# Patient Record
Sex: Female | Born: 1985 | Race: Black or African American | Hispanic: No | Marital: Married | Smoking: Never smoker
Health system: Southern US, Community
[De-identification: ages and names within clinical notes are randomized; demographics above are authoritative.]

## PROBLEM LIST (undated history)

## (undated) DIAGNOSIS — B379 Candidiasis, unspecified: Secondary | ICD-10-CM

## (undated) DIAGNOSIS — E119 Type 2 diabetes mellitus without complications: Secondary | ICD-10-CM

## (undated) DIAGNOSIS — N760 Acute vaginitis: Secondary | ICD-10-CM

## (undated) DIAGNOSIS — Z862 Personal history of diseases of the blood and blood-forming organs and certain disorders involving the immune mechanism: Secondary | ICD-10-CM

## (undated) DIAGNOSIS — K219 Gastro-esophageal reflux disease without esophagitis: Secondary | ICD-10-CM

## (undated) DIAGNOSIS — J189 Pneumonia, unspecified organism: Secondary | ICD-10-CM

## (undated) DIAGNOSIS — R112 Nausea with vomiting, unspecified: Secondary | ICD-10-CM

## (undated) DIAGNOSIS — B9620 Unspecified Escherichia coli [E. coli] as the cause of diseases classified elsewhere: Secondary | ICD-10-CM

## (undated) DIAGNOSIS — Q059 Spina bifida, unspecified: Secondary | ICD-10-CM

## (undated) DIAGNOSIS — N920 Excessive and frequent menstruation with regular cycle: Secondary | ICD-10-CM

## (undated) DIAGNOSIS — L409 Psoriasis, unspecified: Secondary | ICD-10-CM

## (undated) DIAGNOSIS — G7000 Myasthenia gravis without (acute) exacerbation: Secondary | ICD-10-CM

## (undated) DIAGNOSIS — Z87440 Personal history of urinary (tract) infections: Secondary | ICD-10-CM

## (undated) DIAGNOSIS — N189 Chronic kidney disease, unspecified: Secondary | ICD-10-CM

## (undated) DIAGNOSIS — N390 Urinary tract infection, site not specified: Secondary | ICD-10-CM

## (undated) DIAGNOSIS — I10 Essential (primary) hypertension: Secondary | ICD-10-CM

## (undated) DIAGNOSIS — A599 Trichomoniasis, unspecified: Secondary | ICD-10-CM

## (undated) DIAGNOSIS — B9689 Other specified bacterial agents as the cause of diseases classified elsewhere: Secondary | ICD-10-CM

## (undated) DIAGNOSIS — N938 Other specified abnormal uterine and vaginal bleeding: Secondary | ICD-10-CM

## (undated) DIAGNOSIS — N898 Other specified noninflammatory disorders of vagina: Secondary | ICD-10-CM

## (undated) DIAGNOSIS — Z9889 Other specified postprocedural states: Secondary | ICD-10-CM

## (undated) DIAGNOSIS — E876 Hypokalemia: Secondary | ICD-10-CM

## (undated) HISTORY — DX: Other specified abnormal uterine and vaginal bleeding: N93.8

## (undated) HISTORY — DX: Gastro-esophageal reflux disease without esophagitis: K21.9

## (undated) HISTORY — DX: Unspecified Escherichia coli (E. coli) as the cause of diseases classified elsewhere: B96.20

## (undated) HISTORY — PX: ADENOIDECTOMY: SUR15

## (undated) HISTORY — PX: EYE SURGERY: SHX253

## (undated) HISTORY — DX: Spina bifida, unspecified: Q05.9

## (undated) HISTORY — DX: Urinary tract infection, site not specified: N39.0

## (undated) HISTORY — DX: Trichomoniasis, unspecified: A59.9

## (undated) HISTORY — DX: Other specified noninflammatory disorders of vagina: N89.8

## (undated) HISTORY — DX: Essential (primary) hypertension: I10

## (undated) HISTORY — PX: DILATION AND CURETTAGE OF UTERUS: SHX78

## (undated) HISTORY — PX: BACK SURGERY: SHX140

## (undated) HISTORY — DX: Acute vaginitis: N76.0

## (undated) HISTORY — DX: Other specified bacterial agents as the cause of diseases classified elsewhere: B96.89

## (undated) HISTORY — DX: Excessive and frequent menstruation with regular cycle: N92.0

## (undated) HISTORY — PX: FOOT SURGERY: SHX648

## (undated) HISTORY — DX: Hypokalemia: E87.6

## (undated) HISTORY — DX: Candidiasis, unspecified: B37.9

## (undated) HISTORY — DX: Personal history of diseases of the blood and blood-forming organs and certain disorders involving the immune mechanism: Z86.2

## (undated) HISTORY — PX: TONSILLECTOMY: SUR1361

---

## 2006-03-02 ENCOUNTER — Ambulatory Visit: Payer: Self-pay | Admitting: Podiatry

## 2008-02-10 ENCOUNTER — Ambulatory Visit: Payer: Self-pay | Admitting: Internal Medicine

## 2008-05-23 ENCOUNTER — Emergency Department: Payer: Self-pay | Admitting: Unknown Physician Specialty

## 2008-05-24 ENCOUNTER — Emergency Department: Payer: Self-pay | Admitting: Emergency Medicine

## 2009-06-05 ENCOUNTER — Emergency Department: Payer: Self-pay | Admitting: Unknown Physician Specialty

## 2009-07-18 ENCOUNTER — Emergency Department: Payer: Self-pay | Admitting: Emergency Medicine

## 2010-12-06 DIAGNOSIS — G7000 Myasthenia gravis without (acute) exacerbation: Secondary | ICD-10-CM | POA: Insufficient documentation

## 2011-05-15 DIAGNOSIS — I10 Essential (primary) hypertension: Secondary | ICD-10-CM | POA: Insufficient documentation

## 2011-10-02 DIAGNOSIS — E559 Vitamin D deficiency, unspecified: Secondary | ICD-10-CM | POA: Insufficient documentation

## 2012-12-25 HISTORY — PX: HYSTEROSCOPY WITH D & C: SHX1775

## 2013-02-24 ENCOUNTER — Ambulatory Visit: Payer: Self-pay

## 2013-02-24 LAB — COMPREHENSIVE METABOLIC PANEL
Albumin: 2.9 g/dL — ABNORMAL LOW (ref 3.4–5.0)
Alkaline Phosphatase: 68 U/L (ref 50–136)
Anion Gap: 9 (ref 7–16)
BUN: 13 mg/dL (ref 7–18)
Bilirubin,Total: 0.8 mg/dL (ref 0.2–1.0)
Co2: 30 mmol/L (ref 21–32)
EGFR (Non-African Amer.): >60
Glucose: 136 mg/dL — ABNORMAL HIGH (ref 65–99)
Potassium: 2.2 mmol/L — CL (ref 3.5–5.1)
SGOT(AST): 13 U/L — ABNORMAL LOW (ref 15–37)
Sodium: 132 mmol/L — ABNORMAL LOW (ref 136–145)

## 2013-02-24 LAB — CBC WITH DIFFERENTIAL/PLATELET
Lymphocytes: 7 %
MCHC: 32.5 g/dL (ref 32.0–36.0)
Monocytes: 13 %
Platelet: 270 10*3/uL (ref 150–440)
RDW: 14.3 % (ref 11.5–14.5)
Segmented Neutrophils: 80 %
WBC: 17.4 10*3/uL — ABNORMAL HIGH (ref 3.6–11.0)

## 2013-02-24 LAB — URINALYSIS, COMPLETE
Bilirubin,UR: NEGATIVE
Ketone: NEGATIVE
Specific Gravity: 1.01 (ref 1.003–1.030)

## 2013-02-24 LAB — PREGNANCY, URINE: Pregnancy Test, Urine: NEGATIVE m[IU]/mL

## 2013-02-26 LAB — URINE CULTURE

## 2013-05-06 ENCOUNTER — Emergency Department: Payer: Self-pay | Admitting: Emergency Medicine

## 2013-06-25 ENCOUNTER — Emergency Department: Payer: Self-pay | Admitting: Emergency Medicine

## 2013-06-25 LAB — BASIC METABOLIC PANEL
Anion Gap: 7 (ref 7–16)
BUN: 11 mg/dL (ref 7–18)
Calcium, Total: 9 mg/dL (ref 8.5–10.1)
Creatinine: 0.92 mg/dL (ref 0.60–1.30)
EGFR (African American): >60
EGFR (Non-African Amer.): >60
Glucose: 223 mg/dL — ABNORMAL HIGH (ref 65–99)
Potassium: 3.1 mmol/L — ABNORMAL LOW (ref 3.5–5.1)

## 2013-06-25 LAB — CBC
HGB: 12.6 g/dL (ref 12.0–16.0)
MCHC: 32.2 g/dL (ref 32.0–36.0)
MCV: 74 fL — ABNORMAL LOW (ref 80–100)
Platelet: 434 10*3/uL (ref 150–440)
RDW: 15.5 % — ABNORMAL HIGH (ref 11.5–14.5)
WBC: 12.4 10*3/uL — ABNORMAL HIGH (ref 3.6–11.0)

## 2013-06-25 LAB — URINALYSIS, COMPLETE
Bilirubin,UR: NEGATIVE
Glucose,UR: >=500 mg/dL (ref 0–75)
Nitrite: NEGATIVE
Ph: 7 (ref 4.5–8.0)
RBC,UR: 1 /HPF (ref 0–5)
Squamous Epithelial: 2
WBC UR: 5 /HPF (ref 0–5)

## 2013-06-25 LAB — PROTIME-INR
INR: 1
Prothrombin Time: 13.3 secs (ref 11.5–14.7)

## 2013-08-12 DIAGNOSIS — K5900 Constipation, unspecified: Secondary | ICD-10-CM | POA: Insufficient documentation

## 2013-08-12 DIAGNOSIS — Q059 Spina bifida, unspecified: Secondary | ICD-10-CM | POA: Insufficient documentation

## 2013-08-21 ENCOUNTER — Ambulatory Visit: Payer: Self-pay | Admitting: Obstetrics and Gynecology

## 2013-08-21 LAB — BASIC METABOLIC PANEL
BUN: 12 mg/dL (ref 7–18)
Co2: 29 mmol/L (ref 21–32)
Creatinine: 0.8 mg/dL (ref 0.60–1.30)
Glucose: 117 mg/dL — ABNORMAL HIGH (ref 65–99)
Osmolality: 273 (ref 275–301)
Potassium: 3 mmol/L — ABNORMAL LOW (ref 3.5–5.1)
Sodium: 136 mmol/L (ref 136–145)

## 2013-08-21 LAB — CBC
HGB: 12.7 g/dL (ref 12.0–16.0)
MCHC: 32.9 g/dL (ref 32.0–36.0)
MCV: 76 fL — ABNORMAL LOW (ref 80–100)
RBC: 5.05 10*6/uL (ref 3.80–5.20)
RDW: 17 % — ABNORMAL HIGH (ref 11.5–14.5)

## 2013-08-21 LAB — HCG, QUANTITATIVE, PREGNANCY: Beta Hcg, Quant.: <1 m[IU]/mL — ABNORMAL LOW

## 2013-09-08 ENCOUNTER — Ambulatory Visit: Payer: Self-pay | Admitting: Obstetrics and Gynecology

## 2013-09-08 LAB — URINALYSIS, COMPLETE
Ph: 5 (ref 4.5–8.0)
Protein: NEGATIVE
RBC,UR: 12 /HPF (ref 0–5)
Specific Gravity: 1.015 (ref 1.003–1.030)
Squamous Epithelial: 24

## 2013-09-10 LAB — URINE CULTURE

## 2013-09-11 LAB — PATHOLOGY REPORT

## 2013-10-08 ENCOUNTER — Emergency Department: Payer: Self-pay | Admitting: Emergency Medicine

## 2013-11-10 ENCOUNTER — Emergency Department: Payer: Self-pay | Admitting: Emergency Medicine

## 2014-01-19 DIAGNOSIS — F32A Depression, unspecified: Secondary | ICD-10-CM | POA: Insufficient documentation

## 2014-01-19 DIAGNOSIS — F329 Major depressive disorder, single episode, unspecified: Secondary | ICD-10-CM | POA: Insufficient documentation

## 2014-01-19 DIAGNOSIS — R591 Generalized enlarged lymph nodes: Secondary | ICD-10-CM | POA: Insufficient documentation

## 2014-01-19 DIAGNOSIS — R599 Enlarged lymph nodes, unspecified: Secondary | ICD-10-CM | POA: Insufficient documentation

## 2014-02-04 ENCOUNTER — Ambulatory Visit: Payer: Self-pay | Admitting: Podiatry

## 2014-03-14 ENCOUNTER — Emergency Department: Payer: Self-pay | Admitting: Emergency Medicine

## 2014-03-14 LAB — URINALYSIS, COMPLETE
Bilirubin,UR: NEGATIVE
Glucose,UR: 150 mg/dL (ref 0–75)
KETONE: NEGATIVE
Nitrite: POSITIVE
Ph: 5 (ref 4.5–8.0)
RBC,UR: 81 /HPF (ref 0–5)
Specific Gravity: 1.018 (ref 1.003–1.030)
Squamous Epithelial: 13
WBC UR: 687 /HPF (ref 0–5)

## 2014-03-17 DIAGNOSIS — E119 Type 2 diabetes mellitus without complications: Secondary | ICD-10-CM | POA: Insufficient documentation

## 2014-04-18 ENCOUNTER — Emergency Department: Payer: Self-pay | Admitting: Emergency Medicine

## 2014-04-18 LAB — URINALYSIS, COMPLETE
Bilirubin,UR: NEGATIVE
Glucose,UR: >=500 mg/dL (ref 0–75)
Nitrite: NEGATIVE
PH: 6 (ref 4.5–8.0)
Protein: 30
RBC,UR: 12 /HPF (ref 0–5)
SPECIFIC GRAVITY: 1.009 (ref 1.003–1.030)
Squamous Epithelial: 4

## 2014-04-18 LAB — CBC
HCT: 42.9 % (ref 35.0–47.0)
HGB: 14 g/dL (ref 12.0–16.0)
MCH: 26 pg (ref 26.0–34.0)
MCHC: 32.8 g/dL (ref 32.0–36.0)
MCV: 80 fL (ref 80–100)
PLATELETS: 219 10*3/uL (ref 150–440)
RBC: 5.39 10*6/uL — ABNORMAL HIGH (ref 3.80–5.20)
RDW: 13.3 % (ref 11.5–14.5)
WBC: 11.3 10*3/uL — AB (ref 3.6–11.0)

## 2014-04-18 LAB — BASIC METABOLIC PANEL
ANION GAP: 10 (ref 7–16)
BUN: 8 mg/dL (ref 7–18)
CALCIUM: 8.9 mg/dL (ref 8.5–10.1)
CREATININE: 0.94 mg/dL (ref 0.60–1.30)
Chloride: 88 mmol/L — ABNORMAL LOW (ref 98–107)
Co2: 31 mmol/L (ref 21–32)
EGFR (African American): >60
EGFR (Non-African Amer.): >60
GLUCOSE: 269 mg/dL — AB (ref 65–99)
Osmolality: 267 (ref 275–301)
Potassium: 2.6 mmol/L — ABNORMAL LOW (ref 3.5–5.1)
SODIUM: 129 mmol/L — AB (ref 136–145)

## 2014-04-20 DIAGNOSIS — Z79899 Other long term (current) drug therapy: Secondary | ICD-10-CM | POA: Insufficient documentation

## 2014-04-20 DIAGNOSIS — Z7952 Long term (current) use of systemic steroids: Secondary | ICD-10-CM | POA: Insufficient documentation

## 2014-04-20 LAB — URINE CULTURE

## 2014-07-08 ENCOUNTER — Ambulatory Visit: Payer: Self-pay | Admitting: Otolaryngology

## 2014-08-03 ENCOUNTER — Emergency Department: Payer: Self-pay | Admitting: Emergency Medicine

## 2014-08-03 LAB — URINALYSIS, COMPLETE
Bilirubin,UR: NEGATIVE
Hyaline Cast: 2
Nitrite: POSITIVE
Ph: 5 (ref 4.5–8.0)
Protein: NEGATIVE
Specific Gravity: 1.019 (ref 1.003–1.030)
Squamous Epithelial: 4
WBC UR: 31 /HPF (ref 0–5)

## 2014-08-03 LAB — COMPREHENSIVE METABOLIC PANEL
Albumin: 3.3 g/dL — ABNORMAL LOW (ref 3.4–5.0)
Alkaline Phosphatase: 62 U/L
Anion Gap: 13 (ref 7–16)
BUN: 11 mg/dL (ref 7–18)
Bilirubin,Total: 0.3 mg/dL (ref 0.2–1.0)
CHLORIDE: 93 mmol/L — AB (ref 98–107)
Calcium, Total: 9 mg/dL (ref 8.5–10.1)
Co2: 27 mmol/L (ref 21–32)
Creatinine: 0.98 mg/dL (ref 0.60–1.30)
EGFR (African American): >60
EGFR (Non-African Amer.): >60
Glucose: 293 mg/dL — ABNORMAL HIGH (ref 65–99)
Osmolality: 277 (ref 275–301)
POTASSIUM: 2.8 mmol/L — AB (ref 3.5–5.1)
SGOT(AST): 20 U/L (ref 15–37)
SGPT (ALT): 17 U/L
Sodium: 133 mmol/L — ABNORMAL LOW (ref 136–145)
Total Protein: 7.9 g/dL (ref 6.4–8.2)

## 2014-08-03 LAB — CBC
HCT: 42 % (ref 35.0–47.0)
HGB: 13.7 g/dL (ref 12.0–16.0)
MCH: 26.3 pg (ref 26.0–34.0)
MCHC: 32.6 g/dL (ref 32.0–36.0)
MCV: 81 fL (ref 80–100)
Platelet: 348 10*3/uL (ref 150–440)
RBC: 5.21 10*6/uL — ABNORMAL HIGH (ref 3.80–5.20)
RDW: 12.4 % (ref 11.5–14.5)
WBC: 16.5 10*3/uL — ABNORMAL HIGH (ref 3.6–11.0)

## 2014-08-04 ENCOUNTER — Emergency Department: Payer: Self-pay | Admitting: Emergency Medicine

## 2014-08-05 LAB — URINE CULTURE

## 2014-10-05 ENCOUNTER — Emergency Department: Payer: Self-pay | Admitting: Emergency Medicine

## 2014-10-05 LAB — URINALYSIS, COMPLETE
BILIRUBIN, UR: NEGATIVE
Glucose,UR: NEGATIVE mg/dL (ref 0–75)
Ketone: NEGATIVE
Nitrite: POSITIVE
Ph: 6 (ref 4.5–8.0)
RBC,UR: 21 /HPF (ref 0–5)
Specific Gravity: 1.013 (ref 1.003–1.030)
Squamous Epithelial: 2
WBC UR: 1531 /HPF (ref 0–5)

## 2014-10-07 LAB — URINE CULTURE

## 2014-11-26 LAB — HM PAP SMEAR: HM Pap smear: NEGATIVE

## 2014-12-06 ENCOUNTER — Emergency Department: Payer: Self-pay | Admitting: Emergency Medicine

## 2014-12-06 LAB — URINALYSIS, COMPLETE
Bilirubin,UR: NEGATIVE
GLUCOSE, UR: NEGATIVE mg/dL (ref 0–75)
Ketone: NEGATIVE
Nitrite: POSITIVE
PH: 5 (ref 4.5–8.0)
PROTEIN: NEGATIVE
RBC,UR: 37 /HPF (ref 0–5)
Specific Gravity: 1.015 (ref 1.003–1.030)
Squamous Epithelial: 2
WBC UR: 1202 /HPF (ref 0–5)

## 2014-12-06 LAB — CBC WITH DIFFERENTIAL/PLATELET
BASOS ABS: 0.2 10*3/uL — AB (ref 0.0–0.1)
BASOS PCT: 0.9 %
EOS PCT: 2.2 %
Eosinophil #: 0.3 10*3/uL (ref 0.0–0.7)
HCT: 41.6 % (ref 35.0–47.0)
HGB: 13.5 g/dL (ref 12.0–16.0)
LYMPHS ABS: 4.3 10*3/uL — AB (ref 1.0–3.6)
LYMPHS PCT: 26.8 %
MCH: 26.2 pg (ref 26.0–34.0)
MCHC: 32.4 g/dL (ref 32.0–36.0)
MCV: 81 fL (ref 80–100)
Monocyte #: 1 x10 3/mm — ABNORMAL HIGH (ref 0.2–0.9)
Monocyte %: 6.4 %
Neutrophil #: 10.2 10*3/uL — ABNORMAL HIGH (ref 1.4–6.5)
Neutrophil %: 63.7 %
PLATELETS: 406 10*3/uL (ref 150–440)
RBC: 5.14 10*6/uL (ref 3.80–5.20)
RDW: 13.2 % (ref 11.5–14.5)
WBC: 16 10*3/uL — AB (ref 3.6–11.0)

## 2014-12-06 LAB — BASIC METABOLIC PANEL
Anion Gap: 13 (ref 7–16)
BUN: 12 mg/dL (ref 7–18)
CALCIUM: 8.3 mg/dL — AB (ref 8.5–10.1)
CHLORIDE: 96 mmol/L — AB (ref 98–107)
CREATININE: 0.97 mg/dL (ref 0.60–1.30)
Co2: 27 mmol/L (ref 21–32)
EGFR (African American): >60
EGFR (Non-African Amer.): >60
Glucose: 235 mg/dL — ABNORMAL HIGH (ref 65–99)
Osmolality: 279 (ref 275–301)
Potassium: 2.8 mmol/L — ABNORMAL LOW (ref 3.5–5.1)
Sodium: 136 mmol/L (ref 136–145)

## 2014-12-10 LAB — URINE CULTURE

## 2015-01-02 ENCOUNTER — Emergency Department: Payer: Self-pay | Admitting: Emergency Medicine

## 2015-01-02 LAB — BASIC METABOLIC PANEL
Anion Gap: 10 (ref 7–16)
BUN: 9 mg/dL (ref 7–18)
Calcium, Total: 9.6 mg/dL (ref 8.5–10.1)
Chloride: 95 mmol/L — ABNORMAL LOW (ref 98–107)
Co2: 27 mmol/L (ref 21–32)
Creatinine: 0.91 mg/dL (ref 0.60–1.30)
EGFR (African American): >60
GLUCOSE: 296 mg/dL — AB (ref 65–99)
Osmolality: 274 (ref 275–301)
POTASSIUM: 4.1 mmol/L (ref 3.5–5.1)
SODIUM: 132 mmol/L — AB (ref 136–145)

## 2015-01-02 LAB — CBC WITH DIFFERENTIAL/PLATELET
BASOS ABS: 0.1 10*3/uL (ref 0.0–0.1)
Basophil %: 0.9 %
EOS ABS: 0 10*3/uL (ref 0.0–0.7)
Eosinophil %: 0.1 %
HCT: 43.4 % (ref 35.0–47.0)
HGB: 13.8 g/dL (ref 12.0–16.0)
LYMPHS PCT: 12.3 %
Lymphocyte #: 1.8 10*3/uL (ref 1.0–3.6)
MCH: 26.1 pg (ref 26.0–34.0)
MCHC: 31.8 g/dL — AB (ref 32.0–36.0)
MCV: 82 fL (ref 80–100)
MONO ABS: 0.4 x10 3/mm (ref 0.2–0.9)
Monocyte %: 2.5 %
NEUTROS PCT: 84.2 %
Neutrophil #: 12.5 10*3/uL — ABNORMAL HIGH (ref 1.4–6.5)
PLATELETS: 388 10*3/uL (ref 150–440)
RBC: 5.29 10*6/uL — AB (ref 3.80–5.20)
RDW: 13.6 % (ref 11.5–14.5)
WBC: 14.9 10*3/uL — AB (ref 3.6–11.0)

## 2015-03-15 DIAGNOSIS — E139 Other specified diabetes mellitus without complications: Secondary | ICD-10-CM | POA: Insufficient documentation

## 2015-04-16 NOTE — Op Note (Signed)
PATIENT NAME:  [name], Lacee D MR#:  [id number] DATE OF BIRTH:  10/29/1986  DATE OF PROCEDURE:  09/08/2013  PREOPERATIVE DIAGNOSES: 1.  Abnormal uterine bleeding.  2.  Suspected endometrial polyps on ultrasound.  3.  Urinary tract infection.   POSTOPERATIVE DIAGNOSES: 1.  Abnormal uterine bleeding.  2.  Endometrial polyps.  3.  Urinary tract infection.   OPERATIVE PROCEDURES: 1.  Hysteroscopy with D and C.   SURGEON: Dr. Greggory KeeneFrancesco.  FIRST ASSISTANT: None.   ANESTHESIA: General.   INDICATIONS: The patient is a 29 year old single African American female, para zero, using birth control pills for contraception, who presents for evaluation of chronic abnormal uterine bleeding that has been refractory to medical therapy. Preoperative ultrasound did demonstrate finding suspicious for endometrial polyps. The patient did have UTI symptoms and urinalysis preoperatively demonstrated UTI with positive nitrites and moderate amount of white blood cells in the urine specimen.   DESCRIPTION OF PROCEDURE: The patient was brought to the operating room where she does the supine position. General anesthesia was induced without difficulty.  She was placed in the dorsal lithotomy position using the candy cane stirrups. A Betadine perineal, intravaginal prep and drape was performed in the standard fashion. A weighted speculum was placed to the vagina and a single-tooth tenaculum was placed on the anterior lip of the cervix. The uterus was sounded to 9 cm. The Hanks dilators were used up to a #22-French caliber to dilate the endocervical canal. Both smooth and serrated curettes were then used to perform the curettage following the hysteroscopy that was performed with the [facility] hysteroscope using lactated Ringer's as irrigant. The above-noted findings were photo documented. Curettage produced a moderate amount of tissue. Repeat hysteroscopy post procedure demonstrated no significant residual tissue left behind. The  procedure was then terminated with all instrumentation being removed from the vagina. The patient was then awakened, mobilized and taken to the recovery room in satisfactory condition.   ESTIMATED BLOOD LOSS: Minimal.   COMPLICATIONS:  None.   COUNTS:  All instruments, needle and sponge counts were verified as correct.     ____________________________ Prentice DockerMartin A. Jessenya Berdan, MD mad:dmm D: 09/08/2013 18:14:21 ET T: 09/08/2013 19:24:10 ET JOB#: [id number]  cc: Daphine DeutscherMartin A. Kahlen Morais, MD, <Dictator> [facility] Prentice DockerMARTIN A Faryn Sieg MD ELECTRONICALLY SIGNED 10/04/2013 14:33

## 2015-05-17 DIAGNOSIS — J301 Allergic rhinitis due to pollen: Secondary | ICD-10-CM | POA: Insufficient documentation

## 2015-05-17 DIAGNOSIS — IMO0001 Reserved for inherently not codable concepts without codable children: Secondary | ICD-10-CM | POA: Insufficient documentation

## 2015-07-01 ENCOUNTER — Telehealth: Payer: Self-pay | Admitting: Obstetrics and Gynecology

## 2015-07-01 MED ORDER — NORETHIN ACE-ETH ESTRAD-FE 1-20 MG-MCG PO TABS: 1.0000 | ORAL_TABLET | Freq: Every day | ORAL | Status: DC

## 2015-07-01 NOTE — Telephone Encounter (Signed)
Pt has transportation issues and used a pack of pills that she had lying around the house. She did not miss any pills but thinks she did not start on the right week. She has since picked up her rx and has started her correct pack/ocp. Advised to throw all pills away that she has lying around. Will erx a 90 day supply which may help with her transportation issues. Advised pt she may have some BTB or spotting but to take pills qd and same time qd. Pt voices understanding.

## 2015-07-01 NOTE — Telephone Encounter (Signed)
ACCIDENTALLY TOOK BC WRONG/ NEEDS TO KNOW WHAT TO DO.

## 2015-08-04 ENCOUNTER — Emergency Department
Admission: EM | Admit: 2015-08-04 | Discharge: 2015-08-04 | Disposition: A | Discharge status: Home / Self Care | Payer: Medicaid Other | Attending: Emergency Medicine | Admitting: Emergency Medicine

## 2015-08-04 ENCOUNTER — Encounter: Payer: Self-pay | Admitting: Emergency Medicine

## 2015-08-04 DIAGNOSIS — R51 Headache: Secondary | ICD-10-CM | POA: Diagnosis not present

## 2015-08-04 DIAGNOSIS — Z793 Long term (current) use of hormonal contraceptives: Secondary | ICD-10-CM | POA: Insufficient documentation

## 2015-08-04 DIAGNOSIS — E119 Type 2 diabetes mellitus without complications: Secondary | ICD-10-CM | POA: Diagnosis not present

## 2015-08-04 DIAGNOSIS — Z3202 Encounter for pregnancy test, result negative: Secondary | ICD-10-CM | POA: Insufficient documentation

## 2015-08-04 DIAGNOSIS — Z880 Allergy status to penicillin: Secondary | ICD-10-CM | POA: Diagnosis not present

## 2015-08-04 DIAGNOSIS — R509 Fever, unspecified: Secondary | ICD-10-CM | POA: Diagnosis present

## 2015-08-04 DIAGNOSIS — N390 Urinary tract infection, site not specified: Secondary | ICD-10-CM | POA: Diagnosis not present

## 2015-08-04 HISTORY — DX: Type 2 diabetes mellitus without complications: E11.9

## 2015-08-04 HISTORY — DX: Myasthenia gravis without (acute) exacerbation: G70.00

## 2015-08-04 HISTORY — DX: Personal history of urinary (tract) infections: Z87.440

## 2015-08-04 HISTORY — DX: Psoriasis, unspecified: L40.9

## 2015-08-04 LAB — URINALYSIS COMPLETE WITH MICROSCOPIC (ARMC ONLY)
Bilirubin Urine: NEGATIVE
Glucose, UA: >500 mg/dL — AB
Nitrite: POSITIVE — AB
PH: 5 (ref 5.0–8.0)
PROTEIN: NEGATIVE mg/dL
Specific Gravity, Urine: 1.012 (ref 1.005–1.030)

## 2015-08-04 LAB — POCT PREGNANCY, URINE: PREG TEST UR: NEGATIVE

## 2015-08-04 LAB — GLUCOSE, CAPILLARY: GLUCOSE-CAPILLARY: 290 mg/dL — AB (ref 65–99)

## 2015-08-04 MED ORDER — HYDROCODONE-ACETAMINOPHEN 5-325 MG PO TABS
ORAL_TABLET | ORAL | Status: AC
  Administered 2015-08-04: 2 via ORAL
  Filled 2015-08-04: qty 2

## 2015-08-04 MED ORDER — CEPHALEXIN 500 MG PO CAPS: 500.0000 mg | ORAL_CAPSULE | Freq: Three times a day (TID) | ORAL | Status: DC

## 2015-08-04 MED ORDER — CEPHALEXIN 500 MG PO CAPS
500.0000 mg | ORAL_CAPSULE | ORAL | Status: AC
  Administered 2015-08-04: 500 mg via ORAL
  Filled 2015-08-04: qty 1

## 2015-08-04 MED ORDER — HYDROCODONE-ACETAMINOPHEN 5-325 MG PO TABS
2.0000 | ORAL_TABLET | Freq: Once | ORAL | Status: AC
  Administered 2015-08-04: 2 via ORAL

## 2015-08-04 NOTE — ED Notes (Signed)
Pt now is stating that she would like her urine checked, stating "it's orange and I think I might have a urinary tract infection"; st she self-caths at home for CP and has frequent UTI's; pt also requesting a FSBS as well

## 2015-08-04 NOTE — ED Provider Notes (Signed)
[facility] Emergency Department Provider Note  ____________________________________________  Time seen: Approximately 8:56 PM  I have reviewed the triage vital signs and the nursing notes.   HISTORY  Chief Complaint Fever    HPI [name] is a 29 y.o. female with an extensive past medical history that includes cerebral palsy requiring self catheterization for urine, myasthenia gravis on chronic prednisone, psoriasis, and more,who presents with low-grade fever, or urine, and feeling "off" starting today.  She states that she feels like this sometimes when she gets a urinary tract infection.  All of her doctors are in [place] and she has had discussions with a urologist in the past about her recurrent urinary tract infections.  She has not been on any antibiotics for some time.  She of catheters because of cerebral palsy and she also takes chronic prednisone for myasthenia gravis.  She denies chest pain, shortness of breath, abdominal pain.  She does endorse a severe headache at this time which developed gradually and is global.  She denies any visual changes.   Past Medical History  Diagnosis Date  . Myasthenia gravis   . Cerebral palsy   . Diabetes mellitus without complication   . H/O recurrent urinary tract infection   . Psoriasis     There are no active problems to display for this patient.   History reviewed. No pertinent past surgical history.  Current Outpatient Rx  Name  Route  Sig  Dispense  Refill  . cephALEXin (KEFLEX) 500 MG capsule   Oral   Take 1 capsule (500 mg total) by mouth 3 (three) times daily.   36 capsule   0   . norethindrone-ethinyl estradiol (LOESTRIN FE 1/20) 1-20 MG-MCG tablet   Oral   Take 1 tablet by mouth daily.   3 Package   1     Allergies Penicillins  History reviewed. No pertinent family history.  Social History Social History  Substance Use Topics  . Smoking status: Never Smoker   .  Smokeless tobacco: None  . Alcohol Use: No    Review of Systems Constitutional: Subjective fever and chills and feeling just generally "off" Eyes: No visual changes. ENT: No sore throat. Cardiovascular: Denies chest pain. Respiratory: Denies shortness of breath. Gastrointestinal: No abdominal pain.  No nausea, no vomiting.  No diarrhea.  No constipation. Genitourinary: Negative for dysuria. Musculoskeletal: Negative for back pain. Skin: Negative for rash. Neurological: Gradual onset of severe headache 10-point ROS otherwise negative.  ____________________________________________   PHYSICAL EXAM:  VITAL SIGNS: ED Triage Vitals  Enc Vitals Group     BP 08/04/15 1923 139/90 mmHg     Pulse Rate 08/04/15 1923 112     Resp 08/04/15 1923 18     Temp 08/04/15 1923 99.7 F (37.6 C)     Temp Source 08/04/15 1923 Oral     SpO2 08/04/15 1923 100 %     Weight 08/04/15 1923 200 lb (90.719 kg)     Height 08/04/15 1923 5\' 1"  (1.549 m)     Head Cir --      Peak Flow --      Pain Score 08/04/15 1926 7     Pain Loc --      Pain Edu? --      Excl. in GC? --     Constitutional: Alert and oriented. Well appearing and in no acute distress.  Smiling, laughing, and interacting appropriately with me Eyes: Conjunctivae are normal. PERRL. EOMI. Head: Atraumatic. Nose:  No congestion/rhinnorhea. Mouth/Throat: Mucous membranes are moist.  Oropharynx non-erythematous. Neck: No stridor.   Cardiovascular: Normal rate, regular rhythm. Grossly normal heart sounds.  Good peripheral circulation. Respiratory: Normal respiratory effort.  No retractions. Lungs CTAB. Gastrointestinal: Obese, Soft and nontender. No distention. No abdominal bruits. No CVA tenderness. Musculoskeletal: No lower extremity tenderness nor edema.  No joint effusions. Neurologic:  Normal speech and language. No gross focal neurologic deficits are appreciated.  Skin:  Skin is warm dry and intact. No rash noted. Psychiatric: Mood  and affect are normal. Speech and behavior are normal.  ____________________________________________   LABS (all labs ordered are listed but only abnormal results are displayed)  Labs Reviewed  URINALYSIS COMPLETEWITH MICROSCOPIC ([facility] ONLY) - Abnormal; Notable for the following:    Color, Urine YELLOW (*)    APPearance HAZY (*)    Glucose, UA >500 (*)    Ketones, ur TRACE (*)    Hgb urine dipstick 2+ (*)    Nitrite POSITIVE (*)    Leukocytes, UA TRACE (*)    Bacteria, UA RARE (*)    Squamous Epithelial / LPF 0-5 (*)    All other components within normal limits  GLUCOSE, CAPILLARY - Abnormal; Notable for the following:    Glucose-Capillary 290 (*)    All other components within normal limits  URINE CULTURE  CBG MONITORING, ED  POCT PREGNANCY, URINE   ____________________________________________  EKG  Not indicated ____________________________________________  RADIOLOGY  Not indicated  ____________________________________________   PROCEDURES  Procedure(s) performed: None  Critical Care performed: No ____________________________________________   INITIAL IMPRESSION / ASSESSMENT AND PLAN / ED COURSE  Pertinent labs & imaging results that were available during my care of the patient were reviewed by me and considered in my medical decision making (see chart for details).  The patient is well-appearing and in no acute distress with no acute physical exam findings.  She has slight tachycardia and a low-grade fever.  She has a mild urinary tract infection is certainly at high risk for developing them given her catheterizations and her chronic immunosuppression.  We discussed obtaining blood work, but I explained that I did not feel it would be particularly helpful since I would anticipate a leukocytosis given her prednisone use and her infection.  She understands and agrees.  I gave her first dose of Keflex 500 mg by mouth, had her urine sent a culture, provided a  prescription for Keflex 500 mg 3 times daily 12 days, and advised her to call her doctor in [place] to schedule the next available follow-up appointment.  I gave her my usual customary return precautions, and she understands and agrees with the plan.  ____________________________________________  FINAL CLINICAL IMPRESSION(S) / ED DIAGNOSES  Final diagnoses:  UTI (lower urinary tract infection)      NEW MEDICATIONS STARTED DURING THIS VISIT:  Discharge Medication List as of 08/04/2015  9:21 PM    START taking these medications   Details  cephALEXin (KEFLEX) 500 MG capsule Take 1 capsule (500 mg total) by mouth 3 (three) times daily., Starting 08/04/2015, Until Discontinued, Print         Loleta Rose, MD 08/04/15 2158

## 2015-08-04 NOTE — Discharge Instructions (Signed)
As we discussed, he will have what appears to be a mild urinary tract infection.  We sent her urine to culture to see specifically what organisms are involved.  We gave her first dose of medication in the emergency department and recommend he take the full course of treatment as prescribed.  It is important, however, free to follow up with your urologist in [facility] given the number of medical problems with which ED on a daily basis and giving her need for self catheterization.  Please take over-the-counter Tylenol and/or ibuprofen as needed for pain and for fever.  Follow up as soon as possible with your doctor or return to the nearest emergency department if you develop new or worsening symptoms that concern you.   Urinary Tract Infection Urinary tract infections (UTIs) can develop anywhere along your urinary tract. Your urinary tract is your body's drainage system for removing wastes and extra water. Your urinary tract includes two kidneys, two ureters, a bladder, and a urethra. Your kidneys are a pair of bean-shaped organs. Each kidney is about the size of your fist. They are located below your ribs, one on each side of your spine. CAUSES Infections are caused by microbes, which are microscopic organisms, including fungi, viruses, and bacteria. These organisms are so small that they can only be seen through a microscope. Bacteria are the microbes that most commonly cause UTIs. SYMPTOMS  Symptoms of UTIs may vary by age and gender of the patient and by the location of the infection. Symptoms in young women typically include a frequent and intense urge to urinate and a painful, burning feeling in the bladder or urethra during urination. Older women and men are more likely to be tired, shaky, and weak and have muscle aches and abdominal pain. A fever may mean the infection is in your kidneys. Other symptoms of a kidney infection include pain in your back or sides below the ribs, nausea, and  vomiting. DIAGNOSIS To diagnose a UTI, your caregiver will ask you about your symptoms. Your caregiver also will ask to provide a urine sample. The urine sample will be tested for bacteria and white blood cells. White blood cells are made by your body to help fight infection. TREATMENT  Typically, UTIs can be treated with medication. Because most UTIs are caused by a bacterial infection, they usually can be treated with the use of antibiotics. The choice of antibiotic and length of treatment depend on your symptoms and the type of bacteria causing your infection. HOME CARE INSTRUCTIONS  If you were prescribed antibiotics, take them exactly as your caregiver instructs you. Finish the medication even if you feel better after you have only taken some of the medication.  Drink enough water and fluids to keep your urine clear or pale yellow.  Avoid caffeine, tea, and carbonated beverages. They tend to irritate your bladder.  Empty your bladder often. Avoid holding urine for long periods of time.  Empty your bladder before and after sexual intercourse.  After a bowel movement, women should cleanse from front to back. Use each tissue only once. SEEK MEDICAL CARE IF:   You have back pain.  You develop a fever.  Your symptoms do not begin to resolve within 3 days. SEEK IMMEDIATE MEDICAL CARE IF:   You have severe back pain or lower abdominal pain.  You develop chills.  You have nausea or vomiting.  You have continued burning or discomfort with urination. MAKE SURE YOU:   Understand these instructions.  Will watch your condition.  Will get help right away if you are not doing well or get worse. Document Released: 09/20/2005 Document Revised: 06/11/2012 Document Reviewed: 01/19/2012 [facility] Patient Information 2015 [place], [place]. This information is not intended to replace advice given to you by your health care provider. Make sure you discuss any questions you have with your health  care provider.

## 2015-08-04 NOTE — ED Notes (Addendum)
.  pt to triage via w/c with no distress noted; pt reports fever today with no recent illness; pt also c/o HA to temples with no accomp symptoms

## 2015-08-05 ENCOUNTER — Telehealth: Payer: Self-pay | Admitting: Emergency Medicine

## 2015-08-06 NOTE — ED Notes (Signed)
[facility] called and said pt preferred not to take cephalexin due to pcn allergy.  Per dr Cyril Loosen can change to cipro 500 mg twice daily for 7 days.   Called in to [facility]

## 2015-08-08 LAB — URINE CULTURE

## 2015-08-17 DIAGNOSIS — D649 Anemia, unspecified: Secondary | ICD-10-CM | POA: Insufficient documentation

## 2015-09-06 ENCOUNTER — Telehealth: Payer: Self-pay | Admitting: Obstetrics and Gynecology

## 2015-09-06 MED ORDER — NORETHIN ACE-ETH ESTRAD-FE 1-20 MG-MCG PO TABS: 1.0000 | ORAL_TABLET | Freq: Every day | ORAL | Status: DC

## 2015-09-06 NOTE — Telephone Encounter (Signed)
Pt aware ocp erx. 

## 2015-09-06 NOTE — Telephone Encounter (Signed)
SHE WENT TO GET HER BC AND THEY WILL NOT GIVE IT TO HER BECAUSE IT WAS A 90 DAYS SUPPLY, AND SHE THINKS SHE THROUGH AWAY A PACK OF BC BECAUSE SHE CAN NOT FIND IT, AND SHE WANTED TO KNOW IF WE COULD GIVE HER A SAMPLE OR CALL IN A SCRIPT FOR HER, BECAUSE SHE DOES NOT HAVE ANY, AND SHE TOOK HER LAST ONE YESTERDAY, AND SHE CAN NOT GO WITHOUT IT DUE TO WHAT DR DE TOLD HER.

## 2015-11-03 ENCOUNTER — Telehealth: Payer: Self-pay | Admitting: Obstetrics and Gynecology

## 2015-11-03 ENCOUNTER — Encounter: Payer: Self-pay | Admitting: Obstetrics and Gynecology

## 2015-11-03 NOTE — Telephone Encounter (Signed)
Pt states she had her lmp 10/27/2015. She has not  Missed any of her pills. Had some lite bleeding today. NO cramps. No IC in 3 weeks. She did state her urine smelled bad last week but she increased her h20 and cranberry juice. NO uti issues today. Pt advised to keep a menstrual calendar. Take pills same time qd. Keep scheduled ae on 12/13. Pt voices understanding.

## 2015-11-03 NOTE — Telephone Encounter (Signed)
Been on BC pills for more than a yr. After her reg. period she started spotting again. She would like for you to call her.

## 2015-12-07 ENCOUNTER — Encounter: Payer: Self-pay | Admitting: Obstetrics and Gynecology

## 2015-12-07 ENCOUNTER — Ambulatory Visit (INDEPENDENT_AMBULATORY_CARE_PROVIDER_SITE_OTHER): Payer: Medicaid Other | Admitting: Obstetrics and Gynecology

## 2015-12-07 VITALS — BP 137/83 | HR 96 | Ht 60.0 in | Wt 203.3 lb

## 2015-12-07 DIAGNOSIS — Z0000 Encounter for general adult medical examination without abnormal findings: Secondary | ICD-10-CM

## 2015-12-07 DIAGNOSIS — Z01419 Encounter for gynecological examination (general) (routine) without abnormal findings: Secondary | ICD-10-CM

## 2015-12-07 MED ORDER — NORETHIN ACE-ETH ESTRAD-FE 1-20 MG-MCG PO TABS: 1.0000 | ORAL_TABLET | Freq: Every day | ORAL | Status: DC

## 2015-12-07 NOTE — Patient Instructions (Signed)
1.  Pap smear is done. 2.  Refill Junelle FE 1/20. 3.  Continue with healthy eating and exercise. 4.  Return in one year for annual exam.

## 2015-12-07 NOTE — Progress Notes (Signed)
Patient ID: [name], female   DOB: [birth year], 29 y.o.   MRN: [id number] ANNUAL PREVENTATIVE CARE GYN  ENCOUNTER NOTE  Subjective:       [name] is a 29 y.o. G0P0000 female here for a routine annual gynecologic exam.  Current complaints: 1. No gynecologic complaints. Patient reports that her periods are lasting 3 days, but start approximately 4 days after her last OCP.  She is not having any intermenstrual bleeding. 2.  Left isolated posterior auricular lymph node-to be excised at the [facility] this week   Gynecologic History Patient's last menstrual period was 11/17/2015. Contraception: OCP (estrogen/progesterone) Last Pap: 11/26/2014 neg. Results were: normal Last mammogram: n/a. Results were: normal  Obstetric History OB History  Gravida Para Term Preterm AB SAB TAB Ectopic Multiple Living  $Remov'0 0 0 0 0 0 0 0 0 0 'cElyUi$        Past Medical History  Diagnosis Date  . Cerebral palsy ([place])   . Diabetes mellitus without complication ([place])   . H/O recurrent urinary tract infection   . Psoriasis   . H/O sickle cell trait   . E. coli UTI (urinary tract infection)   . BV (bacterial vaginosis)   . Spina bifida ([place])   . Myasthenia gravis ([place])   . GERD (gastroesophageal reflux disease)   . Yeast infection   . Hypokalemia   . Heavy periods   . Trichimoniasis   . Hypertension   . Vaginal discharge   . DUB (dysfunctional uterine bleeding)     Past Surgical History  Procedure Laterality Date  . Hysteroscopy w/d&c  2014    benign endometrial polyps  . Adenoidectomy    . Back surgery    . Foot surgery    . Tonsillectomy      Current Outpatient Prescriptions on File Prior to Visit  Medication Sig Dispense Refill  . Blood Glucose Monitoring Suppl (BAYER CONTOUR MONITOR) W/DEVICE KIT by Other route once. for 1 dose Use to check blood sugars. ICD-10 E11.9    . chlorthalidone (HYGROTON) 25 MG tablet TK 1 T PO QAM  4  . Cholecalciferol (VITAMIN D3) 50000 UNITS  CAPS TK 1 C PO ONCE A WEEK.  1  . glucose blood (BAYER CONTOUR TEST) test strip Check blood sugars three times per day. ICD-10 E11.9    . glyBURIDE (DIABETA) 5 MG tablet TK 1 T PO  BID WITH MEALS  11  . lisinopril (PRINIVIL,ZESTRIL) 5 MG tablet   11  . loratadine (CLARITIN) 10 MG tablet   0  . metFORMIN (GLUCOPHAGE) 500 MG tablet TK 1 T PO BID WITH MEALS  11  . mycophenolate (CELLCEPT) 500 MG tablet TK 3 TS PO BID.  4  . norethindrone-ethinyl estradiol (JUNEL FE 1/20) 1-20 MG-MCG tablet     . potassium chloride SA (K-DUR,KLOR-CON) 20 MEQ tablet TK 1 T PO QD  6  . predniSONE (DELTASONE) 10 MG tablet Take by mouth.    . pyridostigmine (MESTINON) 60 MG tablet TK 1 T PO Q 4 H  0  . ranitidine (ZANTAC) 150 MG tablet TK 1 T PO BID  0  . VESICARE 10 MG tablet TK 1 T PO QD  0   No current facility-administered medications on file prior to visit.    Allergies  Allergen Reactions  . Latex Other (See Comments)  . Penicillins Rash    Social History   Social History  . Marital Status: Married    Spouse Name: N/A  .  Number of Children: N/A  . Years of Education: N/A   Occupational History  . Not on file.   Social History Main Topics  . Smoking status: Never Smoker   . Smokeless tobacco: Not on file  . Alcohol Use: No  . Drug Use: No  . Sexual Activity: Yes    Birth Control/ Protection: Pill   Other Topics Concern  . Not on file   Social History Narrative    Family History  Problem Relation Age of Onset  . Family history unknown: Yes    The following portions of the patient's history were reviewed and updated as appropriate: allergies, current medications, past family history, past medical history, past social history, past surgical history and problem list.  Review of Systems ROS Review of Systems - General ROS: negative for - chills,  fever, hot flashes, night sweats, weight gain or weight loss.  POSITIVE-fatigue Psychological ROS: negative for - anxiety, decreased  libido, depression, mood swings, physical abuse or sexual abuse Ophthalmic ROS: negative for - blurry vision, eye pain or loss of vision ENT ROS: negative for - headaches, hearing change, visual changes or vocal changes Allergy and Immunology ROS: negative for - hives, itchy/watery eyes or seasonal allergies Hematological and Lymphatic ROS: negative for - bleeding problems, bruising, swollen lymph nodes or weight loss Endocrine ROS: negative for - galactorrhea, hair pattern changes, hot flashes, malaise/lethargy, mood swings, palpitations, polydipsia/polyuria, skin changes, temperature intolerance or unexpected weight changes Breast ROS: negative for - new or changing breast lumps or nipple discharge Respiratory ROS: negative for - cough or shortness of breath Cardiovascular ROS: negative for - chest pain, irregular heartbeat, palpitations or shortness of breath Gastrointestinal ROS: no abdominal pain, change in bowel habits, or black or bloody stools Genito-Urinary ROS: no dysuria, trouble voiding, or hematuria Musculoskeletal ROS: negative for - joint pain or joint stiffness Neurological ROS: negative for - bowel and bladder control changes Dermatological ROS: negative for rash and skin lesion changes   Objective:   BP 137/83 mmHg  Pulse 96  Ht 5' (1.524 m)  Wt 203 lb 4.8 oz (92.216 kg)  BMI 39.70 kg/m2  LMP 11/17/2015 CONSTITUTIONAL: Well-developed, well-nourished female in no acute distress.  PSYCHIATRIC: Normal mood and affect. Normal behavior. Normal judgment and thought content. [place]: Alert and oriented to person, place, and time. Normal muscle tone coordination. No cranial nerve deficit noted. HENT:  Normocephalic, atraumatic, External right and left ear normal. Oropharynx is clear and moist EYES: Conjunctivae and EOM are normal. Pupils are equal, round, and reactive to light. No scleral icterus.  NECK: Normal range of motion, supple, no masses.  Normal thyroid. Left side  exterior auricular lymph node visible and palpable 1.5 cm, nontender SKIN: Skin is warm and dry. No rash noted. Not diaphoretic. No erythema. No pallor. CARDIOVASCULAR: Normal heart rate noted, regular rhythm, no murmur. RESPIRATORY: Clear to auscultation bilaterally. Effort and breath sounds normal, no problems with respiration noted. BREASTS: Symmetric in size. No masses, skin changes, nipple drainage, or lymphadenopathy. ABDOMEN: Soft, normal bowel sounds, no distention noted.  No tenderness, rebound or guarding.  BLADDER: Normal PELVIC:  External Genitalia: Normal  BUS: Normal  Vagina: Normal  Cervix: Normal  Uterus: Normal; Midplane, normal size and shape, nontender, Mobile  Adnexa: Normal; Nonpalpable; nontender  RV: External Exam NormaI  MUSCULOSKELETAL: Normal range of motion. No tenderness.  No cyanosis, clubbing, or edema.  2+ distal pulses. LYMPHATIC: No Axillary, Supraclavicular, or Inguinal Adenopathy. 1.5 cm left post auricular lymph node  palpable, nontender    Assessment:   Annual gynecologic examination 29 y.o. Contraception: OCP (estrogen/progesterone) bmi- 39 Isolated lymphadenopathy, scheduled for removal this week.  (Left posterior auricular)  Plan:  Pap: Pap, Reflex if ASCUS Mammogram: Not Indicated Stool Guaiac Testing:  Not Indicated Labs: thur pcp Routine preventative health maintenance measures emphasized: Exercise/Diet/Weight control, Tobacco Warnings, Alcohol/Substance use risks and Safe Sex Return to [place], [place] Brayton Mars, MD  Note: This dictation was prepared with Dragon dictation along with smaller phrase technology. Any transcriptional errors that result from this process are unintentional.

## 2015-12-09 HISTORY — PX: OTHER SURGICAL HISTORY: SHX169

## 2015-12-11 LAB — PAP IG W/ RFLX HPV ASCU: PAP Smear Comment: 0

## 2015-12-25 ENCOUNTER — Emergency Department: Payer: Medicaid Other

## 2015-12-25 ENCOUNTER — Emergency Department
Admission: EM | Admit: 2015-12-25 | Discharge: 2015-12-25 | Disposition: A | Discharge status: Home / Self Care | Payer: Medicaid Other | Attending: Student | Admitting: Student

## 2015-12-25 ENCOUNTER — Encounter: Payer: Self-pay | Admitting: Emergency Medicine

## 2015-12-25 DIAGNOSIS — R000 Tachycardia, unspecified: Secondary | ICD-10-CM | POA: Insufficient documentation

## 2015-12-25 DIAGNOSIS — Z79899 Other long term (current) drug therapy: Secondary | ICD-10-CM | POA: Insufficient documentation

## 2015-12-25 DIAGNOSIS — Z880 Allergy status to penicillin: Secondary | ICD-10-CM | POA: Insufficient documentation

## 2015-12-25 DIAGNOSIS — Z91040 Latex allergy status: Secondary | ICD-10-CM | POA: Diagnosis not present

## 2015-12-25 DIAGNOSIS — R05 Cough: Secondary | ICD-10-CM | POA: Diagnosis not present

## 2015-12-25 DIAGNOSIS — E119 Type 2 diabetes mellitus without complications: Secondary | ICD-10-CM | POA: Insufficient documentation

## 2015-12-25 DIAGNOSIS — Z793 Long term (current) use of hormonal contraceptives: Secondary | ICD-10-CM | POA: Diagnosis not present

## 2015-12-25 DIAGNOSIS — I10 Essential (primary) hypertension: Secondary | ICD-10-CM | POA: Diagnosis not present

## 2015-12-25 DIAGNOSIS — Z7984 Long term (current) use of oral hypoglycemic drugs: Secondary | ICD-10-CM | POA: Diagnosis not present

## 2015-12-25 DIAGNOSIS — N390 Urinary tract infection, site not specified: Secondary | ICD-10-CM

## 2015-12-25 DIAGNOSIS — R059 Cough, unspecified: Secondary | ICD-10-CM

## 2015-12-25 LAB — URINALYSIS COMPLETE WITH MICROSCOPIC (ARMC ONLY)
Bilirubin Urine: NEGATIVE
Glucose, UA: NEGATIVE mg/dL
Hgb urine dipstick: NEGATIVE
Nitrite: POSITIVE — AB
PH: 6 (ref 5.0–8.0)
PROTEIN: NEGATIVE mg/dL
Specific Gravity, Urine: 1.017 (ref 1.005–1.030)

## 2015-12-25 LAB — COMPREHENSIVE METABOLIC PANEL
ALBUMIN: 4.1 g/dL (ref 3.5–5.0)
ALT: 17 U/L (ref 14–54)
AST: 32 U/L (ref 15–41)
Alkaline Phosphatase: 45 U/L (ref 38–126)
Anion gap: 13 (ref 5–15)
BILIRUBIN TOTAL: 0.8 mg/dL (ref 0.3–1.2)
BUN: 9 mg/dL (ref 6–20)
CHLORIDE: 91 mmol/L — AB (ref 101–111)
CO2: 31 mmol/L (ref 22–32)
CREATININE: 0.74 mg/dL (ref 0.44–1.00)
Calcium: 9 mg/dL (ref 8.9–10.3)
GFR calc Af Amer: >60 mL/min (ref >60)
GFR calc non Af Amer: >60 mL/min (ref >60)
Glucose, Bld: 171 mg/dL — ABNORMAL HIGH (ref 65–99)
POTASSIUM: 3.3 mmol/L — AB (ref 3.5–5.1)
SODIUM: 135 mmol/L (ref 135–145)
Total Protein: 8 g/dL (ref 6.5–8.1)

## 2015-12-25 LAB — CBC WITH DIFFERENTIAL/PLATELET
BASOS ABS: 0.1 10*3/uL (ref 0–0.1)
BASOS PCT: 1 %
Eosinophils Absolute: 0 10*3/uL (ref 0–0.7)
Eosinophils Relative: 0 %
HCT: 45.4 % (ref 35.0–47.0)
Hemoglobin: 14.8 g/dL (ref 12.0–16.0)
Lymphocytes Relative: 10 %
Lymphs Abs: 1.3 10*3/uL (ref 1.0–3.6)
MCH: 25.3 pg — ABNORMAL LOW (ref 26.0–34.0)
MCHC: 32.5 g/dL (ref 32.0–36.0)
MCV: 77.9 fL — ABNORMAL LOW (ref 80.0–100.0)
MONO ABS: 0.8 10*3/uL (ref 0.2–0.9)
Monocytes Relative: 7 %
NEUTROS PCT: 82 %
Neutro Abs: 10.3 10*3/uL — ABNORMAL HIGH (ref 1.4–6.5)
Platelets: 332 10*3/uL (ref 150–440)
RBC: 5.83 MIL/uL — ABNORMAL HIGH (ref 3.80–5.20)
RDW: 13 % (ref 11.5–14.5)
WBC: 12.6 10*3/uL — ABNORMAL HIGH (ref 3.6–11.0)

## 2015-12-25 LAB — GLUCOSE, CAPILLARY: Glucose-Capillary: 179 mg/dL — ABNORMAL HIGH (ref 65–99)

## 2015-12-25 MED ORDER — NITROFURANTOIN MONOHYD MACRO 100 MG PO CAPS
100.0000 mg | ORAL_CAPSULE | Freq: Two times a day (BID) | ORAL | Status: AC
Start: 2015-12-25 — End: 2016-01-01

## 2015-12-25 MED ORDER — NITROFURANTOIN MONOHYD MACRO 100 MG PO CAPS
100.0000 mg | ORAL_CAPSULE | Freq: Once | ORAL | Status: AC
  Administered 2015-12-25: 100 mg via ORAL
  Filled 2015-12-25: qty 1

## 2015-12-25 MED ORDER — SODIUM CHLORIDE 0.9 % IV BOLUS (SEPSIS)
1000.0000 mL | Freq: Once | INTRAVENOUS | Status: AC
  Administered 2015-12-25: 1000 mL via INTRAVENOUS

## 2015-12-25 NOTE — ED Notes (Signed)
Patient states needs blood sugar checked, states is diabetic and has not checked sugar in months, states would like urine checked because she self caths due to spina bifada and has not been checked for UTI recently.

## 2015-12-25 NOTE — ED Notes (Signed)
Productive cough x 2 days.

## 2015-12-25 NOTE — ED Provider Notes (Signed)
[facility] Emergency Department Provider Note  ____________________________________________  Time seen: Approximately 1:35 PM  I have reviewed the triage vital signs and the nursing notes.   HISTORY  Chief Complaint Cough    HPI [name] is a 29 y.o. female with history of cerebral palsy requiring intermittent self catheterization for urine, myasthenia gravis on chronic prednisone, psoriasis, DM who presents for evaluation of 2 days of productive cough and generalized weakness, onset, constant since onset, currently I'll to moderate, no modifying factors. She has had subjective fevers per she is concerned that she may have a urinary tract infection as she often feels generally weak when this is the case. She is not had 2 in and out catheter herself in some time. She denies any chest pain, no breathing difficulty, no vomiting, or diarrhea.   Past Medical History  Diagnosis Date  . Cerebral palsy ([place])   . Diabetes mellitus without complication ([place])   . H/O recurrent urinary tract infection   . Psoriasis   . H/O sickle cell trait   . E. coli UTI (urinary tract infection)   . BV (bacterial vaginosis)   . Spina bifida ([place])   . Myasthenia gravis ([place])   . GERD (gastroesophageal reflux disease)   . Yeast infection   . Hypokalemia   . Heavy periods   . Trichimoniasis   . Hypertension   . Vaginal discharge   . DUB (dysfunctional uterine bleeding)     Patient Active Problem List   Diagnosis Date Noted  . Absolute anemia 08/17/2015  . Hay fever 05/17/2015  . Can't get food down 05/17/2015  . Secondary diabetes mellitus ([place]) 03/15/2015  . Other long term (current) drug therapy 04/20/2014  . Long term current use of systemic steroids 04/20/2014  . Clinical depression 01/19/2014  . Adenopathy 01/19/2014  . CN (constipation) 08/12/2013  . Spina bifida ([place]) 08/12/2013  . Esophagitis, reflux 10/02/2011  . Avitaminosis D 10/02/2011  . Essential  (primary) hypertension 05/15/2011  . Myasthenia gravis ([place]) 12/06/2010    Past Surgical History  Procedure Laterality Date  . Hysteroscopy w/d&c  2014    benign endometrial polyps  . Adenoidectomy    . Back surgery    . Foot surgery    . Tonsillectomy      Current Outpatient Rx  Name  Route  Sig  Dispense  Refill  . Blood Glucose Monitoring Suppl (BAYER CONTOUR MONITOR) W/DEVICE KIT      by Other route once. for 1 dose Use to check blood sugars. ICD-10 E11.9         . chlorthalidone (HYGROTON) 25 MG tablet      TK 1 T PO QAM      4   . Cholecalciferol (VITAMIN D3) 50000 UNITS CAPS      TK 1 C PO ONCE A WEEK.      1   . Fluocinolone Acetonide (DERMA-SMOOTHE/FS BODY) 0.01 % OIL            5   . glucose blood (BAYER CONTOUR TEST) test strip      Check blood sugars three times per day. ICD-10 E11.9         . glyBURIDE (DIABETA) 5 MG tablet      TK 1 T PO  BID WITH MEALS      11   . ketoconazole (NIZORAL) 2 % cream      Apply daily to flaky areas on face and under right armpit         .  ketoconazole (NIZORAL) 2 % shampoo      Shampoo face and ears twice a week.  Let lather sit for 2-3 minutes before rinsing. Use shampoo for scalp each time you wash your hair.         Marland Kitchen lisinopril (PRINIVIL,ZESTRIL) 5 MG tablet            11   . loratadine (CLARITIN) 10 MG tablet            0   . metFORMIN (GLUCOPHAGE) 500 MG tablet      TK 1 T PO BID WITH MEALS      11   . mycophenolate (CELLCEPT) 500 MG tablet      TK 3 TS PO BID.      4   . norethindrone-ethinyl estradiol (JUNEL FE 1/20) 1-20 MG-MCG tablet   Oral   Take 1 tablet by mouth daily.   3 Package   4   . potassium chloride SA (K-DUR,KLOR-CON) 20 MEQ tablet      TK 1 T PO QD      6   . predniSONE (DELTASONE) 10 MG tablet   Oral   Take by mouth.         . pyridostigmine (MESTINON) 60 MG tablet      TK 1 T PO Q 4 H      0   . ranitidine (ZANTAC) 150 MG tablet       TK 1 T PO BID      0   . VESICARE 10 MG tablet      TK 1 T PO QD      0     Dispense as written.     Allergies Latex and Penicillins  Family History  Problem Relation Age of Onset  . Family history unknown: Yes    Social History Social History  Substance Use Topics  . Smoking status: Never Smoker   . Smokeless tobacco: None  . Alcohol Use: No    Review of Systems Constitutional: + subjective fever/chills Eyes: No visual changes. ENT: No sore throat. Cardiovascular: Denies chest pain. Respiratory: Denies shortness of breath. Gastrointestinal: No abdominal pain.  No nausea, no vomiting.  No diarrhea.  No constipation. Genitourinary: Negative for dysuria. Musculoskeletal: Negative for back pain. Skin: Negative for rash. Neurological: Negative for headaches, focal weakness or numbness.  10-point ROS otherwise negative.  ____________________________________________   PHYSICAL EXAM:  VITAL SIGNS: ED Triage Vitals  Enc Vitals Group     BP 12/25/15 1048 138/102 mmHg     Pulse Rate 12/25/15 1048 106     Resp 12/25/15 1048 20     Temp 12/25/15 1048 99.4 F (37.4 C)     Temp Source 12/25/15 1048 Oral     SpO2 12/25/15 1048 98 %     Weight 12/25/15 1048 200 lb (90.719 kg)     Height 12/25/15 1048 5' (1.524 m)     Head Cir --      Peak Flow --      Pain Score 12/25/15 1051 8     Pain Loc --      Pain Edu? --      Excl. in [place]? --     Constitutional: Alert and oriented. Well appearing and in no acute distress. Siting up in bed, texting on her cellular phone, sipping ginger ale. Eyes: Conjunctivae are normal. PERRL. EOMI. Head: Atraumatic. Nose: No congestion/rhinnorhea. Mouth/Throat: Mucous membranes are moist.  Oropharynx non-erythematous. Neck: No stridor. Supple  without meningismus. Cardiovascular: Tachycarduc rate, regular rhythm. Grossly normal heart sounds.  Good peripheral circulation. Respiratory: Normal respiratory effort.  No retractions. Lungs  CTAB. Gastrointestinal: Soft and nontender. No distention.  No CVA tenderness. Genitourinary: deferred Musculoskeletal: No lower extremity tenderness nor edema.  No joint effusions. Neurologic:  Normal speech and language. No gross focal neurologic deficits are appreciated. No gait instability. Skin:  Skin is warm, dry and intact. No rash noted. Psychiatric: Mood and affect are normal. Speech and behavior are normal.  ____________________________________________   LABS (all labs ordered are listed, but only abnormal results are displayed)  Labs Reviewed  CBC WITH DIFFERENTIAL/PLATELET - Abnormal; Notable for the following:    WBC 12.6 (*)    RBC 5.83 (*)    MCV 77.9 (*)    MCH 25.3 (*)    Neutro Abs 10.3 (*)    All other components within normal limits  COMPREHENSIVE METABOLIC PANEL - Abnormal; Notable for the following:    Potassium 3.3 (*)    Chloride 91 (*)    Glucose, Bld 171 (*)    All other components within normal limits  URINALYSIS COMPLETEWITH MICROSCOPIC ([facility] ONLY) - Abnormal; Notable for the following:    Color, Urine YELLOW (*)    APPearance CLOUDY (*)    Ketones, ur TRACE (*)    Nitrite POSITIVE (*)    Leukocytes, UA 1+ (*)    Bacteria, UA MANY (*)    Squamous Epithelial / LPF 6-30 (*)    All other components within normal limits  GLUCOSE, CAPILLARY - Abnormal; Notable for the following:    Glucose-Capillary 179 (*)    All other components within normal limits   ____________________________________________  EKG  none ____________________________________________  RADIOLOGY  CXR IMPRESSION: Focal prominent atelectasis in the right upper lobe, possibly due to mucous plug. Bronchitic changes. ____________________________________________   PROCEDURES  Procedure(s) performed: None  Critical Care performed: No  ____________________________________________   INITIAL IMPRESSION / ASSESSMENT AND PLAN / ED COURSE  Pertinent labs & imaging results  that were available during my care of the patient were reviewed by me and considered in my medical decision making (see chart for details).  [name] is a 29 y.o. female with history of cerebral palsy requiring intermittent self catheterization for urine, myasthenia gravis on chronic prednisone, psoriasis, DM who presents for evaluation of 2 days of productive cough and generalized weakness. On exam, she is very well-appearing and in no acute distress. Mild tachycardic but otherwise, vital signs are stable, she is afebrile. She is benign physical examination. Labs are notable for leukocytosis with white blood cell count 12.6 however her leukocytosis appears chronic and may be secondary to the fact that she is on chronic steroid therapy. CMP with mild hypokalemia at 3.3. Urinalysis shows nitrites,1+ leukocytes as well as many bacteria and clumps of white blood cells too numerous to count concerning for urinary tract infection. Chest x-ray with a tiny area of atelectasis in the right upper lobe which is thought to be secondary to mucous plug, she is not tachypneic, not hypoxic and I do not think this represents pneumonia at this time. Additionally, she has multiple aversions to antibiotics because of potential complications with her myasthenia therefore will give IV hydration as well as Macrobid and anticipate discharge with close PCP follow-up once her heart rate improves.  ----------------------------------------- 4:07 PM on 12/25/2015 -----------------------------------------  Heart rate improved to 105 bpm at this time after IV fluids. The patient reports she feels well and is  requesting discharge. She believes that her heart rate "always runs a little bit high because of the meds I take". When she was seen here a few months ago in this emergency department, she also had mild tachycardia and this may be her baseline. We discussed return precautions, need for close PCP follow-up and she is  comfortable with the discharge plan. ____________________________________________   FINAL CLINICAL IMPRESSION(S) / ED DIAGNOSES  Final diagnoses:  UTI (lower urinary tract infection)  Cough      Joanne Gavel, MD 12/25/15 [phone]

## 2016-01-12 ENCOUNTER — Emergency Department: Payer: Medicaid Other

## 2016-01-12 ENCOUNTER — Encounter: Payer: Self-pay | Admitting: *Deleted

## 2016-01-12 ENCOUNTER — Inpatient Hospital Stay
Admission: EM | Admit: 2016-01-12 | Discharge: 2016-01-15 | DRG: 871 | Disposition: A | Discharge status: Home / Self Care | Payer: Medicaid Other | Attending: Internal Medicine | Admitting: Internal Medicine

## 2016-01-12 DIAGNOSIS — Q059 Spina bifida, unspecified: Secondary | ICD-10-CM

## 2016-01-12 DIAGNOSIS — A4151 Sepsis due to Escherichia coli [E. coli]: Secondary | ICD-10-CM | POA: Diagnosis present

## 2016-01-12 DIAGNOSIS — D573 Sickle-cell trait: Secondary | ICD-10-CM | POA: Diagnosis present

## 2016-01-12 DIAGNOSIS — K219 Gastro-esophageal reflux disease without esophagitis: Secondary | ICD-10-CM | POA: Diagnosis present

## 2016-01-12 DIAGNOSIS — Z87440 Personal history of urinary (tract) infections: Secondary | ICD-10-CM | POA: Diagnosis not present

## 2016-01-12 DIAGNOSIS — J189 Pneumonia, unspecified organism: Secondary | ICD-10-CM | POA: Diagnosis present

## 2016-01-12 DIAGNOSIS — A4189 Other specified sepsis: Secondary | ICD-10-CM | POA: Diagnosis present

## 2016-01-12 DIAGNOSIS — Z91040 Latex allergy status: Secondary | ICD-10-CM | POA: Diagnosis not present

## 2016-01-12 DIAGNOSIS — G809 Cerebral palsy, unspecified: Secondary | ICD-10-CM | POA: Diagnosis present

## 2016-01-12 DIAGNOSIS — G7000 Myasthenia gravis without (acute) exacerbation: Secondary | ICD-10-CM | POA: Diagnosis present

## 2016-01-12 DIAGNOSIS — Z91041 Radiographic dye allergy status: Secondary | ICD-10-CM | POA: Diagnosis not present

## 2016-01-12 DIAGNOSIS — N319 Neuromuscular dysfunction of bladder, unspecified: Secondary | ICD-10-CM | POA: Diagnosis present

## 2016-01-12 DIAGNOSIS — L409 Psoriasis, unspecified: Secondary | ICD-10-CM | POA: Diagnosis present

## 2016-01-12 DIAGNOSIS — Z79899 Other long term (current) drug therapy: Secondary | ICD-10-CM | POA: Diagnosis not present

## 2016-01-12 DIAGNOSIS — E119 Type 2 diabetes mellitus without complications: Secondary | ICD-10-CM | POA: Diagnosis present

## 2016-01-12 DIAGNOSIS — Z880 Allergy status to penicillin: Secondary | ICD-10-CM | POA: Diagnosis not present

## 2016-01-12 DIAGNOSIS — I10 Essential (primary) hypertension: Secondary | ICD-10-CM | POA: Diagnosis present

## 2016-01-12 DIAGNOSIS — E872 Acidosis, unspecified: Secondary | ICD-10-CM

## 2016-01-12 DIAGNOSIS — Z7952 Long term (current) use of systemic steroids: Secondary | ICD-10-CM

## 2016-01-12 DIAGNOSIS — Z7984 Long term (current) use of oral hypoglycemic drugs: Secondary | ICD-10-CM | POA: Diagnosis not present

## 2016-01-12 DIAGNOSIS — N390 Urinary tract infection, site not specified: Secondary | ICD-10-CM | POA: Diagnosis present

## 2016-01-12 DIAGNOSIS — R0602 Shortness of breath: Secondary | ICD-10-CM

## 2016-01-12 DIAGNOSIS — A419 Sepsis, unspecified organism: Secondary | ICD-10-CM | POA: Diagnosis present

## 2016-01-12 HISTORY — DX: Pneumonia, unspecified organism: J18.9

## 2016-01-12 LAB — LACTIC ACID, PLASMA
Lactic Acid, Venous: 4.7 mmol/L (ref 0.5–2.0)
Lactic Acid, Venous: 4.7 mmol/L (ref 0.5–2.0)

## 2016-01-12 LAB — CBC WITH DIFFERENTIAL/PLATELET
Basophils Absolute: 0 10*3/uL (ref 0–0.1)
Basophils Relative: 0 %
EOS ABS: 0.2 10*3/uL (ref 0–0.7)
Eosinophils Relative: 2 %
HEMATOCRIT: 41.9 % (ref 35.0–47.0)
HEMOGLOBIN: 13.5 g/dL (ref 12.0–16.0)
LYMPHS ABS: 1.8 10*3/uL (ref 1.0–3.6)
LYMPHS PCT: 15 %
MCH: 25.2 pg — AB (ref 26.0–34.0)
MCHC: 32.2 g/dL (ref 32.0–36.0)
MCV: 78.4 fL — AB (ref 80.0–100.0)
MONOS PCT: 7 %
Monocytes Absolute: 0.8 10*3/uL (ref 0.2–0.9)
NEUTROS PCT: 76 %
Neutro Abs: 9.4 10*3/uL — ABNORMAL HIGH (ref 1.4–6.5)
Platelets: 361 10*3/uL (ref 150–440)
RBC: 5.34 MIL/uL — ABNORMAL HIGH (ref 3.80–5.20)
RDW: 13.6 % (ref 11.5–14.5)
WBC: 12.3 10*3/uL — ABNORMAL HIGH (ref 3.6–11.0)

## 2016-01-12 LAB — URINALYSIS COMPLETE WITH MICROSCOPIC (ARMC ONLY)
Bilirubin Urine: NEGATIVE
Glucose, UA: NEGATIVE mg/dL
Hgb urine dipstick: NEGATIVE
Nitrite: POSITIVE — AB
Protein, ur: NEGATIVE mg/dL
Specific Gravity, Urine: 1.02 (ref 1.005–1.030)
pH: 6 (ref 5.0–8.0)

## 2016-01-12 LAB — COMPREHENSIVE METABOLIC PANEL
ALT: 9 U/L — ABNORMAL LOW (ref 14–54)
AST: 19 U/L (ref 15–41)
Albumin: 4 g/dL (ref 3.5–5.0)
Alkaline Phosphatase: 36 U/L — ABNORMAL LOW (ref 38–126)
Anion gap: 13 (ref 5–15)
BUN: 10 mg/dL (ref 6–20)
CO2: 27 mmol/L (ref 22–32)
Calcium: 9.2 mg/dL (ref 8.9–10.3)
Chloride: 96 mmol/L — ABNORMAL LOW (ref 101–111)
Creatinine, Ser: 0.68 mg/dL (ref 0.44–1.00)
GFR calc Af Amer: >60 mL/min (ref >60)
GFR calc non Af Amer: >60 mL/min (ref >60)
Glucose, Bld: 172 mg/dL — ABNORMAL HIGH (ref 65–99)
Potassium: 3.5 mmol/L (ref 3.5–5.1)
Sodium: 136 mmol/L (ref 135–145)
Total Bilirubin: 0.3 mg/dL (ref 0.3–1.2)
Total Protein: 7.9 g/dL (ref 6.5–8.1)

## 2016-01-12 LAB — GLUCOSE, CAPILLARY: GLUCOSE-CAPILLARY: 371 mg/dL — AB (ref 65–99)

## 2016-01-12 LAB — POCT PREGNANCY, URINE: Preg Test, Ur: NEGATIVE

## 2016-01-12 MED ORDER — METHYLPREDNISOLONE SODIUM SUCC 125 MG IJ SOLR
125.0000 mg | Freq: Once | INTRAMUSCULAR | Status: AC
Start: 2016-01-12 — End: 2016-01-12
  Administered 2016-01-12: 125 mg via INTRAVENOUS
  Filled 2016-01-12: qty 2

## 2016-01-12 MED ORDER — IOHEXOL 350 MG/ML SOLN
100.0000 mL | Freq: Once | INTRAVENOUS | Status: AC | PRN
  Administered 2016-01-12: 100 mL via INTRAVENOUS

## 2016-01-12 MED ORDER — HEPARIN SODIUM (PORCINE) 5000 UNIT/ML IJ SOLN
5000.0000 [IU] | Freq: Three times a day (TID) | INTRAMUSCULAR | Status: DC
  Administered 2016-01-12 – 2016-01-13 (×2): 5000 [IU] via SUBCUTANEOUS
  Filled 2016-01-12 (×3): qty 1

## 2016-01-12 MED ORDER — NORETHIN ACE-ETH ESTRAD-FE 1-20 MG-MCG PO TABS
1.0000 | ORAL_TABLET | Freq: Every day | ORAL | Status: DC
  Filled 2016-01-12: qty 1

## 2016-01-12 MED ORDER — FAMOTIDINE 20 MG PO TABS
20.0000 mg | ORAL_TABLET | Freq: Two times a day (BID) | ORAL | Status: DC
  Administered 2016-01-12 – 2016-01-15 (×6): 20 mg via ORAL
  Filled 2016-01-12 (×6): qty 1

## 2016-01-12 MED ORDER — ONDANSETRON HCL 4 MG PO TABS: 4.0000 mg | ORAL_TABLET | Freq: Four times a day (QID) | ORAL | Status: DC | PRN

## 2016-01-12 MED ORDER — ALBUTEROL SULFATE (2.5 MG/3ML) 0.083% IN NEBU: 2.5000 mg | INHALATION_SOLUTION | Freq: Once | RESPIRATORY_TRACT | Status: DC

## 2016-01-12 MED ORDER — LEVOFLOXACIN IN D5W 750 MG/150ML IV SOLN
750.0000 mg | Freq: Once | INTRAVENOUS | Status: AC
  Administered 2016-01-12: 750 mg via INTRAVENOUS
  Filled 2016-01-12: qty 150

## 2016-01-12 MED ORDER — LEVOFLOXACIN IN D5W 750 MG/150ML IV SOLN
750.0000 mg | INTRAVENOUS | Status: DC
  Administered 2016-01-13: 750 mg via INTRAVENOUS
  Filled 2016-01-12 (×2): qty 150

## 2016-01-12 MED ORDER — INSULIN ASPART 100 UNIT/ML ~~LOC~~ SOLN
0.0000 [IU] | Freq: Three times a day (TID) | SUBCUTANEOUS | Status: DC
  Administered 2016-01-13 (×2): 5 [IU] via SUBCUTANEOUS
  Filled 2016-01-12 (×2): qty 5

## 2016-01-12 MED ORDER — FAMOTIDINE 20 MG PO TABS
20.0000 mg | ORAL_TABLET | Freq: Every day | ORAL | Status: DC
  Filled 2016-01-12: qty 1

## 2016-01-12 MED ORDER — PREDNISONE 10 MG PO TABS
5.0000 mg | ORAL_TABLET | Freq: Every day | ORAL | Status: DC
  Administered 2016-01-13 – 2016-01-15 (×3): 5 mg via ORAL
  Filled 2016-01-12 (×2): qty 1
  Filled 2016-01-12: qty 2

## 2016-01-12 MED ORDER — PENTAFLUOROPROP-TETRAFLUOROETH EX AERO
INHALATION_SPRAY | CUTANEOUS | Status: DC | PRN
  Administered 2016-01-12: 14:00:00 via TOPICAL

## 2016-01-12 MED ORDER — SODIUM CHLORIDE 0.9 % IV BOLUS (SEPSIS)
2000.0000 mL | Freq: Once | INTRAVENOUS | Status: AC
  Administered 2016-01-12: 2000 mL via INTRAVENOUS

## 2016-01-12 MED ORDER — ALBUTEROL SULFATE HFA 108 (90 BASE) MCG/ACT IN AERS: 2.0000 | INHALATION_SPRAY | Freq: Once | RESPIRATORY_TRACT | Status: DC

## 2016-01-12 MED ORDER — SODIUM CHLORIDE 0.9 % IV BOLUS (SEPSIS): 1000.0000 mL | INTRAVENOUS | Status: DC

## 2016-01-12 MED ORDER — PYRIDOSTIGMINE BROMIDE 60 MG PO TABS
60.0000 mg | ORAL_TABLET | ORAL | Status: DC
Start: 2016-01-12 — End: 2016-01-15
  Administered 2016-01-12 – 2016-01-15 (×11): 60 mg via ORAL
  Filled 2016-01-12 (×11): qty 1

## 2016-01-12 MED ORDER — ACETAMINOPHEN 325 MG PO TABS: 650.0000 mg | ORAL_TABLET | Freq: Four times a day (QID) | ORAL | Status: DC | PRN

## 2016-01-12 MED ORDER — DIPHENHYDRAMINE HCL 50 MG/ML IJ SOLN
50.0000 mg | Freq: Once | INTRAMUSCULAR | Status: AC
  Administered 2016-01-12: 50 mg via INTRAVENOUS
  Filled 2016-01-12: qty 1

## 2016-01-12 MED ORDER — PENTAFLUOROPROP-TETRAFLUOROETH EX AERO: INHALATION_SPRAY | CUTANEOUS | Status: AC

## 2016-01-12 MED ORDER — ACETAMINOPHEN 650 MG RE SUPP: 650.0000 mg | Freq: Four times a day (QID) | RECTAL | Status: DC | PRN

## 2016-01-12 MED ORDER — POTASSIUM CHLORIDE CRYS ER 20 MEQ PO TBCR
20.0000 meq | EXTENDED_RELEASE_TABLET | Freq: Every day | ORAL | Status: DC
  Administered 2016-01-13 – 2016-01-15 (×3): 20 meq via ORAL
  Filled 2016-01-12 (×4): qty 1

## 2016-01-12 MED ORDER — DARIFENACIN HYDROBROMIDE ER 7.5 MG PO TB24
15.0000 mg | ORAL_TABLET | Freq: Every day | ORAL | Status: DC
  Administered 2016-01-13 – 2016-01-15 (×3): 15 mg via ORAL
  Filled 2016-01-12 (×4): qty 2

## 2016-01-12 MED ORDER — PENTAFLUOROPROP-TETRAFLUOROETH EX AERO
INHALATION_SPRAY | CUTANEOUS | Status: AC
  Filled 2016-01-12: qty 30

## 2016-01-12 MED ORDER — SODIUM CHLORIDE 0.9 % IJ SOLN
3.0000 mL | Freq: Two times a day (BID) | INTRAMUSCULAR | Status: DC
  Administered 2016-01-12 – 2016-01-15 (×6): 3 mL via INTRAVENOUS

## 2016-01-12 MED ORDER — IPRATROPIUM-ALBUTEROL 0.5-2.5 (3) MG/3ML IN SOLN
3.0000 mL | Freq: Once | RESPIRATORY_TRACT | Status: AC
  Administered 2016-01-12: 3 mL via RESPIRATORY_TRACT
  Filled 2016-01-12: qty 3

## 2016-01-12 MED ORDER — INSULIN ASPART 100 UNIT/ML ~~LOC~~ SOLN
0.0000 [IU] | Freq: Every day | SUBCUTANEOUS | Status: DC
  Administered 2016-01-12: 5 [IU] via SUBCUTANEOUS
  Administered 2016-01-13: 4 [IU] via SUBCUTANEOUS
  Filled 2016-01-12: qty 3
  Filled 2016-01-12: qty 4
  Filled 2016-01-12 (×2): qty 5

## 2016-01-12 MED ORDER — MYCOPHENOLATE MOFETIL 250 MG PO CAPS
1500.0000 mg | ORAL_CAPSULE | Freq: Two times a day (BID) | ORAL | Status: DC
  Administered 2016-01-13 – 2016-01-15 (×5): 1500 mg via ORAL
  Filled 2016-01-12 (×6): qty 6

## 2016-01-12 MED ORDER — ONDANSETRON HCL 4 MG/2ML IJ SOLN: 4.0000 mg | Freq: Four times a day (QID) | INTRAMUSCULAR | Status: DC | PRN

## 2016-01-12 MED ORDER — HYDROCODONE-HOMATROPINE 5-1.5 MG/5ML PO SYRP
5.0000 mL | ORAL_SOLUTION | Freq: Once | ORAL | Status: AC
Start: 2016-01-12 — End: 2016-01-12
  Administered 2016-01-12: 5 mL via ORAL
  Filled 2016-01-12: qty 5

## 2016-01-12 NOTE — ED Notes (Signed)
Attempt for 20G to L AC unsuccessful. Necessary for CT Angio

## 2016-01-12 NOTE — ED Notes (Signed)
Pt states that contrast dye causes her to itch. Pt given IV medications before CT

## 2016-01-12 NOTE — ED Provider Notes (Signed)
CSN: [id number]     Arrival date & time 01/12/16  7829 History   First MD Initiated Contact with Patient 01/12/16 [phone]     Chief Complaint  Patient presents with  . Cough     (Consider location/radiation/quality/duration/timing/severity/associated sxs/prior Treatment) The history is provided by the patient.  [name] is a 30 y.o. female hx of cerebral palsy, self cath with hx of UTI, myasthenia gravis on chronic prednisone here with cough, shortness of breath. Patient states that she has a productive cough for the last 3 weeks. She was seen here 3 weeks ago at the negative chest x-ray was diagnosed with urinary tract infection was on Macrobid. Patient states that her cough has not come better. She states that it's more productive for yellow and greenish sputum and sometimes blood-tinged. Has some subjective fevers as well and intermittent shortness of breath. Denies any abdominal pain and she still self caths and the urine appears normal to her.    Past Medical History  Diagnosis Date  . Cerebral palsy ([facility])   . Diabetes mellitus without complication ([facility])   . H/O recurrent urinary tract infection   . Psoriasis   . H/O sickle cell trait   . E. coli UTI (urinary tract infection)   . BV (bacterial vaginosis)   . Spina bifida ([facility])   . Myasthenia gravis ([facility])   . GERD (gastroesophageal reflux disease)   . Yeast infection   . Hypokalemia   . Heavy periods   . Trichimoniasis   . Hypertension   . Vaginal discharge   . DUB (dysfunctional uterine bleeding)    Past Surgical History  Procedure Laterality Date  . Hysteroscopy w/d&c  2014    benign endometrial polyps  . Adenoidectomy    . Back surgery    . Foot surgery    . Tonsillectomy     Family History  Problem Relation Age of Onset  . Family history unknown: Yes   Social History  Substance Use Topics  . Smoking status: Never Smoker   . Smokeless tobacco: None  . Alcohol Use: No   OB History    Gravida Para Term  Preterm AB TAB SAB Ectopic Multiple Living       Review of Systems  Respiratory: Positive for cough and shortness of breath.   All other systems reviewed and are negative.     Allergies  Contrast media; Latex; and Penicillins  Home Medications   Prior to Admission medications   Medication Sig Start Date End Date Taking? Authorizing Provider  chlorthalidone (HYGROTON) 25 MG tablet Take 25 mg by mouth daily.   Yes Historical Provider, MD  Cholecalciferol (VITAMIN D3) 50000 units CAPS Take 1 capsule by mouth once a week. 11/25/15  Yes Historical Provider, MD  FLUOCINOLONE ACETONIDE BODY 0.01 % OIL Apply 1 application topically daily as needed. 12/29/15  Yes Historical Provider, MD  glyBURIDE (DIABETA) 5 MG tablet Take 5 mg by mouth 2 (two) times daily with a meal.   Yes Historical Provider, MD  ketoconazole (NIZORAL) 2 % cream Apply daily to flaky areas on face and under right armpit 11/17/15  Yes Historical Provider, MD  lisinopril (PRINIVIL,ZESTRIL) 5 MG tablet Take 5 mg by mouth daily.   Yes Historical Provider, MD  loratadine (CLARITIN) 10 MG tablet Take 10 mg by mouth daily.   Yes Historical [id number]  metFORMIN (GLUCOPHAGE) 500 MG tablet Take 500 mg by mouth 2 (two)  times daily with a meal.   Yes Historical Provider, MD  mycophenolate (CELLCEPT) 500 MG tablet Take 1,500 mg by mouth 2 (two) times daily.   Yes Historical Provider, MD  norethindrone-ethinyl estradiol (JUNEL FE 1/20) 1-20 MG-MCG tablet Take 1 tablet by mouth daily. 12/07/15  Yes Prentice Docker Defrancesco, MD  potassium chloride SA (K-DUR,KLOR-CON) 20 MEQ tablet Take 20 mEq by mouth daily.   Yes Historical Provider, MD  predniSONE (DELTASONE) 10 MG tablet Take 5 mg by mouth daily with breakfast.    Yes Historical Provider, MD  pyridostigmine (MESTINON) 60 MG tablet Take 60 mg by mouth every 4 (four) hours.   Yes Historical Provider, MD  ranitidine (ZANTAC) 150 MG tablet Take 150 mg by mouth 2 (two) times  daily.   Yes Historical Provider, MD  solifenacin (VESICARE) 10 MG tablet Take 10 mg by mouth daily.   Yes Historical Provider, MD   BP 137/76 mmHg  Pulse 110  Temp(Src) 98.2 F (36.8 C) (Oral)  Resp 21  Ht 5' (1.524 m)  Wt 200 lb (90.719 kg)  BMI 39.06 kg/m2  SpO2 100%  LMP 12/25/2015 Physical Exam  Constitutional: She is oriented to person, place, and time.  Chronically ill, coughing   HENT:  Head: Normocephalic.  Mouth/Throat: Oropharynx is clear and moist.  Eyes: Conjunctivae are normal. Pupils are equal, round, and reactive to light.  Neck: Normal range of motion. Neck supple.  Cardiovascular: Regular rhythm, normal heart sounds and intact distal pulses.   Slightly tachy   Pulmonary/Chest:  Slightly tachypneic, mild wheezing throughout   Abdominal: Soft. Bowel sounds are normal. She exhibits no distension. There is no tenderness. There is no rebound.  Musculoskeletal:  Leg braces in place (chronic), no calf swelling   Neurological: She is alert and oriented to person, place, and time.  Skin: Skin is warm and dry.  Psychiatric: She has a normal mood and affect. Her behavior is normal. Judgment and thought content normal.  Nursing note and vitals reviewed.   ED Course  Procedures (including critical care time) Labs Review Labs Reviewed  COMPREHENSIVE METABOLIC PANEL - Abnormal; Notable for the following:    Chloride 96 (*)    Glucose, Bld 172 (*)    ALT 9 (*)    Alkaline Phosphatase 36 (*)    All other components within normal limits  CBC WITH DIFFERENTIAL/PLATELET - Abnormal; Notable for the following:    WBC 12.3 (*)    RBC 5.34 (*)    MCV 78.4 (*)    MCH 25.2 (*)    Neutro Abs 9.4 (*)    All other components within normal limits  LACTIC ACID, PLASMA - Abnormal; Notable for the following:    Lactic Acid, Venous 4.7 (*)    All other components within normal limits  URINALYSIS COMPLETEWITH MICROSCOPIC ([facility] ONLY) - Abnormal; Notable for the following:     Color, Urine YELLOW (*)    APPearance CLOUDY (*)    Ketones, ur TRACE (*)    Nitrite POSITIVE (*)    Leukocytes, UA 1+ (*)    Bacteria, UA MANY (*)    Squamous Epithelial / LPF 0-5 (*)    All other components within normal limits  CULTURE, BLOOD (ROUTINE X 2)  CULTURE, BLOOD (ROUTINE X 2)  URINE CULTURE  LACTIC ACID, PLASMA  POCT PREGNANCY, URINE    Imaging Review Dg Chest 2 View  01/12/2016  CLINICAL DATA:  Cough EXAM: CHEST  2 VIEW COMPARISON:  12/25/2015 chest radiograph.  FINDINGS: Low lung volumes. Stable cardiomediastinal silhouette with normal heart size. No pneumothorax. No pleural effusion. No pulmonary edema. No acute consolidative airspace disease. Previously described right upper lobe atelectasis is decreased. IMPRESSION: Decreased right upper lobe atelectasis. No acute consolidative airspace disease. Electronically Signed   By: Delbert Phenix M.D.   On: 01/12/2016 10:05   Ct Angio Chest Pe W/cm &/or Wo Cm  01/12/2016  CLINICAL DATA:  Shortness of breath and chest pain. EXAM: CT ANGIOGRAPHY CHEST WITH CONTRAST TECHNIQUE: Multidetector CT imaging of the chest was performed using the standard protocol during bolus administration of intravenous contrast. Multiplanar CT image reconstructions and MIPs were obtained to evaluate the vascular anatomy. CONTRAST:  OMNIPAQUE IOHEXOL 350 MG/ML SOLN COMPARISON:  Chest x-ray earlier today. FINDINGS: The pulmonary arteries are adequately opacified. There is no evidence of pulmonary embolism. The thoracic aorta is of normal caliber and shows no evidence of aneurysmal disease or dissection. Lungs show focal area of mild patchy airspace opacification in the lingula which may represent subtle pneumonia. No pulmonary edema, pericardial effusion or pleural fluid identified. The heart size is normal. No nodules or enlarged lymph nodes are seen. No evidence of pneumothorax. No airway obstruction. Visualized upper abdomen shows steatosis of the liver.  Bony structures are unremarkable. Review of the MIP images confirms the above findings. IMPRESSION: 1. No evidence of pulmonary embolism. 2. Mild patchy airspace opacification in the lingula may represent subtle pneumonia. Electronically Signed   By: Irish Lack M.D.   On: 01/12/2016 13:38   I have personally reviewed and evaluated these images and lab results as part of my medical decision-making.   EKG Interpretation None       ED ECG REPORT I, Tyrie Porzio, the attending physician, personally viewed and interpreted this ECG.   Date: 01/12/2016  EKG Time: 9:29 am  Rate: 95  Rhythm: normal EKG, normal sinus rhythm  Axis: normal  Intervals:none  ST&T Change: nonspecific    MDM   Final diagnoses:  Shortness of breath    [name] is a 30 y.o. female here with cough. Was noted to be hypotensive 70s in the ED so code sepsis activated. But repeat BP in the room is 130s so code sepsis canceled. Patient is chronically ill and is not very active due to cerebral palsy. Mildly tachy in the ED and has some shortness of breath and wheezing. Consider pneumonia vs bronchitis vs PE. Will get labs, CXR and likely CT angio.   2:30 PM CXR clear. WBC 12. UA still positive despite being on macrobid. Cultures sent. CXR showed pneumonia. Lactate 4.7. Still tachycardic and short of breath. Given levaquin. Will admit for lactic acidosis, CAP.     Richardean Canal, MD 01/12/16 [phone]

## 2016-01-12 NOTE — ED Notes (Signed)
MD at bedside. 

## 2016-01-12 NOTE — ED Notes (Signed)
Lactic acid reported 4.7. Verbally reported to Dr Ernesto Rutherford to see patient now.

## 2016-01-12 NOTE — ED Notes (Signed)
Patient transported to CT 

## 2016-01-12 NOTE — Progress Notes (Signed)
ANTIBIOTIC CONSULT NOTE - INITIAL  Pharmacy Consult for Levaquin  Indication: sepsis, UTI   Allergies  Allergen Reactions  . Contrast Media [Iodinated Diagnostic Agents]   . Latex Other (See Comments)  . Penicillins Rash    Patient Measurements: Height: 5' (152.4 cm) Weight: 202 lb 6.4 oz (91.808 kg) IBW/kg (Calculated) : 45.5 Adjusted Body Weight:   Vital Signs: Temp: 98.5 F (36.9 C) (01/18 1737) Temp Source: Oral (01/18 1737) BP: 155/98 mmHg (01/18 1737) Pulse Rate: 118 (01/18 1737) Intake/Output from previous day:   Intake/Output from this shift:    Labs:  Recent Labs  01/12/16 1002  WBC 12.3*  HGB 13.5  PLT 361  CREATININE 0.68   Estimated Creatinine Clearance: 103.9 mL/min (by C-G formula based on Cr of 0.68). No results for input(s): VANCOTROUGH, VANCOPEAK, VANCORANDOM, GENTTROUGH, GENTPEAK, GENTRANDOM, TOBRATROUGH, TOBRAPEAK, TOBRARND, AMIKACINPEAK, AMIKACINTROU, AMIKACIN in the last 72 hours.   Microbiology: Recent Results (from the past 720 hour(s))  Blood Culture (routine x 2)     Status: None (Preliminary result)   Collection Time: 01/12/16 10:02 AM  Result Value Ref Range Status   Specimen Description BLOOD RIGHT AC  Final   Special Requests BOTTLES DRAWN AEROBIC AND ANAEROBIC  Final   Culture NO GROWTH < 12 HOURS  Final   Report Status PENDING  Incomplete  Blood Culture (routine x 2)     Status: None (Preliminary result)   Collection Time: 01/12/16 11:14 AM  Result Value Ref Range Status   Specimen Description BLOOD RIGHT AC  Final   Special Requests   Final    BOTTLES DRAWN AEROBIC AND ANAEROBIC  ANA 3ML,AER   Culture NO GROWTH < 12 HOURS  Final   Report Status PENDING  Incomplete    Medical History: Past Medical History  Diagnosis Date  . Cerebral palsy ([facility])   . Diabetes mellitus without complication ([facility])   . H/O recurrent urinary tract infection   . Psoriasis   . H/O sickle cell trait   . E. coli UTI (urinary tract  infection)   . BV (bacterial vaginosis)   . Spina bifida ([facility])   . Myasthenia gravis ([facility])   . GERD (gastroesophageal reflux disease)   . Yeast infection   . Hypokalemia   . Heavy periods   . Trichimoniasis   . Hypertension   . Vaginal discharge   . DUB (dysfunctional uterine bleeding)     Medications:  Scheduled:  . darifenacin  15 mg Oral Daily  . famotidine  20 mg Oral Daily  . heparin  5,000 Units Subcutaneous 3 times per day  . insulin aspart  0-5 Units Subcutaneous QHS  . [START ON 01/13/2016] insulin aspart  0-9 Units Subcutaneous TID WC  . [START ON 01/13/2016] levofloxacin (LEVAQUIN) IV  750 mg Intravenous Q24H  . mycophenolate  1,500 mg Oral BID  . norethindrone-ethinyl estradiol  1 tablet Oral Daily  . pentafluoroprop-tetrafluoroeth   Topical STAT  . potassium chloride SA  20 mEq Oral Daily  . [START ON 01/13/2016] predniSONE  5 mg Oral Q breakfast  . pyridostigmine  60 mg Oral 6 times per day  . sodium chloride  3 mL Intravenous Q12H   Assessment: CrCl = 103.9 ml/min   Goal of Therapy:  resolution of infection   Plan:  Expected duration 7 days with resolution of temperature and/or normalization of WBC  Levaquin 750 mg IV X 1 given on 1/18.  Levaquin 750 mg IV Q24H ordered to start  1/19 @ 18:00.   Paublo Warshawsky D 01/12/2016,6:47 PM

## 2016-01-12 NOTE — H&P (Signed)
[facility] - [place] at [facility]   PATIENT NAME: [name]    MR#:  [id number]  DATE OF BIRTH:  September 09, 1986  DATE OF ADMISSION:  01/12/2016  PRIMARY CARE PHYSICIAN: Pcp Not In System   REQUESTING/REFERRING PHYSICIAN: Dr Silverio Lay  CHIEF COMPLAINT:   Chief Complaint  Patient presents with  . Cough    HISTORY OF PRESENT ILLNESS:  [name]  is a 30 y.o. female with a known history of spina bifida, myasthenia gravis, neurogenic bladder requiring self catheterization every 3 hours as well as anti-spasm medication, recurrent urinary tract infections with about 3 weeks of malaise, cough productive of white sputum. She coughs constantly, can't even sleep. She did present to the emergency room 3 weeks ago and was found to have a urinary tract infection treated with Macrobid. There were no cultures collected at that time. She has developed subjective fevers and shortness of breath over the past few days. On emergency room evaluation he is found to be septic with hypotension, tachycardia, elevated lactate. UA is positive for urinary tract infection and CT of the chest shows left lingular pneumonia.  PAST MEDICAL HISTORY:   Past Medical History  Diagnosis Date  . Cerebral palsy (HCC)   . Diabetes mellitus without complication (HCC)   . H/O recurrent urinary tract infection   . Psoriasis   . H/O sickle cell trait   . E. coli UTI (urinary tract infection)   . BV (bacterial vaginosis)   . Spina bifida (HCC)   . Myasthenia gravis (HCC)   . GERD (gastroesophageal reflux disease)   . Yeast infection   . Hypokalemia   . Heavy periods   . Trichimoniasis   . Hypertension   . Vaginal discharge   . DUB (dysfunctional uterine bleeding)     PAST SURGICAL HISTORY:   Past Surgical History  Procedure Laterality Date  . Hysteroscopy w/d&c  2014    benign endometrial polyps  . Adenoidectomy    . Back surgery    . Foot surgery    . Tonsillectomy       SOCIAL HISTORY:   Social History  Substance Use Topics  . Smoking status: Never Smoker   . Smokeless tobacco: Not on file  . Alcohol Use: No    FAMILY HISTORY:   Family History  Problem Relation Age of Onset  . Family history unknown: Yes    DRUG ALLERGIES:   Allergies  Allergen Reactions  . Contrast Media [Iodinated Diagnostic Agents]   . Latex Other (See Comments)  . Penicillins Rash    REVIEW OF SYSTEMS:   Review of Systems  Constitutional: Positive for fever, chills, malaise/fatigue and diaphoresis. Negative for weight loss.  HENT: Positive for congestion and sore throat. Negative for hearing loss.   Eyes: Negative for blurred vision and pain.  Respiratory: Positive for cough, sputum production, shortness of breath and wheezing. Negative for hemoptysis and stridor.   Cardiovascular: Negative for chest pain, palpitations, orthopnea and leg swelling.  Gastrointestinal: Positive for vomiting. Negative for nausea, abdominal pain, diarrhea, constipation and blood in stool.  Genitourinary: Positive for frequency. Negative for dysuria.  Musculoskeletal: Negative for myalgias, back pain, joint pain and neck pain.  Skin: Negative for rash.  Neurological: Positive for weakness. Negative for focal weakness, loss of consciousness and headaches.  Endo/Heme/Allergies: Does not bruise/bleed easily.  Psychiatric/Behavioral: Negative for depression and hallucinations. The patient is not nervous/anxious.     MEDICATIONS AT HOME:   Prior to Admission medications  Medication Sig Start Date End Date Taking? Authorizing Provider  chlorthalidone (HYGROTON) 25 MG tablet Take 25 mg by mouth daily.   Yes Historical Provider, MD  Cholecalciferol (VITAMIN D3) 50000 units CAPS Take 1 capsule by mouth once a week. 11/25/15  Yes Historical Provider, MD  FLUOCINOLONE ACETONIDE BODY 0.01 % OIL Apply 1 application topically daily as needed. 12/29/15  Yes Historical Provider, MD  glyBURIDE  (DIABETA) 5 MG tablet Take 5 mg by mouth 2 (two) times daily with a meal.   Yes Historical Provider, MD  ketoconazole (NIZORAL) 2 % cream Apply daily to flaky areas on face and under right armpit 11/17/15  Yes Historical Provider, MD  lisinopril (PRINIVIL,ZESTRIL) 5 MG tablet Take 5 mg by mouth daily.   Yes Historical Provider, MD  loratadine (CLARITIN) 10 MG tablet Take 10 mg by mouth daily.   Yes Historical Provider, MD  metFORMIN (GLUCOPHAGE) 500 MG tablet Take 500 mg by mouth 2 (two) times daily with a meal.   Yes Historical Provider, MD  mycophenolate (CELLCEPT) 500 MG tablet Take 1,500 mg by mouth 2 (two) times daily.   Yes Historical Provider, MD  norethindrone-ethinyl estradiol (JUNEL FE 1/20) 1-20 MG-MCG tablet Take 1 tablet by mouth daily. 12/07/15  Yes Prentice Docker Defrancesco, MD  potassium chloride SA (K-DUR,KLOR-CON) 20 MEQ tablet Take 20 mEq by mouth daily.   Yes Historical Provider, MD  predniSONE (DELTASONE) 10 MG tablet Take 5 mg by mouth daily with breakfast.    Yes Historical Provider, MD  pyridostigmine (MESTINON) 60 MG tablet Take 60 mg by mouth every 4 (four) hours.   Yes Historical Provider, MD  ranitidine (ZANTAC) 150 MG tablet Take 150 mg by mouth 2 (two) times daily.   Yes Historical Provider, MD  solifenacin (VESICARE) 10 MG tablet Take 10 mg by mouth daily.   Yes Historical Provider, MD      VITAL SIGNS:  Blood pressure 137/76, pulse 110, temperature 98.2 F (36.8 C), temperature source Oral, resp. rate 21, height 5' (1.524 m), weight 90.719 kg (200 lb), last menstrual period 12/25/2015, SpO2 100 %.  PHYSICAL EXAMINATION:  GENERAL:  30 y.o.-year-old patient lying in the bed with no acute distress.  EYES: Pupils equal, round, reactive to light and accommodation. No scleral icterus. Extraocular muscles intact.  HEENT: Head atraumatic, normocephalic. Oropharynx and nasopharynx clear. Mucous membranes pink and moist. Her face is round. NECK:  Supple, no jugular venous  distention. No thyroid enlargement, no tenderness.  LUNGS: Normal breath sounds bilaterally, no wheezing, rales,rhonchi or crepitation. No use of accessory muscles of respiration. Dry cough with deep inspiration CARDIOVASCULAR: S1, S2 normal. No murmurs, rubs, or gallops. Tachycardic ABDOMEN: Soft, nontender, nondistended. Bowel sounds present. No organomegaly or mass. No guarding no rebound EXTREMITIES: No pedal edema, cyanosis, or clubbing. Bilateral lower extremities are in hard brace NEUROLOGIC: Cranial nerves II through XII are grossly intact.  PSYCHIATRIC: The patient is alert and oriented x 3.  SKIN: No obvious rash, lesion, or ulcer.   LABORATORY PANEL:   CBC  Recent Labs Lab 01/12/16 1002  WBC 12.3*  HGB 13.5  HCT 41.9  PLT 361   ------------------------------------------------------------------------------------------------------------------  Chemistries   Recent Labs Lab 01/12/16 1002  NA 136  K 3.5  CL 96*  CO2 27  GLUCOSE 172*  BUN 10  CREATININE 0.68  CALCIUM 9.2  AST 19  ALT 9*  ALKPHOS 36*  BILITOT 0.3   ------------------------------------------------------------------------------------------------------------------  Cardiac Enzymes No results for input(s): TROPONINI in the last  168 hours. ------------------------------------------------------------------------------------------------------------------  RADIOLOGY:  Dg Chest 2 View  01/12/2016  CLINICAL DATA:  Cough EXAM: CHEST  2 VIEW COMPARISON:  12/25/2015 chest radiograph. FINDINGS: Low lung volumes. Stable cardiomediastinal silhouette with normal heart size. No pneumothorax. No pleural effusion. No pulmonary edema. No acute consolidative airspace disease. Previously described right upper lobe atelectasis is decreased. IMPRESSION: Decreased right upper lobe atelectasis. No acute consolidative airspace disease. Electronically Signed   By: Delbert Phenix M.D.   On: 01/12/2016 10:05   Ct Angio Chest  Pe W/cm &/or Wo Cm  01/12/2016  CLINICAL DATA:  Shortness of breath and chest pain. EXAM: CT ANGIOGRAPHY CHEST WITH CONTRAST TECHNIQUE: Multidetector CT imaging of the chest was performed using the standard protocol during bolus administration of intravenous contrast. Multiplanar CT image reconstructions and MIPs were obtained to evaluate the vascular anatomy. CONTRAST:  OMNIPAQUE IOHEXOL 350 MG/ML SOLN COMPARISON:  Chest x-ray earlier today. FINDINGS: The pulmonary arteries are adequately opacified. There is no evidence of pulmonary embolism. The thoracic aorta is of normal caliber and shows no evidence of aneurysmal disease or dissection. Lungs show focal area of mild patchy airspace opacification in the lingula which may represent subtle pneumonia. No pulmonary edema, pericardial effusion or pleural fluid identified. The heart size is normal. No nodules or enlarged lymph nodes are seen. No evidence of pneumothorax. No airway obstruction. Visualized upper abdomen shows steatosis of the liver. Bony structures are unremarkable. Review of the MIP images confirms the above findings. IMPRESSION: 1. No evidence of pulmonary embolism. 2. Mild patchy airspace opacification in the lingula may represent subtle pneumonia. Electronically Signed   By: Irish Lack M.D.   On: 01/12/2016 13:38    EKG:   Orders placed or performed during the hospital encounter of 01/12/16  . ED EKG  . ED EKG    IMPRESSION AND PLAN:   1. Sepsis: Meeting criteria with hypotension, tachycardia, elevated lactic acid, urinary tract infection. Responding well to fluids has received 2 L of normal saline. Continue hydration. Continue to monitor on telemetry. Blood, urine cultures pending  2. Pneumonia: CT scan showing mild patchy opacification of the lingula representing possible pneumonia. Continue Levaquin. Continue cough relief  3. Urinary tract infection: Patient states she has chronic recurrent urinary tract infections and  is seen at [facility] urology for neurogenic bladder. UA supports current urinary tract infection. Culture is pending. Continue Levaquin for now. Do not see microbiology data from [facility]. Last culture at [facility] is pansensitive Escherichia coli. She would like to consider chronic suppressive treatment  4. hypertention: Hypotensive at presentation. Hold antihypertensives for now.  5. Neurogenic bladder: Continue Vesicare. Continue every 3 hour self catheter  6. Diabetes mellitus type 2: Check hemoglobin A1c. Start sliding scale. Hold oral hypoglycemic agents for now  7. myasthenia gravis: no exacerbation at this time. Continue CellCept, Mestinon and prednisone. She is tapering prednisone and would like to come off of this medication. Follows with [facility] neurology  8. Prophylaxis: Heparin for DVT prophylaxis. Continue Zantac.    All the records are reviewed and case discussed with ED provider. Management plans discussed with the patient, family including her husband and they are in agreement.  CODE STATUS: Full  TOTAL TIME TAKING CARE OF THIS PATIENT: 55 minutes.  Greater than 50% of time spent in coordination of care and counseling.  Elby Showers M.D on 01/12/2016 at 3:41 PM  Between 7am to 6pm - Pager - [phone]  After 6pm go to [email or web address] - password [email or web address]  [facility]  Office  [phone]  CC: Primary care physician; Pcp Not In System

## 2016-01-13 LAB — GLUCOSE, CAPILLARY
GLUCOSE-CAPILLARY: 268 mg/dL — AB (ref 65–99)
GLUCOSE-CAPILLARY: 347 mg/dL — AB (ref 65–99)
Glucose-Capillary: 288 mg/dL — ABNORMAL HIGH (ref 65–99)
Glucose-Capillary: 282 mg/dL — ABNORMAL HIGH (ref 65–99)

## 2016-01-13 LAB — CBC
HEMATOCRIT: 37.5 % (ref 35.0–47.0)
HEMOGLOBIN: 12.4 g/dL (ref 12.0–16.0)
MCH: 26 pg (ref 26.0–34.0)
MCHC: 33.1 g/dL (ref 32.0–36.0)
MCV: 78.5 fL — AB (ref 80.0–100.0)
Platelets: 360 10*3/uL (ref 150–440)
RBC: 4.77 MIL/uL (ref 3.80–5.20)
RDW: 13.6 % (ref 11.5–14.5)
WBC: 14.2 10*3/uL — ABNORMAL HIGH (ref 3.6–11.0)

## 2016-01-13 LAB — BASIC METABOLIC PANEL
Anion gap: 11 (ref 5–15)
BUN: 10 mg/dL (ref 6–20)
CALCIUM: 8.8 mg/dL — AB (ref 8.9–10.3)
CHLORIDE: 96 mmol/L — AB (ref 101–111)
CO2: 27 mmol/L (ref 22–32)
CREATININE: 0.69 mg/dL (ref 0.44–1.00)
GFR calc Af Amer: >60 mL/min (ref >60)
GFR calc non Af Amer: >60 mL/min (ref >60)
GLUCOSE: 277 mg/dL — AB (ref 65–99)
Potassium: 3.1 mmol/L — ABNORMAL LOW (ref 3.5–5.1)
Sodium: 134 mmol/L — ABNORMAL LOW (ref 135–145)

## 2016-01-13 LAB — HEMOGLOBIN A1C: Hgb A1c MFr Bld: 8.6 % — ABNORMAL HIGH (ref 4.0–6.0)

## 2016-01-13 MED ORDER — LISINOPRIL 5 MG PO TABS
5.0000 mg | ORAL_TABLET | Freq: Every day | ORAL | Status: DC
  Administered 2016-01-13 – 2016-01-15 (×3): 5 mg via ORAL
  Filled 2016-01-13 (×3): qty 1

## 2016-01-13 MED ORDER — ENOXAPARIN SODIUM 40 MG/0.4ML ~~LOC~~ SOLN
40.0000 mg | SUBCUTANEOUS | Status: DC
  Administered 2016-01-13 – 2016-01-14 (×2): 40 mg via SUBCUTANEOUS
  Filled 2016-01-13 (×2): qty 0.4

## 2016-01-13 MED ORDER — HYDROCOD POLST-CPM POLST ER 10-8 MG/5ML PO SUER
5.0000 mL | Freq: Two times a day (BID) | ORAL | Status: DC | PRN
  Administered 2016-01-13 – 2016-01-15 (×5): 5 mL via ORAL
  Filled 2016-01-13 (×5): qty 5

## 2016-01-13 MED ORDER — INSULIN ASPART 100 UNIT/ML ~~LOC~~ SOLN
0.0000 [IU] | Freq: Three times a day (TID) | SUBCUTANEOUS | Status: DC
  Administered 2016-01-13 – 2016-01-14 (×2): 5 [IU] via SUBCUTANEOUS
  Administered 2016-01-14: 3 [IU] via SUBCUTANEOUS
  Administered 2016-01-14: 5 [IU] via SUBCUTANEOUS
  Filled 2016-01-13 (×3): qty 5

## 2016-01-13 MED ORDER — LIVING WELL WITH DIABETES BOOK
Freq: Once | Status: AC
  Administered 2016-01-13: 12:00:00
  Filled 2016-01-13 (×2): qty 1

## 2016-01-13 MED ORDER — INSULIN GLARGINE 100 UNIT/ML ~~LOC~~ SOLN
10.0000 [IU] | Freq: Every day | SUBCUTANEOUS | Status: DC
  Administered 2016-01-13 – 2016-01-14 (×2): 10 [IU] via SUBCUTANEOUS
  Filled 2016-01-13 (×3): qty 0.1

## 2016-01-13 MED ORDER — OXYBUTYNIN CHLORIDE 5 MG PO TABS: 5.0000 mg | ORAL_TABLET | Freq: Three times a day (TID) | ORAL | Status: DC

## 2016-01-13 MED ORDER — POTASSIUM CHLORIDE CRYS ER 20 MEQ PO TBCR
40.0000 meq | EXTENDED_RELEASE_TABLET | Freq: Once | ORAL | Status: AC
  Administered 2016-01-13: 40 meq via ORAL
  Filled 2016-01-13: qty 2

## 2016-01-13 NOTE — Progress Notes (Signed)
Inpatient Diabetes Program Recommendations  AACE/ADA: New Consensus Statement on Inpatient Glycemic Control (2015)  Target Ranges:  Prepandial:   less than 140 mg/dL      Peak postprandial:   less than 180 mg/dL (1-2 hours)      Critically ill patients:  140 - 180 mg/dL   Review of Glycemic Control  Results for [name], [name] (MRN [id number]) as of 01/13/2016 09:46  Ref. Range 08/04/2015 19:36 12/25/2015 11:10 01/12/2016 20:46 01/13/2016 07:24  Glucose-Capillary Latest Ref Range: 65-99 mg/dL 409 (H) 811 (H) 914 (H) 288 (H)   Diabetes History: A1C  8.6% Outpatient Diabetes medications: Glyburide  bid, Glucophage  bid Current orders for Inpatient glycemic control: Novolog 0-9 units tid, Novolog 0-5 units qhs   *prednisone daily with breakfast  Inpatient Diabetes Program Recommendations: Fasting blood sugar /dl; consider starting low dose basal insulin 10 units qhs (0.11unit/kg), consider increasing Novolog insulin to moderate correction scale (weight and steroids) 0-15 units tid  Susette Racer, RN, [place], [place], CDE Diabetes Coordinator Inpatient Diabetes Program  [phone] (Team Pager) [phone] [facility] Office) 01/13/2016 9:55 AM

## 2016-01-13 NOTE — Progress Notes (Signed)
Nutrition Follow-up   INTERVENTION:   Meals and Snacks: Cater to patient preferences Education: pt with no prior education on diabetic diet; reviewed sources of carbs, basic carb counting and meal planning; discouraged sugar sweetened beverages and discussed alternatives as pt reports she drinks a lot of liquids due to UTI and typically chooses sugary drinks. Pt receptive to education, adherence likely  NUTRITION DIAGNOSIS:   Food and nutrition related knowledge deficit related to chronic illness as evidenced by  (consult for diet education).  GOAL:   Patient will meet greater than or equal to 90% of their needs  MONITOR:    (Energy Intake, Anthropometrics, Digestive System, Electrolyte/Renal Profile)  REASON FOR ASSESSMENT:   Consult Diet education  ASSESSMENT:    Pt admitted with sepsis with pneumonia and UTI, poorly controlled DM  Past Medical History  Diagnosis Date  . Cerebral palsy (HCC)   . Diabetes mellitus without complication (HCC)   . H/O recurrent urinary tract infection   . Psoriasis   . H/O sickle cell trait   . E. coli UTI (urinary tract infection)   . BV (bacterial vaginosis)   . Spina bifida (HCC)   . Myasthenia gravis (HCC)   . GERD (gastroesophageal reflux disease)   . Yeast infection   . Hypokalemia   . Heavy periods   . Trichimoniasis   . Hypertension   . Vaginal discharge   . DUB (dysfunctional uterine bleeding)      Diet Order:  Diet Carb Modified Fluid consistency:: Thin; Room service appropriate?: Yes   Energy Intake: recorded po intake 100% of meals, appetite very good  Food and nutrition related history: pt reports good appetite at home  Electrolyte and Renal Profile:  Recent Labs Lab 01/12/16 1002 01/13/16 0430  BUN 10 10  CREATININE 0.68 0.69  NA 136 134*  K 3.5 3.1*   Glucose Profile:  Recent Labs  01/12/16 2046 01/13/16 0724 01/13/16 1110  GLUCAP 371* 288* 282*   Meds: ss novolog, lantus, potassium  chloride  Height:   Ht Readings from Last 1 Encounters:  01/12/16 5' (1.524 m)    Weight:   Wt Readings from Last 1 Encounters:  01/12/16 202 lb 6.4 oz (91.808 kg)   Wt Readings from Last 10 Encounters:  01/12/16 202 lb 6.4 oz (91.808 kg)  12/25/15 200 lb (90.719 kg)  12/07/15 203 lb 4.8 oz (92.216 kg)  08/04/15 200 lb (90.719 kg)    BMI:  Body mass index is 39.53 kg/(m^2).  LOW Care Level  Romelle Starcher MS, [place], LDN [phone] Pager  [phone] Weekend/On-Call Pager

## 2016-01-13 NOTE — Progress Notes (Signed)
Inpatient Diabetes Program Recommendations  AACE/ADA: New Consensus Statement on Inpatient Glycemic Control (2015)  Target Ranges:  Prepandial:   less than 140 mg/dL      Peak postprandial:   less than 180 mg/dL (1-2 hours)      Critically ill patients:  140 - 180 mg/dL   Review of Glycemic Control  Results for [name], [name] (MRN [id number]) as of 01/13/2016 11:50  Ref. Range 08/04/2015 19:36 12/25/2015 11:10 01/12/2016 20:46 01/13/2016 07:24 01/13/2016 11:10  Glucose-Capillary Latest Ref Range: 65-99 mg/dL 409 (H) 811 (H) 914 (H) 288 (H) 282 (H)    Spoke to patient and her husband at the bedside.  She did not know what an A1C is or what carbohydrates are.  Reviewed goals for A1C.  Although her primary care MD wants her to check blood sugars 2x/day, she has not been consistent about checking them. She tells me she's consistent about taking her oral meds and sees her MD about q2months.  Discussed insulin use while inpatient.  Living well with Diabetes ordered and dietitian consult requested for further education.  Susette Racer, RN, BA, MHA, CDE Diabetes Coordinator Inpatient Diabetes Program  [phone] (Team Pager) [phone] [facility] Office) 01/13/2016 11:53 AM

## 2016-01-13 NOTE — Progress Notes (Signed)
[facility] - [place] at [facility]   PATIENT NAME: [name]    MR#:  [id number]  DATE OF BIRTH:  January 11, 1986  SUBJECTIVE:  CHIEF COMPLAINT:  Pt is coughing and asking cough medicine.  Refused labs today would rather get them tomorrow 2/2 myasthenia gravis  REVIEW OF SYSTEMS:  CONSTITUTIONAL: No fever, fatigue or weakness.  EYES: No blurred or double vision.  EARS, NOSE, AND THROAT: No tinnitus or ear pain.  RESPIRATORY:Has  cough, no shortness of breath, wheezing or hemoptysis.  CARDIOVASCULAR: No chest pain, orthopnea, edema.  GASTROINTESTINAL: No nausea, vomiting, diarrhea or abdominal pain.  GENITOURINARY: No dysuria, hematuria.  ENDOCRINE: No polyuria, nocturia,  HEMATOLOGY: No anemia, easy bruising or bleeding SKIN: No rash or lesion. MUSCULOSKELETAL: No joint pain or arthritis.   NEUROLOGIC: No tingling, numbness, weakness.  PSYCHIATRY: No anxiety or depression.   DRUG ALLERGIES:   Allergies  Allergen Reactions  . Contrast Media [Iodinated Diagnostic Agents]   . Latex Other (See Comments)  . Penicillins Rash    VITALS:  Blood pressure 138/81, pulse 99, temperature 97.9 F (36.6 C), temperature source Oral, resp. rate 18, height 5' (1.524 m), weight 91.808 kg (202 lb 6.4 oz), last menstrual period 12/25/2015, SpO2 99 %.  PHYSICAL EXAMINATION:  GENERAL:  30 y.o.-year-old patient lying in the bed with no acute distress.  EYES: Pupils equal, round, reactive to light and accommodation. No scleral icterus. Extraocular muscles intact.  HEENT: Head atraumatic, normocephalic. Oropharynx and nasopharynx clear.  NECK:  Supple, no jugular venous distention. No thyroid enlargement, no tenderness.  LUNGS: Coarse breath sounds bilaterally, no wheezing, rales,rhonchi or crepitation. No use of accessory muscles of respiration.  CARDIOVASCULAR: S1, S2 normal. No murmurs, rubs, or gallops.  ABDOMEN: Soft, nontender, nondistended. Bowel sounds present.  No organomegaly or mass.  EXTREMITIES: No pedal edema, cyanosis, or clubbing.  NEUROLOGIC: Cranial nerves II through XII are intact. Muscle strength 5/5 in all extremities. Sensation intact. Gait not checked.  PSYCHIATRIC: The patient is alert and oriented x 3.  SKIN: No obvious rash, lesion, or ulcer.    LABORATORY PANEL:   CBC  Recent Labs Lab 01/13/16 0430  WBC 14.2*  HGB 12.4  HCT 37.5  PLT 360   ------------------------------------------------------------------------------------------------------------------  Chemistries   Recent Labs Lab 01/12/16 1002 01/13/16 0430  NA 136 134*  K 3.5 3.1*  CL 96* 96*  CO2 27 27  GLUCOSE 172* 277*  BUN 10 10  CREATININE 0.68 0.69  CALCIUM 9.2 8.8*  AST 19  --   ALT 9*  --   ALKPHOS 36*  --   BILITOT 0.3  --    ------------------------------------------------------------------------------------------------------------------  Cardiac Enzymes No results for input(s): TROPONINI in the last 168 hours. ------------------------------------------------------------------------------------------------------------------  RADIOLOGY:  Dg Chest 2 View  01/12/2016  CLINICAL DATA:  Cough EXAM: CHEST  2 VIEW COMPARISON:  12/25/2015 chest radiograph. FINDINGS: Low lung volumes. Stable cardiomediastinal silhouette with normal heart size. No pneumothorax. No pleural effusion. No pulmonary edema. No acute consolidative airspace disease. Previously described right upper lobe atelectasis is decreased. IMPRESSION: Decreased right upper lobe atelectasis. No acute consolidative airspace disease. Electronically Signed   By: Delbert Phenix M.D.   On: 01/12/2016 10:05   Ct Angio Chest Pe W/cm &/or Wo Cm  01/12/2016  CLINICAL DATA:  Shortness of breath and chest pain. EXAM: CT ANGIOGRAPHY CHEST WITH CONTRAST TECHNIQUE: Multidetector CT imaging of the chest was performed using the standard protocol during bolus administration of intravenous contrast.  Multiplanar CT image reconstructions and MIPs were obtained to evaluate the vascular anatomy. CONTRAST:  OMNIPAQUE IOHEXOL 350 MG/ML SOLN COMPARISON:  Chest x-ray earlier today. FINDINGS: The pulmonary arteries are adequately opacified. There is no evidence of pulmonary embolism. The thoracic aorta is of normal caliber and shows no evidence of aneurysmal disease or dissection. Lungs show focal area of mild patchy airspace opacification in the lingula which may represent subtle pneumonia. No pulmonary edema, pericardial effusion or pleural fluid identified. The heart size is normal. No nodules or enlarged lymph nodes are seen. No evidence of pneumothorax. No airway obstruction. Visualized upper abdomen shows steatosis of the liver. Bony structures are unremarkable. Review of the MIP images confirms the above findings. IMPRESSION: 1. No evidence of pulmonary embolism. 2. Mild patchy airspace opacification in the lingula may represent subtle pneumonia. Electronically Signed   By: Irish Lack M.D.   On: 01/12/2016 13:38    EKG:   Orders placed or performed during the hospital encounter of 01/12/16  . ED EKG  . ED EKG    ASSESSMENT AND PLAN:   1. Sepsis: Meeting criteria with hypotension, tachycardia, elevated lactic acid, 2/2  urinary tract infection and PNA  - has received 2 L of normal saline. Continue hydration.  Continue to monitor on telemetry.  Blood cultures ntd Urine cultures with >100,000 colonies pending  2. Pneumonia: CT scan showing mild patchy opacification of the lingula representing possible pneumonia.  Continue Levaquin.  Continue antitussives  3. Urinary tract infection: Patient states she has chronic recurrent urinary tract infections and is seen at [facility] urology for neurogenic bladder.  UA supports current urinary tract infection.  Urine cultures with >100,000 colonies pending  Continue Levaquin for now. Do not see microbiology data from [facility]. Last culture at [facility] is  pansensitive Escherichia coli.  She would like to consider chronic suppressive treatment  4. hypertention: Hypotensive at presentation. Bp is better , resume antihypertensives   5. Neurogenic bladder, ch h/o spinabifida -  Continue Vesicare. Continue every 3 hour self catheter  6. Diabetes mellitus type 2: hemoglobin A1c 8.6  sliding scale. Hold oral hypoglycemic agents for now  7. myasthenia gravis: no exacerbation at this time.  Continue CellCept, Mestinon and prednisone . She is tapering prednisone and would like to come off of this medication.  OP f/u with  [facility] neurology  8. Prophylaxis: Heparin for DVT prophylaxis. Continue Zantac.      All the records are reviewed and case discussed with Care Management/Social Workerr. Management plans discussed with the patient, family and they are in agreement.  CODE STATUS: fc  TOTAL TIME TAKING CARE OF THIS PATIENT: 35 minutes.   POSSIBLE D/C IN 2 DAYS, DEPENDING ON CLINICAL CONDITION.   Ramonita Lab M.D on 01/13/2016 at 8:24 PM  Between 7am to 6pm - Pager - [phone] After 6pm go to [email or web address] - password [email or web address]  [facility]  Office  [phone]  CC: Primary care physician; Pcp Not In System

## 2016-01-13 NOTE — Progress Notes (Addendum)
Pt has stated that she will refuse lab blood draws from this point on. RN explained that lab will continue to offer in case pt changes her mind.

## 2016-01-14 LAB — BASIC METABOLIC PANEL
ANION GAP: 10 (ref 5–15)
BUN: 13 mg/dL (ref 6–20)
CALCIUM: 8.9 mg/dL (ref 8.9–10.3)
CO2: 29 mmol/L (ref 22–32)
CREATININE: 0.63 mg/dL (ref 0.44–1.00)
Chloride: 100 mmol/L — ABNORMAL LOW (ref 101–111)
GFR calc Af Amer: >60 mL/min (ref >60)
GFR calc non Af Amer: >60 mL/min (ref >60)
GLUCOSE: 150 mg/dL — AB (ref 65–99)
Potassium: 3.5 mmol/L (ref 3.5–5.1)
Sodium: 139 mmol/L (ref 135–145)

## 2016-01-14 LAB — CBC
HEMATOCRIT: 36.7 % (ref 35.0–47.0)
HEMOGLOBIN: 12.1 g/dL (ref 12.0–16.0)
MCH: 25.3 pg — AB (ref 26.0–34.0)
MCHC: 32.9 g/dL (ref 32.0–36.0)
MCV: 77 fL — ABNORMAL LOW (ref 80.0–100.0)
Platelets: 272 10*3/uL (ref 150–440)
RBC: 4.76 MIL/uL (ref 3.80–5.20)
RDW: 13.4 % (ref 11.5–14.5)
WBC: 14.8 10*3/uL — ABNORMAL HIGH (ref 3.6–11.0)

## 2016-01-14 LAB — MAGNESIUM: Magnesium: 2 mg/dL (ref 1.7–2.4)

## 2016-01-14 LAB — GLUCOSE, CAPILLARY
GLUCOSE-CAPILLARY: 244 mg/dL — AB (ref 65–99)
Glucose-Capillary: 156 mg/dL — ABNORMAL HIGH (ref 65–99)
Glucose-Capillary: 238 mg/dL — ABNORMAL HIGH (ref 65–99)
Glucose-Capillary: 176 mg/dL — ABNORMAL HIGH (ref 65–99)

## 2016-01-14 MED ORDER — LEVOFLOXACIN 750 MG PO TABS: 750.0000 mg | ORAL_TABLET | ORAL | Status: DC

## 2016-01-14 MED ORDER — LEVOFLOXACIN 750 MG PO TABS
750.0000 mg | ORAL_TABLET | ORAL | Status: DC
  Administered 2016-01-14: 750 mg via ORAL
  Filled 2016-01-14: qty 1

## 2016-01-14 MED ORDER — LEVOFLOXACIN IN D5W 750 MG/150ML IV SOLN
750.0000 mg | INTRAVENOUS | Status: DC
  Filled 2016-01-14: qty 150

## 2016-01-14 NOTE — Progress Notes (Signed)
NSR. Room air. FS are stable. Self cath. Takes meds ok. Husband at the bedside. No pain. Pt has no further concerns at this time.

## 2016-01-14 NOTE — Progress Notes (Signed)
Inpatient Diabetes Program Recommendations  AACE/ADA: New Consensus Statement on Inpatient Glycemic Control (2015)  Target Ranges:  Prepandial:   less than 140 mg/dL      Peak postprandial:   less than 180 mg/dL (1-2 hours)      Critically ill patients:  140 - 180 mg/dL   Review of Glycemic Control  Diabetes History: A1C 8.6% Outpatient Diabetes medications: Glyburide  bid, Glucophage  bid Current orders for Inpatient glycemic control: Novolog 0-15 units tid, Novolog 0-5 units qhs, Lantus 10 units qhs *prednisone daily with breakfast  Inpatient Diabetes Program Recommendations:  Consider adding Novolog 5 units tid with meals- continue Novolog correction as ordered.  Susette Racer, RN, BA, MHA, CDE Diabetes Coordinator Inpatient Diabetes Program  [phone] (Team Pager) [phone] [facility] Office) 01/14/2016 3:12 PM

## 2016-01-14 NOTE — Progress Notes (Signed)
Pharmacy Antibiotic Follow-up Note  [name] is a 30 y.o. year-old female admitted on 01/12/2016.  The patient is currently on day 3 of levofloxacin for PNA and UTI.  Assessment/Plan: After discussion with Dr. Amado Coe, the length of therapy for levofloxacin for CAP is 5-7 days. The stop date is entered and will be 1/24.  Temp (24hrs), Avg:98.2 F (36.8 C), Min:97.7 F (36.5 C), Max:98.6 F (37 C)   Recent Labs Lab 01/12/16 1002 01/13/16 0430 01/14/16 0442  WBC 12.3* 14.2* 14.8*    Recent Labs Lab 01/12/16 1002 01/13/16 0430 01/14/16 0442  CREATININE 0.68 0.69 0.63   Estimated Creatinine Clearance: 103.9 mL/min (by C-G formula based on Cr of 0.63).    Allergies  Allergen Reactions  . Contrast Media [Iodinated Diagnostic Agents]   . Latex Other (See Comments)  . Penicillins Rash    Antimicrobials this admission: 1/18 levofloxacin >> 1/24   Levels/dose changes this admission:   Microbiology results: 1/18 BCx: neg  UCx: <100,000 ecoli, 80,000 gram neg rod    Thank you for allowing pharmacy to be a part of this patient's care.  Olene Floss PharmD 01/14/2016 3:20 PM

## 2016-01-14 NOTE — Care Management Important Message (Signed)
Important Message  Patient Details  Name: [name] MRN: [id number] Date of Birth: 1986/02/04   Medicare Important Message Given:  Yes    Aylyn Wenzler A, RN 01/14/2016, 8:04 AM

## 2016-01-14 NOTE — Progress Notes (Signed)
[facility] Physicians - [place] at [facility]   PATIENT NAME: [name]    MR#:  [id number]  DATE OF BIRTH:  [birth year]  SUBJECTIVE:  CHIEF COMPLAINT:  Pt's cough is better reporting generalized weakness. Patient does self catheterization every 3 hours in view of myasthenia gravis and spinabifida  REVIEW OF SYSTEMS:  CONSTITUTIONAL: No fever, fatigue or weakness.  EYES: No blurred or double vision.  EARS, NOSE, AND THROAT: No tinnitus or ear pain.  RESPIRATORY:Has  cough, no shortness of breath, wheezing or hemoptysis.  CARDIOVASCULAR: No chest pain, orthopnea, edema.  GASTROINTESTINAL: No nausea, vomiting, diarrhea or abdominal pain.  GENITOURINARY: No dysuria, hematuria.  ENDOCRINE: No polyuria, nocturia,  HEMATOLOGY: No anemia, easy bruising or bleeding SKIN: No rash or lesion. MUSCULOSKELETAL: No joint pain or arthritis.   NEUROLOGIC: No tingling, numbness, weakness.  PSYCHIATRY: No anxiety or depression.   DRUG ALLERGIES:   Allergies  Allergen Reactions  . Contrast Media [Iodinated Diagnostic Agents]   . Latex Other (See Comments)  . Penicillins Rash    VITALS:  Blood pressure 122/82, pulse 90, temperature 98.4 F (36.9 C), temperature source Oral, resp. rate 18, height 5' (1.524 m), weight 91.808 kg (202 lb 6.4 oz), last menstrual period 12/25/2015, SpO2 97 %.  PHYSICAL EXAMINATION:  GENERAL:  30 y.o.-year-old patient lying in the bed with no acute distress.  EYES: Pupils equal, round, reactive to light and accommodation. No scleral icterus. Extraocular muscles intact.  HEENT: Head atraumatic, normocephalic. Oropharynx and nasopharynx clear.  NECK:  Supple, no jugular venous distention. No thyroid enlargement, no tenderness.  LUNGS: Coarse breath sounds bilaterally, no wheezing, rales,rhonchi or crepitation. No use of accessory muscles of respiration.  CARDIOVASCULAR: S1, S2 normal. No murmurs, rubs, or gallops.  ABDOMEN: Soft, nontender,  nondistended. Bowel sounds present. No organomegaly or mass.  EXTREMITIES: No pedal edema, cyanosis, or clubbing.  NEUROLOGIC: Cranial nerves II through XII are intact. Muscle strength 5/5 in all extremities. Sensation intact. Gait not checked.  PSYCHIATRIC: The patient is alert and oriented x 3.  SKIN: No obvious rash, lesion, or ulcer.    LABORATORY PANEL:   CBC  Recent Labs Lab 01/14/16 0442  WBC 14.8*  HGB 12.1  HCT 36.7  PLT 272   ------------------------------------------------------------------------------------------------------------------  Chemistries   Recent Labs Lab 01/12/16 1002  01/14/16 0442  NA 136  < > 139  K 3.5  < > 3.5  CL 96*  < > 100*  CO2 27  < > 29  GLUCOSE 172*  < > 150*  BUN 10  < > 13  CREATININE 0.68  < > 0.63  CALCIUM 9.2  < > 8.9  MG  --   --  2.0  AST 19  --   --   ALT 9*  --   --   ALKPHOS 36*  --   --   BILITOT 0.3  --   --   < > = values in this interval not displayed. ------------------------------------------------------------------------------------------------------------------  Cardiac Enzymes No results for input(s): TROPONINI in the last 168 hours. ------------------------------------------------------------------------------------------------------------------  RADIOLOGY:  No results found.  EKG:   Orders placed or performed during the hospital encounter of 01/12/16  . ED EKG  . ED EKG    ASSESSMENT AND PLAN:   1. Sepsis: Meeting criteria with hypotension, tachycardia, elevated lactic acid, 2/2  urinary tract infection and PNA  - has received 2 L of normal saline. Continue hydration.  Continue to monitor on telemetry.  Blood cultures  ntd   2. Pneumonia: CT scan showing mild patchy opacification of the lingula representing possible pneumonia.  Continue Levaquin, changed to by mouth  Continue antitussives  3. Urinary tract infection: Patient states she has chronic recurrent urinary tract infections and is  seen at [facility] urology for neurogenic bladder.  UA supports current urinary tract infection.  Urine cultures with >100,000 colonies Escherichia coli sensitive to levofloxacin and ciprofloxacin. Urine culture also reveals gram-negative rods greater than 80,000 colonies, sensitivities pending  continue Levaquin for now. Do not see microbiology data from [facility].  She would like to consider chronic suppressive treatment at the time of discharge   4. hypertention: Hypotensive at presentation. Bp is better , resume antihypertensives   5. Neurogenic bladder, ch h/o spinabifida -  Continue Vesicare. Continue every 3 hour self catheter  6. Diabetes mellitus type 2: hemoglobin A1c 8.6  sliding scale. Hold oral hypoglycemic agents for now  7. myasthenia gravis: no exacerbation at this time.  Continue CellCept, Mestinon and prednisone . She is tapering prednisone and would like to come off of this medication.  OP f/u with  [facility] neurology  8. Prophylaxis: Heparin for DVT prophylaxis. Continue Zantac.   Deconditioning-encouraged patient to get out of the bed and ambulate in the hallway each shift   All the records are reviewed and case discussed with Care Management/Social Workerr. Management plans discussed with the patient, family and they are in agreement.  CODE STATUS: fc  TOTAL TIME TAKING CARE OF THIS PATIENT: 35 minutes.   POSSIBLE D/C IN am DAYS, DEPENDING ON CLINICAL CONDITION.   Ramonita Lab M.D on 01/14/2016 at 2:06 PM  Between 7am to 6pm - Pager - [phone] After 6pm go to [email or web address] - password [email or web address]  [facility]  Office  [phone]  CC: Primary care physician; Pcp Not In System

## 2016-01-14 NOTE — Care Management Note (Signed)
Case Management Note  Patient Details  Name: [name] MRN: [id number] Date of Birth: 1986-12-03  Subjective/Objective:   RNCM assessment. Met with patient. She denies any home health needs, financial concerns or transportation concerns. Lives at home with her spouse. Uses a cane and is independent otherwise. No needs identified.                 Action/Plan: No needs identified, case closed.   Expected Discharge Date:                  Expected Discharge Plan:     In-House Referral:     Discharge planning Services     Post Acute Care Choice:    Choice offered to:     DME Arranged:    DME Agency:     HH Arranged:    [place] Agency:     Status of Service:     Medicare Important Message Given:  Yes Date Medicare IM Given:    Medicare IM give by:    Date Additional Medicare IM Given:    Additional Medicare Important Message give by:     If discussed at [place] of Stay Meetings, dates discussed:    Additional Comments:  Jolly Mango, RN 01/14/2016, 2:22 PM

## 2016-01-15 LAB — URINE CULTURE

## 2016-01-15 LAB — GLUCOSE, CAPILLARY: Glucose-Capillary: 123 mg/dL — ABNORMAL HIGH (ref 65–99)

## 2016-01-15 MED ORDER — HYDROCOD POLST-CPM POLST ER 10-8 MG/5ML PO SUER: 5.0000 mL | Freq: Two times a day (BID) | ORAL | Status: DC | PRN

## 2016-01-15 MED ORDER — LEVOFLOXACIN 750 MG PO TABS: 750.0000 mg | ORAL_TABLET | ORAL | Status: DC

## 2016-01-15 MED ORDER — DARIFENACIN HYDROBROMIDE ER 15 MG PO TB24: 15.0000 mg | ORAL_TABLET | Freq: Every day | ORAL | Status: DC

## 2016-01-15 NOTE — Progress Notes (Signed)
A & O. Room air. NSR. Pt reports no pain. IV and tele removed. Discharge instructions reviewed. Prescriptions given to pt. Husband to take home. Pt has no further concerns at this time.

## 2016-01-15 NOTE — Discharge Summary (Signed)
[facility] Physicians - [place] at [facility]   PATIENT NAME: [name]    MR#:  [id number]  DATE OF BIRTH:  [birth year]  DATE OF ADMISSION:  01/12/2016 ADMITTING PHYSICIAN: Gale Journey, MD  DATE OF DISCHARGE: 01/15/2016  PRIMARY CARE PHYSICIAN: Pcp Not In System    ADMISSION DIAGNOSIS:  Shortness of breath [R06.02] Lactic acidosis [E87.2] CAP (community acquired pneumonia) [J18.9]  DISCHARGE DIAGNOSIS:  Active Problems:   Sepsis (HCC)    UTI- E coli and Klebsiella- Sensitive to levaquin. SECONDARY DIAGNOSIS:   Past Medical History  Diagnosis Date  . Cerebral palsy (HCC)   . Diabetes mellitus without complication (HCC)   . H/O recurrent urinary tract infection   . Psoriasis   . H/O sickle cell trait   . E. coli UTI (urinary tract infection)   . BV (bacterial vaginosis)   . Spina bifida (HCC)   . Myasthenia gravis (HCC)   . GERD (gastroesophageal reflux disease)   . Yeast infection   . Hypokalemia   . Heavy periods   . Trichimoniasis   . Hypertension   . Vaginal discharge   . DUB (dysfunctional uterine bleeding)     HOSPITAL COURSE:   1. Sepsis: Meeting criteria with hypotension, tachycardia, elevated lactic acid, 2/2 urinary tract infection and PNA  - has received 2 L of normal saline. Continue hydration. Continue to monitor on telemetry. Blood cultures ntd  2. Pneumonia: CT scan showing mild patchy opacification of the lingula representing possible pneumonia. Continue Levaquin, changed to by mouth  Continue antitussives  3. Urinary tract infection: Patient states she has chronic recurrent urinary tract infections and is seen at [facility] urology for neurogenic bladder.  UA supports current urinary tract infection. Urine cultures with >100,000 colonies Escherichia coli sensitive to levofloxacin and ciprofloxacin. Urine culture also reveals Klebsiella greater than 80,000 colonies, sensitive to levaquin and cipro.   continue  Levaquin for now.   I recommend to speak to PMD on her follow up after 10 days- to consider Chronic suppressive therapy.  4. hypertention: Hypotensive at presentation. Bp is better , resume antihypertensives   5. Neurogenic bladder, ch h/o spinabifida - Continue Vesicare. Continue every 3 hour self catheter  6. Diabetes mellitus type 2: hemoglobin A1c 8.6  sliding scale. Hold oral hypoglycemic agents for now  7. myasthenia gravis: no exacerbation at this time. Continue CellCept, Mestinon and prednisone . She is tapering prednisone and would like to come off of this medication. OP f/u with [facility] neurology  8. Prophylaxis: Heparin for DVT prophylaxis. Continue Zantac.  DISCHARGE CONDITIONS:   Stable.  CONSULTS OBTAINED:     DRUG ALLERGIES:   Allergies  Allergen Reactions  . Contrast Media [Iodinated Diagnostic Agents]   . Latex Other (See Comments)  . Penicillins Rash    DISCHARGE MEDICATIONS:   Current Discharge Medication List    START taking these medications   Details  chlorpheniramine-HYDROcodone (TUSSIONEX) 10-8 MG/5ML SUER Take 5 mLs by mouth every 12 (twelve) hours as needed for cough. Qty: 140 mL, Refills: 0    darifenacin (ENABLEX) 15 MG 24 hr tablet Take 1 tablet (15 mg total) by mouth daily. Qty: 30 tablet, Refills: 0    levofloxacin (LEVAQUIN) 750 MG tablet Take 1 tablet (750 mg total) by mouth daily. Qty: 5 tablet, Refills: 0      CONTINUE these medications which have NOT CHANGED   Details  chlorthalidone (HYGROTON) 25 MG tablet Take 25 mg by mouth daily.  Cholecalciferol (VITAMIN D3) 50000 units CAPS Take 1 capsule by mouth once a week. Refills: 1    FLUOCINOLONE ACETONIDE BODY 0.01 % OIL Apply 1 application topically daily as needed.    glyBURIDE (DIABETA) 5 MG tablet Take 5 mg by mouth 2 (two) times daily with a meal.    ketoconazole (NIZORAL) 2 % cream Apply daily to flaky areas on face and under right armpit    lisinopril  (PRINIVIL,ZESTRIL) 5 MG tablet Take 5 mg by mouth daily.    loratadine (CLARITIN) 10 MG tablet Take 10 mg by mouth daily.    metFORMIN (GLUCOPHAGE) 500 MG tablet Take 500 mg by mouth 2 (two) times daily with a meal.    mycophenolate (CELLCEPT) 500 MG tablet Take 1,500 mg by mouth 2 (two) times daily.    norethindrone-ethinyl estradiol (JUNEL FE 1/20) 1-20 MG-MCG tablet Take 1 tablet by mouth daily. Qty: 3 Package, Refills: 4    potassium chloride SA (K-DUR,KLOR-CON) 20 MEQ tablet Take 20 mEq by mouth daily.    predniSONE (DELTASONE) 10 MG tablet Take 5 mg by mouth daily with breakfast.     pyridostigmine (MESTINON) 60 MG tablet Take 60 mg by mouth every 4 (four) hours.    ranitidine (ZANTAC) 150 MG tablet Take 150 mg by mouth 2 (two) times daily.      STOP taking these medications     solifenacin (VESICARE) 10 MG tablet          DISCHARGE INSTRUCTIONS:    Follow with PMD in 1-2 weeks.  If you experience worsening of your admission symptoms, develop shortness of breath, life threatening emergency, suicidal or homicidal thoughts you must seek medical attention immediately by calling 911 or calling your MD immediately  if symptoms less severe.  You Must read complete instructions/literature along with all the possible adverse reactions/side effects for all the Medicines you take and that have been prescribed to you. Take any new Medicines after you have completely understood and accept all the possible adverse reactions/side effects.   Please note  You were cared for by a hospitalist during your hospital stay. If you have any questions about your discharge medications or the care you received while you were in the hospital after you are discharged, you can call the unit and asked to speak with the hospitalist on call if the hospitalist that took care of you is not available. Once you are discharged, your primary care physician will handle any further medical issues. Please note  that NO REFILLS for any discharge medications will be authorized once you are discharged, as it is imperative that you return to your primary care physician (or establish a relationship with a primary care physician if you do not have one) for your aftercare needs so that they can reassess your need for medications and monitor your lab values.    Today   CHIEF COMPLAINT:   Chief Complaint  Patient presents with  . Cough    HISTORY OF PRESENT ILLNESS:  Tiawanna Luchsinger  is a 30 y.o. female with a known history of spina bifida, myasthenia gravis, neurogenic bladder requiring self catheterization every 3 hours as well as anti-spasm medication, recurrent urinary tract infections with about 3 weeks of malaise, cough productive of white sputum. She coughs constantly, can't even sleep. She did present to the emergency room 3 weeks ago and was found to have a urinary tract infection treated with Macrobid. There were no cultures collected at that time. She has developed subjective  fevers and shortness of breath over the past few days. On emergency room evaluation he is found to be septic with hypotension, tachycardia, elevated lactate. UA is positive for urinary tract infection and CT of the chest shows left lingular pneumonia.  VITAL SIGNS:  Blood pressure 124/87, pulse 80, temperature 97.9 F (36.6 C), temperature source Oral, resp. rate 18, height 5' (1.524 m), weight 91.808 kg (202 lb 6.4 oz), last menstrual period 12/25/2015, SpO2 99 %.  I/O:   Intake/Output Summary (Last 24 hours) at 01/15/16 1159 Last data filed at 01/15/16 1001  Gross per 24 hour  Intake    600 ml  Output    700 ml  Net   -100 ml    PHYSICAL EXAMINATION:   GENERAL: 30 y.o.-year-old patient lying in the bed with no acute distress.  EYES: Pupils equal, round, reactive to light and accommodation. No scleral icterus. Extraocular muscles intact.  HEENT: Head atraumatic, normocephalic. Oropharynx and nasopharynx clear.   NECK: Supple, no jugular venous distention. No thyroid enlargement, no tenderness.  LUNGS: Coarse breath sounds bilaterally, no wheezing, rales,rhonchi or crepitation. No use of accessory muscles of respiration.  CARDIOVASCULAR: S1, S2 normal. No murmurs, rubs, or gallops.  ABDOMEN: Soft, nontender, nondistended. Bowel sounds present. No organomegaly or mass.  EXTREMITIES: No pedal edema, cyanosis, or clubbing.  NEUROLOGIC: Cranial nerves II through XII are intact. Muscle strength 5/5 in all extremities. Sensation intact. Gait not checked.  PSYCHIATRIC: The patient is alert and oriented x 3.  SKIN: No obvious rash, lesion, or ulcer.   DATA REVIEW:   CBC  Recent Labs Lab 01/14/16 0442  WBC 14.8*  HGB 12.1  HCT 36.7  PLT 272    Chemistries   Recent Labs Lab 01/12/16 1002  01/14/16 0442  NA 136  < > 139  K 3.5  < > 3.5  CL 96*  < > 100*  CO2 27  < > 29  GLUCOSE 172*  < > 150*  BUN 10  < > 13  CREATININE 0.68  < > 0.63  CALCIUM 9.2  < > 8.9  MG  --   --  2.0  AST 19  --   --   ALT 9*  --   --   ALKPHOS 36*  --   --   BILITOT 0.3  --   --   < > = values in this interval not displayed.  Cardiac Enzymes No results for input(s): TROPONINI in the last 168 hours.  Microbiology Results  Results for orders placed or performed during the hospital encounter of 01/12/16  Blood Culture (routine x 2)     Status: None (Preliminary result)   Collection Time: 01/12/16 10:02 AM  Result Value Ref Range Status   Specimen Description BLOOD RIGHT AC  Final   Special Requests BOTTLES DRAWN AEROBIC AND ANAEROBIC  Final   Culture NO GROWTH 3 DAYS  Final   Report Status PENDING  Incomplete  Urine culture     Status: None   Collection Time: 01/12/16 10:30 AM  Result Value Ref Range Status   Specimen Description URINE, RANDOM  Final   Special Requests NONE  Final   Culture   Final    >=100,000 COLONIES/mL ESCHERICHIA COLI 80,000 COLONIES/ml KLEBSIELLA PNEUMONIAE     Report Status 01/15/2016 FINAL  Final   Organism ID, Bacteria ESCHERICHIA COLI  Final   Organism ID, Bacteria KLEBSIELLA PNEUMONIAE  Final      Susceptibility   Escherichia coli -  MIC*    AMPICILLIN >=32 RESISTANT Resistant     CEFTAZIDIME <=1 SENSITIVE Sensitive     CEFAZOLIN <=4 SENSITIVE Sensitive     CEFTRIAXONE <=1 SENSITIVE Sensitive     CIPROFLOXACIN 0.5 SENSITIVE Sensitive     GENTAMICIN <=1 SENSITIVE Sensitive     IMIPENEM <=0.25 SENSITIVE Sensitive     TRIMETH/SULFA <=20 SENSITIVE Sensitive     NITROFURANTOIN Value in next row Sensitive      SENSITIVE<=16    PIP/TAZO Value in next row Sensitive      SENSITIVE<=4    * >=100,000 COLONIES/mL ESCHERICHIA COLI   Klebsiella pneumoniae - MIC*    AMPICILLIN Value in next row Resistant      SENSITIVE<=4    CEFTAZIDIME Value in next row Sensitive      SENSITIVE<=4    CEFAZOLIN Value in next row Sensitive      SENSITIVE<=4    CEFTRIAXONE Value in next row Sensitive      SENSITIVE<=4    CIPROFLOXACIN Value in next row Sensitive      SENSITIVE<=4    GENTAMICIN Value in next row Sensitive      SENSITIVE<=4    IMIPENEM Value in next row Sensitive      SENSITIVE<=4    TRIMETH/SULFA Value in next row Sensitive      SENSITIVE<=4    NITROFURANTOIN Value in next row Intermediate      INTERMEDIATE64    PIP/TAZO Value in next row Sensitive      SENSITIVE<=4    * 80,000 COLONIES/ml KLEBSIELLA PNEUMONIAE  Blood Culture (routine x 2)     Status: None (Preliminary result)   Collection Time: 01/12/16 11:14 AM  Result Value Ref Range Status   Specimen Description BLOOD RIGHT AC  Final   Special Requests   Final    BOTTLES DRAWN AEROBIC AND ANAEROBIC  ANA 3ML,AER   Culture NO GROWTH 3 DAYS  Final   Report Status PENDING  Incomplete    RADIOLOGY:  No results found.  EKG:   Orders placed or performed during the hospital encounter of 01/12/16  . ED EKG  . ED EKG      Management plans discussed with the patient, family and  they are in agreement.  CODE STATUS:     Code Status Orders        Start     Ordered   01/12/16 1734  Full code   Continuous     01/12/16 1733    Code Status History    Date Active Date Inactive Code Status Order ID Comments User Context   This patient has a current code status but no historical code status.      TOTAL TIME TAKING CARE OF THIS PATIENT: 35 minutes.    Altamese Dilling M.D on 01/15/2016 at 11:59 AM  Between 7am to 6pm - Pager - [phone]  After 6pm go to [email or web address] - password [email or web address]  [facility]  Office  [phone]  CC: Primary care physician; Pcp Not In System   Note: This dictation was prepared with Dragon dictation along with smaller phrase technology. Any transcriptional errors that result from this process are unintentional.

## 2016-01-17 LAB — CULTURE, BLOOD (ROUTINE X 2)
CULTURE: NO GROWTH
Culture: NO GROWTH

## 2016-01-27 ENCOUNTER — Emergency Department: Payer: Medicaid Other

## 2016-01-27 ENCOUNTER — Emergency Department
Admission: EM | Admit: 2016-01-27 | Discharge: 2016-01-27 | Disposition: A | Discharge status: Home / Self Care | Payer: Medicaid Other | Attending: Emergency Medicine | Admitting: Emergency Medicine

## 2016-01-27 ENCOUNTER — Encounter: Payer: Self-pay | Admitting: Emergency Medicine

## 2016-01-27 DIAGNOSIS — K8050 Calculus of bile duct without cholangitis or cholecystitis without obstruction: Secondary | ICD-10-CM

## 2016-01-27 DIAGNOSIS — Z91040 Latex allergy status: Secondary | ICD-10-CM | POA: Insufficient documentation

## 2016-01-27 DIAGNOSIS — R1011 Right upper quadrant pain: Secondary | ICD-10-CM | POA: Diagnosis present

## 2016-01-27 DIAGNOSIS — K8020 Calculus of gallbladder without cholecystitis without obstruction: Secondary | ICD-10-CM | POA: Diagnosis not present

## 2016-01-27 DIAGNOSIS — Z7952 Long term (current) use of systemic steroids: Secondary | ICD-10-CM | POA: Insufficient documentation

## 2016-01-27 DIAGNOSIS — Z7984 Long term (current) use of oral hypoglycemic drugs: Secondary | ICD-10-CM | POA: Insufficient documentation

## 2016-01-27 DIAGNOSIS — E119 Type 2 diabetes mellitus without complications: Secondary | ICD-10-CM | POA: Insufficient documentation

## 2016-01-27 DIAGNOSIS — Z79899 Other long term (current) drug therapy: Secondary | ICD-10-CM | POA: Diagnosis not present

## 2016-01-27 DIAGNOSIS — Z880 Allergy status to penicillin: Secondary | ICD-10-CM | POA: Diagnosis not present

## 2016-01-27 DIAGNOSIS — Z792 Long term (current) use of antibiotics: Secondary | ICD-10-CM | POA: Diagnosis not present

## 2016-01-27 DIAGNOSIS — I10 Essential (primary) hypertension: Secondary | ICD-10-CM | POA: Diagnosis not present

## 2016-01-27 LAB — COMPREHENSIVE METABOLIC PANEL
ALBUMIN: 3.8 g/dL (ref 3.5–5.0)
ALK PHOS: 37 U/L — AB (ref 38–126)
ALT: 14 U/L (ref 14–54)
ANION GAP: 11 (ref 5–15)
AST: 16 U/L (ref 15–41)
BILIRUBIN TOTAL: 0.4 mg/dL (ref 0.3–1.2)
BUN: 9 mg/dL (ref 6–20)
CALCIUM: 9.3 mg/dL (ref 8.9–10.3)
CO2: 29 mmol/L (ref 22–32)
CREATININE: 0.59 mg/dL (ref 0.44–1.00)
Chloride: 93 mmol/L — ABNORMAL LOW (ref 101–111)
GFR calc non Af Amer: >60 mL/min (ref >60)
GLUCOSE: 154 mg/dL — AB (ref 65–99)
Potassium: 2.8 mmol/L — CL (ref 3.5–5.1)
Sodium: 133 mmol/L — ABNORMAL LOW (ref 135–145)
TOTAL PROTEIN: 7.2 g/dL (ref 6.5–8.1)

## 2016-01-27 LAB — CBC
HEMATOCRIT: 36.9 % (ref 35.0–47.0)
HEMOGLOBIN: 12.4 g/dL (ref 12.0–16.0)
MCH: 25.9 pg — AB (ref 26.0–34.0)
MCHC: 33.7 g/dL (ref 32.0–36.0)
MCV: 76.8 fL — ABNORMAL LOW (ref 80.0–100.0)
Platelets: 311 10*3/uL (ref 150–440)
RBC: 4.8 MIL/uL (ref 3.80–5.20)
RDW: 13.3 % (ref 11.5–14.5)
WBC: 13.5 10*3/uL — ABNORMAL HIGH (ref 3.6–11.0)

## 2016-01-27 LAB — TROPONIN I: Troponin I: <0.03 ng/mL (ref <0.031)

## 2016-01-27 LAB — LIPASE, BLOOD: Lipase: 20 U/L (ref 11–51)

## 2016-01-27 MED ORDER — SODIUM CHLORIDE 0.9 % IV BOLUS (SEPSIS)
1000.0000 mL | Freq: Once | INTRAVENOUS | Status: AC
  Administered 2016-01-27: 1000 mL via INTRAVENOUS
  Filled 2016-01-27: qty 1000

## 2016-01-27 MED ORDER — TRAMADOL HCL 50 MG PO TABS: 50.0000 mg | ORAL_TABLET | Freq: Four times a day (QID) | ORAL | Status: DC | PRN

## 2016-01-27 MED ORDER — KETOROLAC TROMETHAMINE 30 MG/ML IJ SOLN
30.0000 mg | Freq: Once | INTRAMUSCULAR | Status: AC
  Administered 2016-01-27: 30 mg via INTRAVENOUS
  Filled 2016-01-27: qty 1

## 2016-01-27 MED ORDER — ONDANSETRON HCL 4 MG/2ML IJ SOLN
4.0000 mg | Freq: Once | INTRAMUSCULAR | Status: AC
  Administered 2016-01-27: 4 mg via INTRAVENOUS
  Filled 2016-01-27: qty 2

## 2016-01-27 NOTE — Discharge Instructions (Signed)
You have been seen in the emergency department today for abdominal pain. Your workup shows gallstones but no signs for gallbladder infection. Your pain could very likely be due to your gallbladder. Please take your pain medication as needed, as prescribed. Please avoid fatty or oily foods. Please call the number provided for surgery to arrange a follow-up appointment for further evaluation. Return to the emergency department for any worsening pain, fever, or any other symptom personally concerning to your self.  Cholelithiasis Cholelithiasis (also called gallstones) is a form of gallbladder disease. The gallbladder is a small organ that helps you digest fats. Symptoms of gallstones are:  Feeling sick to your stomach (nausea).  Throwing up (vomiting).  Belly pain.  Yellowing of the skin (jaundice).  Sudden pain. You may feel the pain for minutes to hours.  Fever.  Pain to the touch. HOME CARE  Only take medicines as told by your doctor.  Eat a low-fat diet until you see your doctor again. Eating fat can result in pain.  Follow up with your doctor as told. Attacks usually happen time after time. Surgery is usually needed for permanent treatment. GET HELP RIGHT AWAY IF:   Your pain gets worse.  Your pain is not helped by medicines.  You have a fever and lasting symptoms for more than 2-3 days.  You have a fever and your symptoms suddenly get worse.  You keep feeling sick to your stomach and throwing up. MAKE SURE YOU:   Understand these instructions.  Will watch your condition.  Will get help right away if you are not doing well or get worse.   This information is not intended to replace advice given to you by your health care provider. Make sure you discuss any questions you have with your health care provider.   Document Released: 05/29/2008 Document Revised: 08/13/2013 Document Reviewed: 06/04/2013 Elsevier Interactive Patient Education [employer]

## 2016-01-27 NOTE — ED Provider Notes (Signed)
[facility] Emergency Department Provider Note  Time seen: 8:05 AM  I have reviewed the triage vital signs and the nursing notes.   HISTORY  Chief Complaint Abdominal Pain    HPI [name] is a 30 y.o. female with a past medical history of cerebral palsy, diabetes, myasthenia, who presents to the emergency department with right upper quadrant abdominal pain. According to the patient 2 days ago after eating dinner she developed right upper quadrant abdominal discomfort, lasted several hours and went away. Last night she ate half of a salad, and approximately one hour after eating developed right upper quadrant abdominal pain again. Patient states the abdominal pain has continued currently an 8/10 per patient. States some nausea but denies vomiting. Patient denies fever, diarrhea, dysuria.Describes the pain as a dull aching pain in the right upper quadrant.     Past Medical History  Diagnosis Date  . Cerebral palsy (HCC)   . Diabetes mellitus without complication (HCC)   . H/O recurrent urinary tract infection   . Psoriasis   . H/O sickle cell trait   . E. coli UTI (urinary tract infection)   . BV (bacterial vaginosis)   . Spina bifida (HCC)   . Myasthenia gravis (HCC)   . GERD (gastroesophageal reflux disease)   . Yeast infection   . Hypokalemia   . Heavy periods   . Trichimoniasis   . Hypertension   . Vaginal discharge   . DUB (dysfunctional uterine bleeding)     Patient Active Problem List   Diagnosis Date Noted  . Sepsis (HCC) 01/12/2016  . Absolute anemia 08/17/2015  . Hay fever 05/17/2015  . Can't get food down 05/17/2015  . Secondary diabetes mellitus (HCC) 03/15/2015  . Other long term (current) drug therapy 04/20/2014  . Long term current use of systemic steroids 04/20/2014  . Clinical depression 01/19/2014  . Adenopathy 01/19/2014  . CN (constipation) 08/12/2013  . Spina bifida (HCC) 08/12/2013  . Esophagitis, reflux  10/02/2011  . Avitaminosis D 10/02/2011  . Essential (primary) hypertension 05/15/2011  . Myasthenia gravis (HCC) 12/06/2010    Past Surgical History  Procedure Laterality Date  . Hysteroscopy w/d&c  2014    benign endometrial polyps  . Adenoidectomy    . Back surgery    . Foot surgery    . Tonsillectomy      Current Outpatient Rx  Name  Route  Sig  Dispense  Refill  . chlorpheniramine-HYDROcodone (TUSSIONEX) 10-8 MG/5ML SUER   Oral   Take 5 mLs by mouth every 12 (twelve) hours as needed for cough.   140 mL   0   . chlorthalidone (HYGROTON) 25 MG tablet   Oral   Take 25 mg by mouth daily.         . Cholecalciferol (VITAMIN D3) 50000 units CAPS   Oral   Take 1 capsule by mouth once a week.      1   . darifenacin (ENABLEX) 15 MG 24 hr tablet   Oral   Take 1 tablet (15 mg total) by mouth daily.   30 tablet   0   . FLUOCINOLONE ACETONIDE BODY 0.01 % OIL   Topical   Apply 1 application topically daily as needed.         . glyBURIDE (DIABETA) 5 MG tablet   Oral   Take 5 mg by mouth 2 (two) times daily with a meal.         . ketoconazole (NIZORAL) 2 %  cream      Apply daily to flaky areas on face and under right armpit         . levofloxacin (LEVAQUIN) 750 MG tablet   Oral   Take 1 tablet (750 mg total) by mouth daily.   5 tablet   0   . lisinopril (PRINIVIL,ZESTRIL) 5 MG tablet   Oral   Take 5 mg by mouth daily.         Marland Kitchen loratadine (CLARITIN) 10 MG tablet   Oral   Take 10 mg by mouth daily.         . metFORMIN (GLUCOPHAGE) 500 MG tablet   Oral   Take 500 mg by mouth 2 (two) times daily with a meal.         . mycophenolate (CELLCEPT) 500 MG tablet   Oral   Take 1,500 mg by mouth 2 (two) times daily.         . norethindrone-ethinyl estradiol (JUNEL FE 1/20) 1-20 MG-MCG tablet   Oral   Take 1 tablet by mouth daily.   3 Package   4   . potassium chloride SA (K-DUR,KLOR-CON) 20 MEQ tablet   Oral   Take 20 mEq by mouth daily.          . predniSONE (DELTASONE) 10 MG tablet   Oral   Take 5 mg by mouth daily with breakfast.          . pyridostigmine (MESTINON) 60 MG tablet   Oral   Take 60 mg by mouth every 4 (four) hours.         . ranitidine (ZANTAC) 150 MG tablet   Oral   Take 150 mg by mouth 2 (two) times daily.           Allergies Contrast media; Latex; and Penicillins  Family History  Problem Relation Age of Onset  . Family history unknown: Yes    Social History Social History  Substance Use Topics  . Smoking status: Never Smoker   . Smokeless tobacco: None  . Alcohol Use: No    Review of Systems Constitutional: Negative for fever. Cardiovascular: Negative for chest pain. Respiratory: Negative for shortness of breath. Gastrointestinal: Right upper quadrant abdominal pain. Positive nausea. Negative vomiting or diarrhea. Genitourinary: Negative for dysuria. Neurological: Negative for headache 10-point ROS otherwise negative.  ____________________________________________   PHYSICAL EXAM:  VITAL SIGNS: ED Triage Vitals  Enc Vitals Group     BP 01/27/16 0747 140/93 mmHg     Pulse Rate 01/27/16 0747 90     Resp 01/27/16 0747 18     Temp 01/27/16 0747 98.2 F (36.8 C)     Temp Source 01/27/16 0747 Oral     SpO2 01/27/16 0747 99 %     Weight 01/27/16 0747 200 lb (90.719 kg)     Height 01/27/16 0747 5' (1.524 m)     Head Cir --      Peak Flow --      Pain Score 01/27/16 0750 8     Pain Loc --      Pain Edu? --      Excl. in GC? --     Constitutional: Alert and oriented. Well appearing and in no distress. Eyes: Normal exam ENT   Head: Normocephalic and atraumatic.   Mouth/Throat: Mucous membranes are moist. Cardiovascular: Normal rate, regular rhythm. No murmur Respiratory: Normal respiratory effort without tachypnea nor retractions. Breath sounds are clear  Gastrointestinal: Soft, mild right upper quadrant tenderness palpation.  No rebound or guarding. No  distention. Musculoskeletal: Nontender with normal range of motion in all extremities. Neurologic:  Normal speech and language. No gross focal neurologic deficits  Skin:  Skin is warm, dry and intact.  Psychiatric: Mood and affect are normal. Speech and behavior are normal.  ____________________________________________    EKG  EKG reviewed and interpreted by myself shows normal sinus rhythm 82 bpm, narrow QRS, left axis deviation, normal intervals, nonspecific but no concerning ST changes.  ____________________________________________    RADIOLOGY  Ultrasound shows gallstones without signs of cholecystitis.  ____________________________________________    INITIAL IMPRESSION / ASSESSMENT AND PLAN / ED COURSE  Pertinent labs & imaging results that were available during my care of the patient were reviewed by me and considered in my medical decision making (see chart for details).  The patient presents to the emergency Department provider quadrant abdominal pain since last night. Patient has a good story for biliary disease. We'll check labs, treat pain and nausea, and obtain an ultrasound to further evaluate the patient's gallbladder. Currently the patient appears overall very well, minimal tenderness on exam.  Ultrasound shows gallstones. Labs are largely at the patient's baseline/within normal limits. Given the patient's history of pain after eating, located in the right upper quadrant clinical picture most consistent with biliary colic. We will discharge the patient on pain medication as needed, and have her follow-up with surgery. Patient agreeable to plan.  ____________________________________________   FINAL CLINICAL IMPRESSION(S) / ED DIAGNOSES  Right upper quadrant abdominal pain Biliary colic  Minna Antis, MD 01/27/16 1013

## 2016-01-27 NOTE — ED Notes (Signed)
Pt discharged home after verbalizing understanding of discharge instructions; nad noted. 

## 2016-01-27 NOTE — ED Notes (Signed)
Pt here with c/o RUQ pain that began 3 days ago, describes it as "stabbing" pain. States eating and drinking make the pain worse, does have her gallbladder.

## 2016-01-27 NOTE — ED Notes (Signed)
Pt reports pain in RUQ x 3 days; intermittent for first 2 days, has been constant since last night. Pt denies n/v/d/fever. Pt alert & oriented. NAD noted

## 2016-01-28 ENCOUNTER — Telehealth: Payer: Self-pay | Admitting: Surgery

## 2016-01-28 ENCOUNTER — Encounter: Payer: Self-pay | Admitting: Surgery

## 2016-01-28 ENCOUNTER — Other Ambulatory Visit: Payer: Medicaid Other

## 2016-01-28 ENCOUNTER — Ambulatory Visit (INDEPENDENT_AMBULATORY_CARE_PROVIDER_SITE_OTHER): Payer: Medicaid Other | Admitting: Surgery

## 2016-01-28 VITALS — BP 133/89 | HR 93 | Temp 98.3°F | Ht 60.0 in | Wt 207.0 lb

## 2016-01-28 DIAGNOSIS — K8020 Calculus of gallbladder without cholecystitis without obstruction: Secondary | ICD-10-CM | POA: Diagnosis not present

## 2016-01-28 NOTE — Progress Notes (Signed)
Patient ID: [name], female   DOB: [birth year], 30 y.o.   MRN: [id number]  History of Present Illness [name] is a 30 y.o. female with a history of right upper quadrant pain for the last 3 days. Patient reports the pain is sharp started after heavy meal and now has remained constant. Pain is nonradiating and is severe in nature It's get worse when she lays on the right side. She went to the emergency room and workup performed including an ultrasound showing evidence of cholelithiasis no cholecystitis. Call bile duct mildly dilated, 7 mm. Normal LFTs and normal alkaline phosphatase. There is no evidence of obstructive jaundice. She does have a history of myasthenia gravis and spina  Bifida. She is able to walk with some inserts and devices  Past Medical History Past Medical History  Diagnosis Date  . Cerebral palsy (HCC)   . Diabetes mellitus without complication (HCC)   . H/O recurrent urinary tract infection   . Psoriasis   . H/O sickle cell trait   . E. coli UTI (urinary tract infection)   . BV (bacterial vaginosis)   . Spina bifida (HCC)   . Myasthenia gravis (HCC)   . GERD (gastroesophageal reflux disease)   . Yeast infection   . Hypokalemia   . Heavy periods   . Trichimoniasis   . Hypertension   . Vaginal discharge   . DUB (dysfunctional uterine bleeding)      Past Surgical History  Procedure Laterality Date  . Hysteroscopy w/d&c  2014    benign endometrial polyps  . Adenoidectomy    . Back surgery    . Foot surgery    . Tonsillectomy    . Eye surgery      Allergies  Allergen Reactions  . Contrast Media [Iodinated Diagnostic Agents]   . Latex Other (See Comments)  . Penicillins Rash    Current Outpatient Prescriptions  Medication Sig Dispense Refill  . chlorpheniramine-HYDROcodone (TUSSIONEX) 10-8 MG/5ML SUER Take 5 mLs by mouth every 12 (twelve) hours as needed for cough. 140 mL 0  . chlorthalidone (HYGROTON) 25 MG tablet Take 25 mg by mouth daily.     . Cholecalciferol (VITAMIN D3) 50000 units CAPS Take 1 capsule by mouth once a week.  1  . darifenacin (ENABLEX) 15 MG 24 hr tablet Take 1 tablet (15 mg total) by mouth daily. 30 tablet 0  . FLUOCINOLONE ACETONIDE BODY 0.01 % OIL Apply 1 application topically daily as needed.    . glyBURIDE (DIABETA) 5 MG tablet Take 5 mg by mouth 2 (two) times daily with a meal.    . ketoconazole (NIZORAL) 2 % cream Apply daily to flaky areas on face and under right armpit prn    . levofloxacin (LEVAQUIN) 750 MG tablet Take 1 tablet (750 mg total) by mouth daily. 5 tablet 0  . lisinopril (PRINIVIL,ZESTRIL) 5 MG tablet Take 5 mg by mouth daily.    Marland Kitchen loratadine (CLARITIN) 10 MG tablet Take 10 mg by mouth daily.    . metFORMIN (GLUCOPHAGE) 500 MG tablet Take 500 mg by mouth 2 (two) times daily with a meal.    . mycophenolate (CELLCEPT) 500 MG tablet Take 1,500 mg by mouth 2 (two) times daily.    . norethindrone-ethinyl estradiol (JUNEL FE 1/20) 1-20 MG-MCG tablet Take 1 tablet by mouth daily. 3 Package 4  . potassium chloride SA (K-DUR,KLOR-CON) 20 MEQ tablet Take 20 mEq by mouth daily.    . predniSONE (DELTASONE) 10 MG tablet Take  12.5 mg by mouth daily with breakfast.     . pyridostigmine (MESTINON) 60 MG tablet Take 60 mg by mouth every 4 (four) hours.    . ranitidine (ZANTAC) 150 MG tablet Take 150 mg by mouth 2 (two) times daily.    . traMADol (ULTRAM) 50 MG tablet Take 1 tablet (50 mg total) by mouth every 6 (six) hours as needed. 20 tablet 0  . VESICARE 10 MG tablet Take 1 tablet by mouth daily.  0   No current facility-administered medications for this visit.    Family History Family History  Problem Relation Age of Onset  . Adopted: Yes  . Family history unknown: Yes     Social History Social History  Substance Use Topics  . Smoking status: Never Smoker   . Smokeless tobacco: Never Used  . Alcohol Use: No     ROS 10 point review of system was performed and is otherwise  negative  Physical Exam Blood pressure 133/89, pulse 93, temperature 98.3 F (36.8 C), temperature source Oral, height 5' (1.524 m), weight 93.895 kg (207 lb), last menstrual period 01/20/2016.  CONSTITUTIONAL: He is in no acute distress awake alert she is obese EYES: Pupils equal, round, and reactive to light, Sclera non-icteric. EARS, NOSE, MOUTH AND THROAT: The oropharynx is clear. Oral mucosa is pink and moist. Hearing is intact to voice.  NECK: Trachea is midline, and there is no jugular venous distension. Thyroid is without palpable abnormalities. LYMPH NODES:  Lymph nodes in the neck are not enlarged. RESPIRATORY:  Lungs are clear, and breath sounds are equal bilaterally. Normal respiratory effort without pathologic use of accessory muscles. CARDIOVASCULAR: Heart is regular without murmurs, gallops, or rubs. GI: The abdomen is soft, tender to palpation in the right upper quadrant and also tender in the costal margin. No peritonitis and no evidence of Murphy sign.  There were no palpable masses. There was no hepatosplenomegaly.  MUSCULOSKELETAL:  Normal muscle strength and tone in all four extremities.    SKIN: Skin turgor is normal. There are no pathologic skin lesions.  NEUROLOGIC:  Motor and sensation is grossly normal.  Cranial nerves are grossly intact. PSYCH:  Alert and oriented to person, place and time. Affect is normal.  Data Reviewed U/s CBD 7 mm  I have personally reviewed the patient's imaging and medical records.    Assessment/Plan Symptomatic cholelithiasis. Discussed with the patient in detail about my recommendation for a laparoscopic cholecystectomy possible open with intraoperative cholangiogram. We also discussed possibility that there might be  a component of intercostal nerve neuralgia that Will not go away after the cholecystectomy. The risks, benefits, complications, treatment options, and expected outcomes were discussed with the patient. The possibilities of  bleeding, recurrent infection, finding a normal gallbladder, perforation of viscus organs, damage to surrounding structures, bile leak, abscess formation, needing a drain placed, the need for additional procedures, reaction to medication, pulmonary aspiration,  failure to diagnose a condition, the possible need to convert to an open procedure, and creating a complication requiring transfusion or operation were discussed with the patient. The patient and/or family concurred with the proposed plan, giving informed consent.  Face-to-face time spent with the patient and care providers was 45 minutes, with more than 50% of the time spent counseling, educating, and coordinating care of the patient.    Sterling Big, MD FACS  [name] F [name] 01/28/2016, 1:28 PM

## 2016-01-28 NOTE — Telephone Encounter (Signed)
Pt advised of pre op date/time and sx date. Sx: 02/02/16 with Dr Pabon--Laparoscopic cholecystectomy.  Pre op: 01/31/16 @ 1:30pm--office.

## 2016-01-31 ENCOUNTER — Encounter
Admission: RE | Admit: 2016-01-31 | Discharge: 2016-01-31 | Disposition: A | Discharge status: Home / Self Care | Payer: Medicaid Other | Source: Ambulatory Visit | Attending: Surgery | Admitting: Surgery

## 2016-01-31 DIAGNOSIS — Z01812 Encounter for preprocedural laboratory examination: Secondary | ICD-10-CM | POA: Insufficient documentation

## 2016-01-31 HISTORY — DX: Nausea with vomiting, unspecified: R11.2

## 2016-01-31 HISTORY — DX: Pneumonia, unspecified organism: J18.9

## 2016-01-31 HISTORY — DX: Chronic kidney disease, unspecified: N18.9

## 2016-01-31 HISTORY — DX: Other specified postprocedural states: Z98.890

## 2016-01-31 LAB — DIFFERENTIAL
Basophils Absolute: 0.1 10*3/uL (ref 0–0.1)
Basophils Relative: 1 %
Eosinophils Absolute: 0.3 10*3/uL (ref 0–0.7)
Eosinophils Relative: 2 %
Lymphocytes Relative: 24 %
Lymphs Abs: 3.5 10*3/uL (ref 1.0–3.6)
Monocytes Absolute: 1.3 10*3/uL — ABNORMAL HIGH (ref 0.2–0.9)
Monocytes Relative: 9 %
Neutro Abs: 9.5 10*3/uL — ABNORMAL HIGH (ref 1.4–6.5)
Neutrophils Relative %: 64 %

## 2016-01-31 LAB — CBC
HCT: 39.4 % (ref 35.0–47.0)
Hemoglobin: 13 g/dL (ref 12.0–16.0)
MCH: 25.7 pg — AB (ref 26.0–34.0)
MCHC: 32.9 g/dL (ref 32.0–36.0)
MCV: 78.2 fL — ABNORMAL LOW (ref 80.0–100.0)
PLATELETS: 369 10*3/uL (ref 150–440)
RBC: 5.04 MIL/uL (ref 3.80–5.20)
RDW: 13.6 % (ref 11.5–14.5)
WBC: 14.7 10*3/uL — ABNORMAL HIGH (ref 3.6–11.0)

## 2016-01-31 LAB — BASIC METABOLIC PANEL
ANION GAP: 10 (ref 5–15)
BUN: 12 mg/dL (ref 6–20)
CALCIUM: 9.2 mg/dL (ref 8.9–10.3)
CO2: 28 mmol/L (ref 22–32)
CREATININE: 0.67 mg/dL (ref 0.44–1.00)
Chloride: 96 mmol/L — ABNORMAL LOW (ref 101–111)
Glucose, Bld: 151 mg/dL — ABNORMAL HIGH (ref 65–99)
Potassium: 2.9 mmol/L — CL (ref 3.5–5.1)
SODIUM: 134 mmol/L — AB (ref 135–145)

## 2016-01-31 NOTE — Pre-Procedure Instructions (Signed)
Dr. Mikey Bussing notified of patient medical history (myasthenia gravis, sickle cell carrier, diabetes, and recent pneumonia hospitalization, and EKG), ok given for surgery.

## 2016-01-31 NOTE — Patient Instructions (Signed)
  Your procedure is scheduled on: February 02, 2016 (Wednesday) Report to Day Surgery.[facility]) Second Floor To find out your arrival time please call [phone] between 1PM - 3PM on February 01, 2016 (Tuesday).  Remember: Instructions that are not followed completely may result in serious medical risk, up to and including death, or upon the discretion of your surgeon and anesthesiologist your surgery may need to be rescheduled.    __x__ 1. Do not eat food or drink liquids after midnight. No gum chewing or hard candies.     ____ 2. No Alcohol for 24 hours before or after surgery.   ____ 3. Bring all medications with you on the day of surgery if instructed.    __x__ 4. Notify your doctor if there is any change in your medical condition     (cold, fever, infections).     Do not wear jewelry, make-up, hairpins, clips or nail polish.  Do not wear lotions, powders, or perfumes. You may wear deodorant.  Do not shave 48 hours prior to surgery. Men may shave face and neck.  Do not bring valuables to the hospital.    [facility] is not responsible for any belongings or valuables.               Contacts, dentures or bridgework may not be worn into surgery.  Leave your suitcase in the car. After surgery it may be brought to your room.  For patients admitted to the hospital, discharge time is determined by your                treatment team.   Patients discharged the day of surgery will not be allowed to drive home.   Please read over the following fact sheets that you were given:   Surgical Site Infection Prevention   ____ Take these medicines the morning of surgery with A SIP OF WATER:    1. Mycophenolate  2. Mestinon  3. Lisinopril  4.Zantac  5.  6.  ____ Fleet Enema (as directed)   __x__ Use CHG Soap as directed  ____ Use inhalers on the day of surgery  __x__ Stop metformin 2 days prior to surgery (Stop Metformin today)    ____ Take 1/2 of usual insulin dose the night  before surgery and none on the morning of surgery.   __x_ Stop Coumadin/Plavix/aspirin on (NO ASPIRIN)  _x___ Stop Anti-inflammatories on (NO NSAIDS) Tylenol ok to take for pain if needed)   ____ Stop supplements until after surgery.    ____ Bring C-Pap to the hospital.

## 2016-02-01 ENCOUNTER — Telehealth: Payer: Self-pay

## 2016-02-01 NOTE — Pre-Procedure Instructions (Signed)
WBC and K+ results faxed to Dr. Everlene Farrier office.

## 2016-02-01 NOTE — Telephone Encounter (Addendum)
Received critical potassium results of 2.9. Spoke with Dr. Tonita Cong- he would like patient to take K-Dur BID. Patient currently takes daily so we will increase to 40 twice daily today and then once in am prior to going to hospital with a sip of water. Patient states that she does not need this called in as she has "extra" at home.  I explained this to the patient and also that we would recheck in am and if potassium still not within normal limits, the case would be rescheduled and she would take Potassium in the meantime. She was dissappointed to hear this but I wanted her to know about this possibility prior to arrival tomorrow.  Orders placed to recheck Potassium prior to surgery in am.  Dr. Everlene Farrier made aware at this time.

## 2016-02-02 ENCOUNTER — Encounter
Admission: RE | Disposition: A | Discharge status: Home / Self Care | Payer: Self-pay | Source: Ambulatory Visit | Attending: Surgery

## 2016-02-02 ENCOUNTER — Ambulatory Visit: Payer: Medicaid Other | Admitting: Anesthesiology

## 2016-02-02 ENCOUNTER — Encounter: Payer: Self-pay | Admitting: *Deleted

## 2016-02-02 ENCOUNTER — Ambulatory Visit: Payer: Medicaid Other

## 2016-02-02 ENCOUNTER — Observation Stay
Admission: RE | Admit: 2016-02-02 | Discharge: 2016-02-03 | Disposition: A | Discharge status: Home / Self Care | Payer: Medicaid Other | Source: Ambulatory Visit | Attending: Surgery | Admitting: Surgery

## 2016-02-02 DIAGNOSIS — Z91040 Latex allergy status: Secondary | ICD-10-CM | POA: Diagnosis not present

## 2016-02-02 DIAGNOSIS — D573 Sickle-cell trait: Secondary | ICD-10-CM | POA: Diagnosis not present

## 2016-02-02 DIAGNOSIS — Z9889 Other specified postprocedural states: Secondary | ICD-10-CM | POA: Diagnosis not present

## 2016-02-02 DIAGNOSIS — N189 Chronic kidney disease, unspecified: Secondary | ICD-10-CM | POA: Diagnosis not present

## 2016-02-02 DIAGNOSIS — Z79899 Other long term (current) drug therapy: Secondary | ICD-10-CM | POA: Insufficient documentation

## 2016-02-02 DIAGNOSIS — K819 Cholecystitis, unspecified: Secondary | ICD-10-CM | POA: Diagnosis present

## 2016-02-02 DIAGNOSIS — L409 Psoriasis, unspecified: Secondary | ICD-10-CM | POA: Diagnosis not present

## 2016-02-02 DIAGNOSIS — K8012 Calculus of gallbladder with acute and chronic cholecystitis without obstruction: Secondary | ICD-10-CM | POA: Diagnosis not present

## 2016-02-02 DIAGNOSIS — Z91041 Radiographic dye allergy status: Secondary | ICD-10-CM | POA: Insufficient documentation

## 2016-02-02 DIAGNOSIS — R1310 Dysphagia, unspecified: Secondary | ICD-10-CM | POA: Insufficient documentation

## 2016-02-02 DIAGNOSIS — G7000 Myasthenia gravis without (acute) exacerbation: Secondary | ICD-10-CM | POA: Diagnosis not present

## 2016-02-02 DIAGNOSIS — E876 Hypokalemia: Secondary | ICD-10-CM | POA: Diagnosis not present

## 2016-02-02 DIAGNOSIS — Z7984 Long term (current) use of oral hypoglycemic drugs: Secondary | ICD-10-CM | POA: Diagnosis not present

## 2016-02-02 DIAGNOSIS — Z793 Long term (current) use of hormonal contraceptives: Secondary | ICD-10-CM | POA: Insufficient documentation

## 2016-02-02 DIAGNOSIS — J029 Acute pharyngitis, unspecified: Secondary | ICD-10-CM | POA: Diagnosis not present

## 2016-02-02 DIAGNOSIS — G809 Cerebral palsy, unspecified: Secondary | ICD-10-CM | POA: Diagnosis not present

## 2016-02-02 DIAGNOSIS — K8020 Calculus of gallbladder without cholecystitis without obstruction: Secondary | ICD-10-CM | POA: Diagnosis present

## 2016-02-02 DIAGNOSIS — E119 Type 2 diabetes mellitus without complications: Secondary | ICD-10-CM | POA: Insufficient documentation

## 2016-02-02 DIAGNOSIS — Q059 Spina bifida, unspecified: Secondary | ICD-10-CM | POA: Insufficient documentation

## 2016-02-02 DIAGNOSIS — R070 Pain in throat: Secondary | ICD-10-CM | POA: Insufficient documentation

## 2016-02-02 DIAGNOSIS — I129 Hypertensive chronic kidney disease with stage 1 through stage 4 chronic kidney disease, or unspecified chronic kidney disease: Secondary | ICD-10-CM | POA: Insufficient documentation

## 2016-02-02 DIAGNOSIS — K219 Gastro-esophageal reflux disease without esophagitis: Secondary | ICD-10-CM | POA: Diagnosis not present

## 2016-02-02 DIAGNOSIS — Z7952 Long term (current) use of systemic steroids: Secondary | ICD-10-CM | POA: Insufficient documentation

## 2016-02-02 DIAGNOSIS — Z880 Allergy status to penicillin: Secondary | ICD-10-CM | POA: Insufficient documentation

## 2016-02-02 HISTORY — PX: CHOLECYSTECTOMY: SHX55

## 2016-02-02 LAB — CBC
HEMATOCRIT: 33.8 % — AB (ref 35.0–47.0)
HEMOGLOBIN: 11.2 g/dL — AB (ref 12.0–16.0)
MCH: 25.9 pg — AB (ref 26.0–34.0)
MCHC: 33.1 g/dL (ref 32.0–36.0)
MCV: 78 fL — ABNORMAL LOW (ref 80.0–100.0)
Platelets: 357 10*3/uL (ref 150–440)
RBC: 4.34 MIL/uL (ref 3.80–5.20)
RDW: 13.8 % (ref 11.5–14.5)
WBC: 17.7 10*3/uL — ABNORMAL HIGH (ref 3.6–11.0)

## 2016-02-02 LAB — POCT I-STAT 4, (NA,K, GLUC, HGB,HCT)
GLUCOSE: 139 mg/dL — AB (ref 65–99)
HCT: 42 % (ref 36.0–46.0)
Hemoglobin: 14.3 g/dL (ref 12.0–15.0)
Potassium: 3.4 mmol/L — ABNORMAL LOW (ref 3.5–5.1)
Sodium: 135 mmol/L (ref 135–145)

## 2016-02-02 LAB — GLUCOSE, CAPILLARY
GLUCOSE-CAPILLARY: 147 mg/dL — AB (ref 65–99)
Glucose-Capillary: 204 mg/dL — ABNORMAL HIGH (ref 65–99)
Glucose-Capillary: 218 mg/dL — ABNORMAL HIGH (ref 65–99)
Glucose-Capillary: 180 mg/dL — ABNORMAL HIGH (ref 65–99)
Glucose-Capillary: 131 mg/dL — ABNORMAL HIGH (ref 65–99)

## 2016-02-02 LAB — CREATININE, SERUM
Creatinine, Ser: 0.66 mg/dL (ref 0.44–1.00)
GFR calc Af Amer: >60 mL/min (ref >60)

## 2016-02-02 LAB — POCT PREGNANCY, URINE: PREG TEST UR: NEGATIVE

## 2016-02-02 SURGERY — LAPAROSCOPIC CHOLECYSTECTOMY
Anesthesia: General | Wound class: Clean Contaminated

## 2016-02-02 MED ORDER — MENTHOL 3 MG MT LOZG
1.0000 | LOZENGE | OROMUCOSAL | Status: DC | PRN
  Administered 2016-02-02: 3 mg via ORAL
  Filled 2016-02-02 (×2): qty 9

## 2016-02-02 MED ORDER — CLINDAMYCIN PHOSPHATE 900 MG/50ML IV SOLN
INTRAVENOUS | Status: AC
  Administered 2016-02-02: 900 mg via INTRAVENOUS
  Filled 2016-02-02: qty 50

## 2016-02-02 MED ORDER — METFORMIN HCL 500 MG PO TABS
500.0000 mg | ORAL_TABLET | Freq: Two times a day (BID) | ORAL | Status: DC
  Administered 2016-02-02 – 2016-02-03 (×2): 500 mg via ORAL
  Filled 2016-02-02 (×2): qty 1

## 2016-02-02 MED ORDER — DOCUSATE SODIUM 100 MG PO CAPS
100.0000 mg | ORAL_CAPSULE | Freq: Two times a day (BID) | ORAL | Status: DC
  Administered 2016-02-02 (×2): 100 mg via ORAL
  Filled 2016-02-02 (×2): qty 1

## 2016-02-02 MED ORDER — MAGIC MOUTHWASH
10.0000 mL | Freq: Four times a day (QID) | ORAL | Status: DC | PRN
  Administered 2016-02-02 (×2): 10 mL via ORAL
  Filled 2016-02-02 (×2): qty 10

## 2016-02-02 MED ORDER — VITAMIN D (ERGOCALCIFEROL) 1.25 MG (50000 UNIT) PO CAPS
50000.0000 [IU] | ORAL_CAPSULE | ORAL | Status: DC
Start: 2016-02-02 — End: 2016-02-03

## 2016-02-02 MED ORDER — KETOROLAC TROMETHAMINE 30 MG/ML IJ SOLN
30.0000 mg | Freq: Four times a day (QID) | INTRAMUSCULAR | Status: DC
  Administered 2016-02-02 – 2016-02-03 (×3): 30 mg via INTRAVENOUS
  Filled 2016-02-02 (×4): qty 1

## 2016-02-02 MED ORDER — GLYBURIDE 5 MG PO TABS
5.0000 mg | ORAL_TABLET | Freq: Two times a day (BID) | ORAL | Status: DC
  Administered 2016-02-02 – 2016-02-03 (×2): 5 mg via ORAL
  Filled 2016-02-02 (×3): qty 1

## 2016-02-02 MED ORDER — DARIFENACIN HYDROBROMIDE ER 7.5 MG PO TB24
7.5000 mg | ORAL_TABLET | Freq: Every day | ORAL | Status: DC
  Administered 2016-02-02: 7.5 mg via ORAL
  Filled 2016-02-02: qty 1

## 2016-02-02 MED ORDER — CHLORHEXIDINE GLUCONATE 4 % EX LIQD
1.0000 "application " | Freq: Once | CUTANEOUS | Status: AC
  Administered 2016-02-02: 1 via TOPICAL

## 2016-02-02 MED ORDER — PYRIDOSTIGMINE BROMIDE 60 MG PO TABS
60.0000 mg | ORAL_TABLET | ORAL | Status: DC
  Administered 2016-02-02 – 2016-02-03 (×3): 60 mg via ORAL
  Filled 2016-02-02 (×7): qty 1

## 2016-02-02 MED ORDER — MORPHINE SULFATE (PF) 4 MG/ML IV SOLN: 4.0000 mg | INTRAVENOUS | Status: DC | PRN

## 2016-02-02 MED ORDER — HYDROMORPHONE HCL 1 MG/ML IJ SOLN
INTRAMUSCULAR | Status: AC
  Administered 2016-02-02: 0.25 mg via INTRAVENOUS
  Filled 2016-02-02: qty 1

## 2016-02-02 MED ORDER — EPHEDRINE SULFATE 50 MG/ML IJ SOLN
INTRAMUSCULAR | Status: DC | PRN
  Administered 2016-02-02: 10 mg via INTRAVENOUS

## 2016-02-02 MED ORDER — NORETHIN ACE-ETH ESTRAD-FE 1-20 MG-MCG PO TABS: 1.0000 | ORAL_TABLET | Freq: Every day | ORAL | Status: DC

## 2016-02-02 MED ORDER — ONDANSETRON 8 MG PO TBDP: 4.0000 mg | ORAL_TABLET | Freq: Four times a day (QID) | ORAL | Status: DC | PRN

## 2016-02-02 MED ORDER — TRAMADOL HCL 50 MG PO TABS: 100.0000 mg | ORAL_TABLET | Freq: Four times a day (QID) | ORAL | Status: DC | PRN

## 2016-02-02 MED ORDER — LISINOPRIL 5 MG PO TABS: 5.0000 mg | ORAL_TABLET | Freq: Every day | ORAL | Status: DC

## 2016-02-02 MED ORDER — HYDROMORPHONE HCL 1 MG/ML IJ SOLN
0.2500 mg | INTRAMUSCULAR | Status: DC | PRN
  Administered 2016-02-02 (×2): 0.25 mg via INTRAVENOUS

## 2016-02-02 MED ORDER — ONDANSETRON HCL 4 MG/2ML IJ SOLN
INTRAMUSCULAR | Status: DC | PRN
  Administered 2016-02-02: 4 mg via INTRAVENOUS

## 2016-02-02 MED ORDER — MIDAZOLAM HCL 2 MG/2ML IJ SOLN
INTRAMUSCULAR | Status: DC | PRN
  Administered 2016-02-02 (×2): 1 mg via INTRAVENOUS

## 2016-02-02 MED ORDER — FLUOCINONIDE 0.05 % EX SOLN
1.0000 "application " | Freq: Every day | CUTANEOUS | Status: DC | PRN
  Filled 2016-02-02: qty 60

## 2016-02-02 MED ORDER — HEPARIN SODIUM (PORCINE) 5000 UNIT/ML IJ SOLN
5000.0000 [IU] | Freq: Three times a day (TID) | INTRAMUSCULAR | Status: DC
  Administered 2016-02-02 – 2016-02-03 (×2): 5000 [IU] via SUBCUTANEOUS
  Filled 2016-02-02 (×2): qty 1

## 2016-02-02 MED ORDER — INSULIN ASPART 100 UNIT/ML ~~LOC~~ SOLN
0.0000 [IU] | Freq: Three times a day (TID) | SUBCUTANEOUS | Status: DC
  Administered 2016-02-02: 7 [IU] via SUBCUTANEOUS
  Administered 2016-02-02: 4 [IU] via SUBCUTANEOUS
  Filled 2016-02-02: qty 4
  Filled 2016-02-02: qty 7

## 2016-02-02 MED ORDER — CHLORTHALIDONE 25 MG PO TABS
25.0000 mg | ORAL_TABLET | Freq: Every day | ORAL | Status: DC
  Administered 2016-02-02: 25 mg via ORAL
  Filled 2016-02-02: qty 1

## 2016-02-02 MED ORDER — BUPIVACAINE-EPINEPHRINE 0.25% -1:200000 IJ SOLN
INTRAMUSCULAR | Status: DC | PRN
  Administered 2016-02-02: 30 mL

## 2016-02-02 MED ORDER — PROPOFOL 10 MG/ML IV BOLUS
INTRAVENOUS | Status: DC | PRN
  Administered 2016-02-02: 150 mg via INTRAVENOUS

## 2016-02-02 MED ORDER — ONDANSETRON HCL 4 MG/2ML IJ SOLN: 4.0000 mg | Freq: Four times a day (QID) | INTRAMUSCULAR | Status: DC | PRN

## 2016-02-02 MED ORDER — HYDROCODONE-ACETAMINOPHEN 5-325 MG PO TABS: 1.0000 | ORAL_TABLET | ORAL | Status: DC | PRN

## 2016-02-02 MED ORDER — SODIUM CHLORIDE 0.9 % IV SOLN
INTRAVENOUS | Status: DC
  Administered 2016-02-02 (×2): via INTRAVENOUS

## 2016-02-02 MED ORDER — METOCLOPRAMIDE HCL 5 MG/ML IJ SOLN: 10.0000 mg | Freq: Once | INTRAMUSCULAR | Status: DC | PRN

## 2016-02-02 MED ORDER — KETOROLAC TROMETHAMINE 30 MG/ML IJ SOLN
INTRAMUSCULAR | Status: DC | PRN
  Administered 2016-02-02: 30 mg via INTRAVENOUS

## 2016-02-02 MED ORDER — ROCURONIUM BROMIDE 100 MG/10ML IV SOLN
INTRAVENOUS | Status: DC | PRN
  Administered 2016-02-02 (×2): 10 mg via INTRAVENOUS
  Administered 2016-02-02 (×2): 20 mg via INTRAVENOUS

## 2016-02-02 MED ORDER — NEOSTIGMINE METHYLSULFATE 10 MG/10ML IV SOLN
INTRAVENOUS | Status: DC | PRN
  Administered 2016-02-02: 3 mg via INTRAVENOUS

## 2016-02-02 MED ORDER — KETOCONAZOLE 2 % EX CREA
TOPICAL_CREAM | Freq: Two times a day (BID) | CUTANEOUS | Status: DC | PRN
  Filled 2016-02-02: qty 15

## 2016-02-02 MED ORDER — POTASSIUM CHLORIDE CRYS ER 20 MEQ PO TBCR
40.0000 meq | EXTENDED_RELEASE_TABLET | Freq: Two times a day (BID) | ORAL | Status: DC
  Administered 2016-02-02: 40 meq via ORAL
  Filled 2016-02-02: qty 2

## 2016-02-02 MED ORDER — FENTANYL CITRATE (PF) 100 MCG/2ML IJ SOLN
INTRAMUSCULAR | Status: DC | PRN
  Administered 2016-02-02 (×4): 50 ug via INTRAVENOUS

## 2016-02-02 MED ORDER — BUPIVACAINE-EPINEPHRINE (PF) 0.25% -1:200000 IJ SOLN
INTRAMUSCULAR | Status: AC
  Filled 2016-02-02: qty 30

## 2016-02-02 MED ORDER — INSULIN ASPART 100 UNIT/ML ~~LOC~~ SOLN: 0.0000 [IU] | Freq: Every day | SUBCUTANEOUS | Status: DC

## 2016-02-02 MED ORDER — LORATADINE 10 MG PO TABS
10.0000 mg | ORAL_TABLET | Freq: Every day | ORAL | Status: DC
  Administered 2016-02-02: 10 mg via ORAL
  Filled 2016-02-02: qty 1

## 2016-02-02 MED ORDER — GLYCOPYRROLATE 0.2 MG/ML IJ SOLN
INTRAMUSCULAR | Status: DC | PRN
  Administered 2016-02-02: 0.6 mg via INTRAVENOUS

## 2016-02-02 MED ORDER — MYCOPHENOLATE MOFETIL 250 MG PO CAPS
1500.0000 mg | ORAL_CAPSULE | Freq: Two times a day (BID) | ORAL | Status: DC
  Administered 2016-02-02: 1500 mg via ORAL
  Filled 2016-02-02 (×2): qty 6

## 2016-02-02 MED ORDER — CHLORHEXIDINE GLUCONATE 4 % EX LIQD
1.0000 "application " | Freq: Once | CUTANEOUS | Status: AC
  Administered 2016-02-01: 1 via TOPICAL

## 2016-02-02 MED ORDER — CLINDAMYCIN PHOSPHATE 900 MG/50ML IV SOLN
900.0000 mg | Freq: Once | INTRAVENOUS | Status: AC
  Administered 2016-02-02: 900 mg via INTRAVENOUS

## 2016-02-02 MED ORDER — SUCCINYLCHOLINE CHLORIDE 20 MG/ML IJ SOLN
INTRAMUSCULAR | Status: DC | PRN
  Administered 2016-02-02: 120 mg via INTRAVENOUS

## 2016-02-02 MED ORDER — IOPAMIDOL (ISOVUE-300) INJECTION 61%
INTRAVENOUS | Status: DC | PRN
  Administered 2016-02-02: 20 mL

## 2016-02-02 MED ORDER — LIDOCAINE HCL (CARDIAC) 20 MG/ML IV SOLN
INTRAVENOUS | Status: DC | PRN
  Administered 2016-02-02: 30 mg via INTRAVENOUS

## 2016-02-02 MED ORDER — ESMOLOL HCL 100 MG/10ML IV SOLN
INTRAVENOUS | Status: DC | PRN
  Administered 2016-02-02: 20 mg via INTRAVENOUS

## 2016-02-02 MED ORDER — PHENYLEPHRINE HCL 10 MG/ML IJ SOLN
INTRAMUSCULAR | Status: DC | PRN
  Administered 2016-02-02: 100 ug via INTRAVENOUS

## 2016-02-02 MED ORDER — FAMOTIDINE 20 MG PO TABS
20.0000 mg | ORAL_TABLET | Freq: Two times a day (BID) | ORAL | Status: DC
  Administered 2016-02-02: 20 mg via ORAL
  Filled 2016-02-02: qty 1

## 2016-02-02 MED ORDER — PREDNISONE 5 MG PO TABS: 12.5000 mg | ORAL_TABLET | ORAL | Status: DC

## 2016-02-02 SURGICAL SUPPLY — 41 items
APPLIER CLIP 5 13 M/L LIGAMAX5 (MISCELLANEOUS) ×6
BACTOSHIELD CHG 4% 4OZ (MISCELLANEOUS)
BAG DECANTER STRL (MISCELLANEOUS) ×3 IMPLANT
BLADE CLIPPER SURG (BLADE) ×3 IMPLANT
BLADE SURG SZ11 CARB STEEL (BLADE) ×3 IMPLANT
BRUSH SCRUB 4% CHG (MISCELLANEOUS) IMPLANT
CANISTER SUCT 3000ML PPV (MISCELLANEOUS) ×3 IMPLANT
CHLORAPREP W/TINT 26ML (MISCELLANEOUS) ×3 IMPLANT
CHOLANGIOGRAM CATH TAUT (CATHETERS) ×3 IMPLANT
CLEANER CAUTERY TIP 5X5 PAD (MISCELLANEOUS) ×1 IMPLANT
CLIP APPLIE 5 13 M/L LIGAMAX5 (MISCELLANEOUS) ×2 IMPLANT
DEVICE TROCAR PUNCTURE CLOSURE (ENDOMECHANICALS) IMPLANT
DRAPE C-ARM XRAY 36X54 (DRAPES) ×3 IMPLANT
ELECT REM PT RETURN 9FT ADLT (ELECTROSURGICAL) ×3
ELECTRODE REM PT RTRN 9FT ADLT (ELECTROSURGICAL) ×1 IMPLANT
ENDOPOUCH RETRIEVER 10 (MISCELLANEOUS) IMPLANT
GLOVE BIO SURGEON STRL SZ7 (GLOVE) ×3 IMPLANT
GOWN STRL REUS W/ TWL LRG LVL3 (GOWN DISPOSABLE) ×3 IMPLANT
GOWN STRL REUS W/TWL LRG LVL3 (GOWN DISPOSABLE) ×6
IRRIGATION STRYKERFLOW (MISCELLANEOUS) ×1 IMPLANT
IRRIGATOR STRYKERFLOW (MISCELLANEOUS) ×3
IV NS 100ML SINGLE PACK (IV SOLUTION) ×3 IMPLANT
L-HOOK LAP DISP 36CM (ELECTROSURGICAL) ×3
LHOOK LAP DISP 36CM (ELECTROSURGICAL) ×1 IMPLANT
LIQUID BAND (GAUZE/BANDAGES/DRESSINGS) ×3 IMPLANT
MARKER SKIN DUAL TIP RULER LAB (MISCELLANEOUS) IMPLANT
PACK LAP CHOLECYSTECTOMY (MISCELLANEOUS) ×3 IMPLANT
PAD CLEANER CAUTERY TIP 5X5 (MISCELLANEOUS) ×2
PENCIL ELECTRO HAND CTR (MISCELLANEOUS) ×3 IMPLANT
SCISSORS METZENBAUM CVD 33 (INSTRUMENTS) ×3 IMPLANT
SCRUB CHG 4% DYNA-HEX 4OZ (MISCELLANEOUS) IMPLANT
STOPCOCK 3 WAY  REPLAC (MISCELLANEOUS) ×3 IMPLANT
SUT ETHIBOND NAB MO 7 #0 18IN (SUTURE) IMPLANT
SUT MNCRL AB 4-0 PS2 18 (SUTURE) ×3 IMPLANT
SUT VIC AB 0 CT1 36 (SUTURE) IMPLANT
SUT VICRYL 0 AB UR-6 (SUTURE) ×6 IMPLANT
SYR 20CC LL (SYRINGE) ×3 IMPLANT
SYR 30ML LL (SYRINGE) ×3 IMPLANT
TROCAR BALLN 12MMX100 BLUNT (TROCAR) IMPLANT
TROCAR Z-THREAD OPTICAL 5X100M (TROCAR) ×9 IMPLANT
WATER STERILE IRR 1000ML POUR (IV SOLUTION) ×3 IMPLANT

## 2016-02-02 NOTE — Anesthesia Procedure Notes (Signed)
Procedure Name: Intubation Date/Time: 02/02/2016 7:37 AM Performed by: Charna Busman Pre-anesthesia Checklist: Timeout performed, Patient identified, Emergency Drugs available, Suction available and Patient being monitored Patient Re-evaluated:Patient Re-evaluated prior to inductionOxygen Delivery Method: Circle system utilized Intubation Type: Combination inhalational/ intravenous induction Ventilation: Mask ventilation without difficulty Laryngoscope Size: Miller and 3 Grade View: Grade II Tube type: Oral Tube size: 7.0 mm Number of attempts: 1 Airway Equipment and Method: Stylet Placement Confirmation: ETT inserted through vocal cords under direct vision,  positive ETCO2,  CO2 detector and breath sounds checked- equal and bilateral Secured at: 20 cm Tube secured with: Tape Dental Injury: Teeth and Oropharynx as per pre-operative assessment

## 2016-02-02 NOTE — Transfer of Care (Signed)
Immediate Anesthesia Transfer of Care Note  Patient: [name]  Procedure(s) Performed: Procedure(s): LAPAROSCOPIC CHOLECYSTECTOMY WITH CHOLANGIOGRAM (N/A)  Patient Location:    Anesthesia Type:General  Level of Consciousness: awake, oriented and patient cooperative  Airway & Oxygen Therapy: Patient Spontanous Breathing and Patient connected to face mask oxygen  Post-op Assessment: Report given to RN and Post -op Vital signs reviewed and stable  Post vital signs: Reviewed and stable  Last Vitals:  Filed Vitals:   02/02/16 0942 02/02/16 0943  BP: 142/93 142/93  Pulse: 115 110  Temp: 36.5 C 36.5 C  Resp: 16 20    Complications: No apparent anesthesia complications

## 2016-02-02 NOTE — H&P (Signed)
History of Present Illness [name] is a 30 y.o. female with a history of right upper quadrant pain for the last 3 days. Patient reports the pain is sharp started after heavy meal and now has remained constant. Pain is nonradiating and is severe in nature It's get worse when she lays on the right side. She went to the emergency room and workup performed including an ultrasound showing evidence of cholelithiasis no cholecystitis. Call bile duct mildly dilated, 7 mm. Normal LFTs and normal alkaline phosphatase. There is no evidence of obstructive jaundice. She does have a history of myasthenia gravis and spina Bifida. She is able to walk with some inserts and devices  Past Medical History Past Medical History  Diagnosis Date  . Cerebral palsy (HCC)   . Diabetes mellitus without complication (HCC)   . H/O recurrent urinary tract infection   . Psoriasis   . H/O sickle cell trait   . E. coli UTI (urinary tract infection)   . BV (bacterial vaginosis)   . Spina bifida (HCC)   . Myasthenia gravis (HCC)   . GERD (gastroesophageal reflux disease)   . Yeast infection   . Hypokalemia   . Heavy periods   . Trichimoniasis   . Hypertension   . Vaginal discharge   . DUB (dysfunctional uterine bleeding)      Past Surgical History  Procedure Laterality Date  . Hysteroscopy w/d&c  2014    benign endometrial polyps  . Adenoidectomy    . Back surgery    . Foot surgery    . Tonsillectomy    . Eye surgery      Allergies  Allergen Reactions  . Contrast Media [Iodinated Diagnostic Agents]   . Latex Other (See Comments)  . Penicillins Rash    Current Outpatient Prescriptions  Medication Sig Dispense Refill  . chlorpheniramine-HYDROcodone (TUSSIONEX) 10-8 MG/5ML SUER Take 5 mLs by mouth every 12 (twelve) hours as needed for cough. 140 mL 0  . chlorthalidone (HYGROTON) 25 MG  tablet Take 25 mg by mouth daily.    . Cholecalciferol (VITAMIN D3) 50000 units CAPS Take 1 capsule by mouth once a week.  1  . darifenacin (ENABLEX) 15 MG 24 hr tablet Take 1 tablet (15 mg total) by mouth daily. 30 tablet 0  . FLUOCINOLONE ACETONIDE BODY 0.01 % OIL Apply 1 application topically daily as needed.    . glyBURIDE (DIABETA) 5 MG tablet Take 5 mg by mouth 2 (two) times daily with a meal.    . ketoconazole (NIZORAL) 2 % cream Apply daily to flaky areas on face and under right armpit prn    . levofloxacin (LEVAQUIN) 750 MG tablet Take 1 tablet (750 mg total) by mouth daily. 5 tablet 0  . lisinopril (PRINIVIL,ZESTRIL) 5 MG tablet Take 5 mg by mouth daily.    Marland Kitchen loratadine (CLARITIN) 10 MG tablet Take 10 mg by mouth daily.    . metFORMIN (GLUCOPHAGE) 500 MG tablet Take 500 mg by mouth 2 (two) times daily with a meal.    . mycophenolate (CELLCEPT) 500 MG tablet Take 1,500 mg by mouth 2 (two) times daily.    . norethindrone-ethinyl estradiol (JUNEL FE 1/20) 1-20 MG-MCG tablet Take 1 tablet by mouth daily. 3 Package 4  . potassium chloride SA (K-DUR,KLOR-CON) 20 MEQ tablet Take 20 mEq by mouth daily.    . predniSONE (DELTASONE) 10 MG tablet Take 12.5 mg by mouth daily with breakfast.     . pyridostigmine (MESTINON) 60 MG tablet  Take 60 mg by mouth every 4 (four) hours.    . ranitidine (ZANTAC) 150 MG tablet Take 150 mg by mouth 2 (two) times daily.    . traMADol (ULTRAM) 50 MG tablet Take 1 tablet (50 mg total) by mouth every 6 (six) hours as needed. 20 tablet 0  . VESICARE 10 MG tablet Take 1 tablet by mouth daily.  0   No current facility-administered medications for this visit.    Family History Family History  Problem Relation Age of Onset  . Adopted: Yes  . Family history unknown: Yes     Social History Social History  Substance Use Topics  . Smoking status: Never Smoker    . Smokeless tobacco: Never Used  . Alcohol Use: No     ROS 10 point review of system was performed and is otherwise negative  Physical Exam Blood pressure 133/89, pulse 93, temperature 98.3 F (36.8 C), temperature source Oral, height 5' (1.524 m), weight 93.895 kg (207 lb), last menstrual period 01/20/2016.  CONSTITUTIONAL: He is in no acute distress awake alert she is obese EYES: Pupils equal, round, and reactive to light, Sclera non-icteric. EARS, NOSE, MOUTH AND THROAT: The oropharynx is clear. Oral mucosa is pink and moist. Hearing is intact to voice.  NECK: Trachea is midline, and there is no jugular venous distension. Thyroid is without palpable abnormalities. LYMPH NODES: Lymph nodes in the neck are not enlarged. RESPIRATORY: Lungs are clear, and breath sounds are equal bilaterally. Normal respiratory effort without pathologic use of accessory muscles. CARDIOVASCULAR: Heart is regular without murmurs, gallops, or rubs. GI: The abdomen is soft, tender to palpation in the right upper quadrant and also tender in the costal margin. No peritonitis and no evidence of Murphy sign. There were no palpable masses. There was no hepatosplenomegaly.  MUSCULOSKELETAL: Normal muscle strength and tone in all four extremities.  SKIN: Skin turgor is normal. There are no pathologic skin lesions.  NEUROLOGIC: Motor and sensation is grossly normal. Cranial nerves are grossly intact. PSYCH: Alert and oriented to person, place and time. Affect is normal.  Data Reviewed U/s CBD 7 mm  I have personally reviewed the patient's imaging and medical records.   Assessment/Plan Symptomatic cholelithiasis. Discussed with the patient in detail about my recommendation for a laparoscopic cholecystectomy possible open with intraoperative cholangiogram. We also discussed possibility that there might be a component of intercostal nerve neuralgia that Will not go away after the  cholecystectomy. The risks, benefits, complications, treatment options, and expected outcomes were discussed with the patient. The possibilities of bleeding, recurrent infection, finding a normal gallbladder, perforation of viscus organs, damage to surrounding structures, bile leak, abscess formation, needing a drain placed, the need for additional procedures, reaction to medication, pulmonary aspiration, failure to diagnose a condition, the possible need to convert to an open procedure, and creating a complication requiring transfusion or operation were discussed with the patient. The patient and/or family concurred with the proposed plan, giving informed consent.  We will place the pt on observation and DC her tomorrow am. K 3.4 this am. All questions answered.      Sterling Big, MD FACS

## 2016-02-02 NOTE — Consult Note (Signed)
[name], [name] [id number] May 29, 1986 [name] Nearing, MD  Reason for Consult: Uvula swelling Requesting Physician: Jules Husbands, MD Consulting Physician: [name] Nearing, MD  HPI: This 30 y.o. year old female was admitted on 7/0/9628 for BILLIARY COLIC. This patient is status post cholecystectomy under general endotracheal anesthesia, and was noted to have a swollen uvula postoperatively by the nursing staff. She complains of sore throat and says the uvula feels like it sitting on her tongue. There is been a little bit of blood noted. She is able to swallow liquids and does feel like she can eat.  Medications:  Current Facility-Administered Medications  Medication Dose Route Frequency Provider Last Rate Last Dose  . chlorthalidone (HYGROTON) tablet 25 mg  25 mg Oral Daily Jules Husbands, MD   25 mg at 02/02/16 1346  . darifenacin (ENABLEX) 24 hr tablet 7.5 mg  7.5 mg Oral Daily Diego F Pabon, MD   7.5 mg at 02/02/16 1347  . docusate sodium (COLACE) capsule 100 mg  100 mg Oral BID Clayburn Pert, MD   100 mg at 02/02/16 1346  . famotidine (PEPCID) tablet 20 mg  20 mg Oral BID Diego F Pabon, MD      . fluocinonide (LIDEX) 0.05 % external solution 1 application  1 application Topical Daily PRN Diego F Pabon, MD      . glyBURIDE (DIABETA) tablet 5 mg  5 mg Oral BID WC Diego F Pabon, MD   5 mg at 02/02/16 1612  . heparin injection 5,000 Units  5,000 Units Subcutaneous 3 times per day Jules Husbands, MD   5,000 Units at 02/02/16 1400  . HYDROcodone-acetaminophen (NORCO/VICODIN) 5-325 MG per tablet 1-2 tablet  1-2 tablet Oral Q4H PRN Diego F Pabon, MD      . insulin aspart (novoLOG) injection 0-20 Units  0-20 Units Subcutaneous TID WC Jules Husbands, MD   7 Units at 02/02/16 1346  . insulin aspart (novoLOG) injection 0-5 Units  0-5 Units Subcutaneous QHS Diego F Pabon, MD      . ketoconazole (NIZORAL) 2 % cream   Topical BID PRN Diego F Pabon, MD      . ketorolac (TORADOL) 30 MG/ML injection 30 mg   30 mg Intravenous 4 times per day Jules Husbands, MD   30 mg at 02/02/16 1200  . lisinopril (PRINIVIL,ZESTRIL) tablet 5 mg  5 mg Oral Daily Jules Husbands, MD   5 mg at 02/02/16 1115  . loratadine (CLARITIN) tablet 10 mg  10 mg Oral Daily Jules Husbands, MD   10 mg at 02/02/16 1346  . menthol-cetylpyridinium (CEPACOL) lozenge 3 mg  1 lozenge Oral PRN Clayburn Pert, MD   3 mg at 02/02/16 1342  . metFORMIN (GLUCOPHAGE) tablet 500 mg  500 mg Oral BID WC Jules Husbands, MD   500 mg at 02/02/16 1612  . morphine 4 MG/ML injection 4 mg  4 mg Intravenous Q3H PRN Diego F Pabon, MD      . mycophenolate (CELLCEPT) capsule 1,500 mg  1,500 mg Oral BID Jules Husbands, MD   1,500 mg at 02/02/16 1115  . norethindrone-ethinyl estradiol (JUNEL FE,GILDESS FE,LOESTRIN FE) 1-20 MG-MCG per tablet 1 tablet  1 tablet Oral Daily Jules Husbands, MD   1 tablet at 02/02/16 1115  . ondansetron (ZOFRAN-ODT) disintegrating tablet 4 mg  4 mg Oral Q6H PRN Diego F Pabon, MD       Or  . ondansetron [facility]) injection 4 mg  4 mg Intravenous Q6H PRN Diego F Pabon, MD      . potassium chloride SA (K-DUR,KLOR-CON) CR tablet 40 mEq  40 mEq Oral BID Jules Husbands, MD      . Derrill Memo ON 02/03/2016] predniSONE (DELTASONE) tablet 12.5 mg  12.5 mg Oral QODAY Diego F Pabon, MD      . pyridostigmine (MESTINON) tablet 60 mg  60 mg Oral 6 times per day Jules Husbands, MD   60 mg at 02/02/16 1612  . traMADol (ULTRAM) tablet 100 mg  100 mg Oral Q6H PRN Jules Husbands, MD      . Vitamin D (Ergocalciferol) (DRISDOL) capsule 50,000 Units  50,000 Units Oral Weekly Jules Husbands, MD   50,000 Units at 02/02/16 1115  .  Medications Prior to Admission  Medication Sig Dispense Refill  . chlorthalidone (HYGROTON) 25 MG tablet Take 25 mg by mouth daily.    . Cholecalciferol (VITAMIN D3) 50000 units CAPS Take 1 capsule by mouth once a week.  1  . glyBURIDE (DIABETA) 5 MG tablet Take 5 mg by mouth 2 (two) times daily with a meal.    . lisinopril (PRINIVIL,ZESTRIL) 5  MG tablet Take 5 mg by mouth daily.    Marland Kitchen loratadine (CLARITIN) 10 MG tablet Take 10 mg by mouth daily.    . mycophenolate (CELLCEPT) 500 MG tablet Take 1,500 mg by mouth 2 (two) times daily.    . norethindrone-ethinyl estradiol (JUNEL FE 1/20) 1-20 MG-MCG tablet Take 1 tablet by mouth daily. 3 Package 4  . potassium chloride SA (K-DUR,KLOR-CON) 20 MEQ tablet Take 20 mEq by mouth daily.    . predniSONE (DELTASONE) 10 MG tablet Take 12.5 mg by mouth every other day.     . pyridostigmine (MESTINON) 60 MG tablet Take 60 mg by mouth every 4 (four) hours.    . ranitidine (ZANTAC) 150 MG tablet Take 150 mg by mouth 2 (two) times daily.    . traMADol (ULTRAM) 50 MG tablet Take 1 tablet (50 mg total) by mouth every 6 (six) hours as needed. 20 tablet 0  . VESICARE 10 MG tablet Take 1 tablet by mouth daily.  0  . FLUOCINOLONE ACETONIDE BODY 0.01 % OIL Apply 1 application topically daily as needed.    Marland Kitchen ketoconazole (NIZORAL) 2 % cream Apply daily to flaky areas on face and under right armpit prn    . metFORMIN (GLUCOPHAGE) 500 MG tablet Take 500 mg by mouth 2 (two) times daily with a meal.      Allergies:  Allergies  Allergen Reactions  . Contrast Media [Iodinated Diagnostic Agents] Itching  . Latex Rash  . Penicillins Rash    PMH:  Past Medical History  Diagnosis Date  . Cerebral palsy ([place])   . Diabetes mellitus without complication ([place])   . H/O recurrent urinary tract infection   . Psoriasis   . H/O sickle cell trait   . E. coli UTI (urinary tract infection)   . BV (bacterial vaginosis)   . Spina bifida ([place])   . Myasthenia gravis ([place])   . GERD (gastroesophageal reflux disease)   . Yeast infection   . Hypokalemia   . Heavy periods   . Trichimoniasis   . Hypertension   . Vaginal discharge   . DUB (dysfunctional uterine bleeding)   . Myasthenia gravis ([place])   . Chronic kidney disease     Frequent UTI  . Pneumonia January 12, 2016    [facility]  . PONV (  postoperative nausea and  vomiting)     Fam Hx:  Family History  Problem Relation Age of Onset  . Adopted: Yes  . Family history unknown: Yes    Soc Hx:  Social History   Social History  . Marital Status: Married    Spouse Name: N/A  . Number of Children: N/A  . Years of Education: N/A   Occupational History  . Not on file.   Social History Main Topics  . Smoking status: Never Smoker   . Smokeless tobacco: Never Used  . Alcohol Use: No  . Drug Use: No  . Sexual Activity: Yes    Birth Control/ Protection: Pill   Other Topics Concern  . Not on file   Social History Narrative    PSH:  Past Surgical History  Procedure Laterality Date  . Hysteroscopy w/d&c  2014    benign endometrial polyps  . Adenoidectomy    . Back surgery    . Foot surgery Bilateral     [facility]  . Tonsillectomy    . Dilation and curettage of uterus    . Eye surgery Right   . Excision neck nodule Left December 09, 2015    [facility]  . Cholecystectomy N/A 02/02/2016    Procedure: LAPAROSCOPIC CHOLECYSTECTOMY WITH CHOLANGIOGRAM;  Surgeon: Jules Husbands, MD;  Location: [facility] ORS;  Service: General;  Laterality: N/A;  . Procedures since admission: LAPAROSCOPIC CHOLECYSTECTOMY  ROS: Review of systems normal other than 12 systems except per HPI.  PHYSICAL EXAM  Vitals: Blood pressure 141/87, pulse 106, temperature 97.8 F (36.6 C), temperature source Oral, resp. rate 22, height 5' (1.524 m), weight 90.7 kg (199 lb 15.3 oz), last menstrual period 01/20/2016, SpO2 94 %.. General: Well-developed, Well-nourished in no acute distress Mood: Mood and affect well adjusted, pleasant and cooperative. Orientation: Grossly alert and oriented. Vocal Quality: No hoarseness. Communicates verbally. head and Face: NCAT. No facial asymmetry. No visible skin lesions. No significant facial scars. No tenderness with sinus percussion. Facial strength normal and symmetric. Ears: External ears with normal landmarks, no lesions.  External auditory canals free of infection, though there is some cerumen on the left partially obscuring the TM. Tympanic membranes appear intact with good landmarks, though the left TM is partially obscured. No middle ear effusion is appreciated. Hearing: Speech reception grossly normal. Nose: External nose normal with midline dorsum and no lesions or deformity. Nasal Cavity reveals essentially midline septum with normal inferior turbinates. No significant mucosal congestion or erythema. Nasal secretions are minimal and clear. No polyps seen on anterior rhinoscopy. Oral Cavity/ Oropharynx: Lips are normal with no lesions. Teeth no frank dental caries. Gingiva healthy with no lesions or gingivitis. Oropharynx reveals mild to moderate edema of the uvula with mild ecchymosis and some minor erosion of the mucosa. Otherwise the posterior pharynx is free of erythema or exudate. Tongue and floor of mouth are unremarkable.  Indirect Laryngoscopy/Nasopharyngoscopy: Visualization of the larynx, hypopharynx and nasopharynx is not possible in this setting with routine examination. Neck: Supple and symmetric with no palpable masses, tenderness or crepitance. The trachea is midline. Thyroid gland is soft, nontender and symmetric with no masses or enlargement. Parotid and submandibular glands are soft, nontender and symmetric, without masses. Lymphatic: Cervical lymph nodes are without palpable lymphadenopathy or tenderness. Respiratory: Normal respiratory effort without labored breathing. Cardiovascular: Carotid pulse shows regular rate and rhythm Neurologic: Cranial Nerves II through XII are grossly intact. Eyes: Gaze and Ocular Motility are grossly  normal. PERRLA. No visible nystagmus.  MEDICAL DECISION MAKING: Data Review:  Results for orders placed or performed during the hospital encounter of 02/02/16 (from the past 48 hour(s))  Glucose, capillary     Status: Abnormal   Collection Time: 02/02/16  6:12 AM   Result Value Ref Range   Glucose-Capillary 147 (H) 65 - 99 mg/dL   Comment 1 Notify RN   Pregnancy, urine POC     Status: None   Collection Time: 02/02/16  6:20 AM  Result Value Ref Range   Preg Test, Ur NEGATIVE NEGATIVE    Comment:        THE SENSITIVITY OF THIS METHODOLOGY IS >24 mIU/mL   I-STAT 4, (NA,K, GLUC, HGB,HCT)     Status: Abnormal   Collection Time: 02/02/16  6:48 AM  Result Value Ref Range   Sodium 135 135 - 145 mmol/L   Potassium 3.4 (L) 3.5 - 5.1 mmol/L   Glucose, Bld 139 (H) 65 - 99 mg/dL   HCT 42.0 36.0 - 46.0 %   Hemoglobin 14.3 12.0 - 15.0 g/dL  Glucose, capillary     Status: Abnormal   Collection Time: 02/02/16  9:45 AM  Result Value Ref Range   Glucose-Capillary 204 (H) 65 - 99 mg/dL  CBC     Status: Abnormal   Collection Time: 02/02/16 12:07 PM  Result Value Ref Range   WBC 17.7 (H) 3.6 - 11.0 K/uL   RBC 4.34 3.80 - 5.20 MIL/uL   Hemoglobin 11.2 (L) 12.0 - 16.0 g/dL   HCT 33.8 (L) 35.0 - 47.0 %   MCV 78.0 (L) 80.0 - 100.0 fL   MCH 25.9 (L) 26.0 - 34.0 pg   MCHC 33.1 32.0 - 36.0 g/dL   RDW 13.8 11.5 - 14.5 %   Platelets 357 150 - 440 K/uL  Creatinine, serum     Status: None   Collection Time: 02/02/16 12:07 PM  Result Value Ref Range   Creatinine, Ser 0.66 0.44 - 1.00 mg/dL   GFR calc non Af Amer >60 >60 mL/min   GFR calc Af Amer >60 >60 mL/min    Comment: (NOTE) The eGFR has been calculated using the CKD EPI equation. This calculation has not been validated in all clinical situations. eGFR's persistently <60 mL/min signify possible Chronic Kidney Disease.   Glucose, capillary     Status: Abnormal   Collection Time: 02/02/16 12:20 PM  Result Value Ref Range   Glucose-Capillary 218 (H) 65 - 99 mg/dL  Glucose, capillary     Status: Abnormal   Collection Time: 02/02/16  4:40 PM  Result Value Ref Range   Glucose-Capillary 180 (H) 65 - 99 mg/dL  . Dg Cholangiogram Operative  02/02/2016  CLINICAL DATA:  Cholecystectomy for cholelithiasis. EXAM:  INTRAOPERATIVE CHOLANGIOGRAM TECHNIQUE: Cholangiographic images from the C-arm fluoroscopic device were submitted for interpretation post-operatively. Please see the procedural report for the amount of contrast and the fluoroscopy time utilized. COMPARISON:  Abdominal ultrasound on 01/27/2016 FINDINGS: Intraoperative imaging with a C-arm shows mildly dilated common and central hepatic bile ducts without evidence of filling defect or obstruction. No contrast extravasation is identified. IMPRESSION: Mild dilatation of the common bile duct and visualized central hepatic ducts. No filling defects identified. Electronically Signed   By: Aletta Edouard M.D.   On: 02/02/2016 09:30  .   ASSESSMENT: Throat pain with mild to moderate uvular swelling, likely secondary to the uvula having been caught in the suction during extubation.  PLAN: I would recommend  use of topical [employer] Magic mouthwash, 10 cc gargle and spit every 4-6 hours as needed for sore throat. Soft bland diet would be appropriate, and cold liquids can help with some of the swelling. A tapered dose of prednisone could be considered, however the amount of uvular edema does not appear significant enough to warrant the additional risk of higher doses of prednisone, though she is on some baseline steroids for myasthenia gravis. No other intervention is necessary however, this will resolve with time.   [name] Nearing, MD 02/02/2016 5:00 PM

## 2016-02-02 NOTE — Transfer of Care (Signed)
Immediate Anesthesia Transfer of Care Note  Patient: [name]  Procedure(s) Performed: Procedure(s): LAPAROSCOPIC CHOLECYSTECTOMY WITH CHOLANGIOGRAM (N/A)  Patient Location: PACU  Anesthesia Type:General  Level of Consciousness: awake  Airway & Oxygen Therapy: Patient Spontanous Breathing and Patient connected to face mask oxygen  Post-op Assessment: Report given to RN and Post -op Vital signs reviewed and stable    Post vital signs: stable  Last Vitals:  Filed Vitals:   02/02/16 0942 02/02/16 0943  BP: 142/93 142/93  Pulse: 115 110  Temp: 36.5 C 36.5 C  Resp: 16 20    Complications: No apparent anesthesia complications

## 2016-02-02 NOTE — Op Note (Signed)
Laparoscopic Cholecystectomy  Pre-operative Diagnosis: Symptomatic Cholelithiasis  Post-operative Diagnosis: Acute on chronic cholecystitis  Procedure: 1. Laparoscopic lysis of adhesions taking approximately 30 minutes of total operative time                      2. Laparoscopic cholecystectomy with intraoperative cholangiogram  Surgeon: Sterling Big, MD FACS  Anesthesia: Gen. with endotracheal tube  Assistant:none   Findings: Acute on chronic Cholecystitis  CBD measuring approximately 8 mm there was no filling defects contrast was flowing antegrade to the duodenum. No evidence of CBD injuries or bile leaks. Thick adhesions from the greater omentum to the GB and thin adhesions from the duodenum to the GB.  Estimated Blood Loss: less 20 cc         Drains: none         Specimens: Gallbladder           Complications: none   Procedure Details  The patient was seen again in the Holding Room. The benefits, complications, treatment options, and expected outcomes were discussed with the patient. The risks of bleeding, infection, recurrence of symptoms, failure to resolve symptoms, bile duct damage, bile duct leak, retained common bile duct stone, bowel injury, any of which could require further surgery and/or ERCP, stent, or papillotomy were reviewed with the patient. The likelihood of improving the patient's symptoms with return to their baseline status is good.  The patient and/or family concurred with the proposed plan, giving informed consent.  The patient was taken to Operating Room, identified as [name] and the procedure verified as Laparoscopic Cholecystectomy.  A Time Out was held and the above information confirmed.  Prior to the induction of general anesthesia, antibiotic prophylaxis was administered. VTE prophylaxis was in place. General endotracheal anesthesia was then administered and tolerated well. After the induction, the abdomen was prepped with Chloraprep and  draped in the sterile fashion. The patient was positioned in the supine position.  Local anesthetic  was injected into the skin near the umbilicus and an incision made. Cut down technique was used to enter the abdominal cavity and a Hasson trochar was placed after two vicryl stitches were anchored to the fascia. Pneumoperitoneum was then created with CO2 and tolerated well without any adverse changes in the patient's vital signs.  Three 5-mm ports were placed in the right upper quadrant all under direct vision. All skin incisions  were infiltrated with a local anesthetic agent before making the incision and placing the trocars.   The patient was positioned  in reverse Trendelenburg, tilted slightly to the patient's left.  The gallbladder was identified, the fundus grasped and retracted cephalad. Adhesions were lysed bluntly and with cautery. Omentum was thickly adhere to the GB neck and there were some thin adhesions from the duodenum. The infundibulum was grasped and retracted laterally, exposing the peritoneum overlying the triangle of Calot. This was then divided and exposed in a blunt fashion. An extended critical view of the cystic duct and cystic artery was obtained.  The cystic duct was clearly identified and bluntly dissected.  Cholangiogram was performed in the standard fashion, there was a prominent CBD 8mm but no filling defects or bile leaks. Cholangiogram catheter removed.   Artery and duct were double clipped and divided.  The gallbladder was taken from the gallbladder fossa in a retrograde fashion with the electrocautery. The gallbladder was removed and placed in an Endocatch bag. The liver bed was irrigated and inspected. Hemostasis was achieved with  the electrocautery. Copious irrigation was utilized and was repeatedly aspirated until clear. No evidence of bile leak or bleeding observed  The gallbladder and Endocatch bag were then removed through the epigastric port site.    Inspection of  the right upper quadrant was performed. No bleeding, bile duct injury or leak, or bowel injury was noted. Pneumoperitoneum was released.  The periumbilical port site was closed with interrumpted 0 Vicryl sutures. 4-0 subcuticular Monocryl was used to close the skin. Dermabond was  applied.  The patient was then extubated and brought to the recovery room in stable condition. Sponge, lap, and needle counts were correct at closure and at the conclusion of the case.               Sterling Big, MD, FACS

## 2016-02-02 NOTE — Anesthesia Preprocedure Evaluation (Signed)
Anesthesia Evaluation  Patient identified by MRN, date of birth, ID band Patient awake    Reviewed: Allergy & Precautions, NPO status , Patient's Chart, lab work & pertinent test results  History of Anesthesia Complications (+) PONV  Airway Mallampati: I  TM Distance: >3 FB Neck ROM: Full    Dental  (+) Teeth Intact   Pulmonary    Pulmonary exam normal        Cardiovascular Exercise Tolerance: Good hypertension, Pt. on medications Normal cardiovascular exam     Neuro/Psych Myasthenia Gravis--treated, mestinon and prednisone.  Neuromuscular disease    GI/Hepatic GERD  Medicated and Controlled,  Endo/Other  diabetes, Type 2BG 147.  Renal/GU      Musculoskeletal   Abdominal (+) + obese,   Peds  Hematology   Anesthesia Other Findings   Reproductive/Obstetrics                             Anesthesia Physical Anesthesia Plan  ASA: III  Anesthesia Plan: General   Post-op Pain Management:    Induction: Intravenous  Airway Management Planned: Oral ETT  Additional Equipment:   Intra-op Plan:   Post-operative Plan: Extubation in OR  Informed Consent: I have reviewed the patients History and Physical, chart, labs and discussed the procedure including the risks, benefits and alternatives for the proposed anesthesia with the patient or authorized representative who has indicated his/her understanding and acceptance.     Plan Discussed with: CRNA  Anesthesia Plan Comments:         Anesthesia Quick Evaluation

## 2016-02-02 NOTE — Therapy (Signed)
.   Patient refused to demonstrate Incentive Spirometry due to sore, swollen nasopharynx /uvula  following surgery. Patient instructed on proper technique and equipment left at bedside.

## 2016-02-02 NOTE — Anesthesia Postprocedure Evaluation (Signed)
Anesthesia Post Note  Patient: [name]  Procedure(s) Performed: Procedure(s) (LRB): LAPAROSCOPIC CHOLECYSTECTOMY WITH CHOLANGIOGRAM (N/A)  Patient location during evaluation: PACU Anesthesia Type: General Level of consciousness: awake Pain management: pain level controlled Vital Signs Assessment: post-procedure vital signs reviewed and stable Respiratory status: spontaneous breathing Cardiovascular status: blood pressure returned to baseline Postop Assessment: no headache Anesthetic complications: no    Last Vitals:  Filed Vitals:   02/02/16 0942 02/02/16 0943  BP: 142/93 142/93  Pulse: 115 110  Temp: 36.5 C 36.5 C  Resp: 16 20    Last Pain: There were no vitals filed for this visit.               Ndea Kilroy M

## 2016-02-03 DIAGNOSIS — K8012 Calculus of gallbladder with acute and chronic cholecystitis without obstruction: Secondary | ICD-10-CM | POA: Diagnosis not present

## 2016-02-03 LAB — SURGICAL PATHOLOGY

## 2016-02-03 LAB — GLUCOSE, CAPILLARY: Glucose-Capillary: 87 mg/dL (ref 65–99)

## 2016-02-03 MED ORDER — MAGIC MOUTHWASH: 10.0000 mL | Freq: Four times a day (QID) | ORAL | Status: DC | PRN

## 2016-02-03 MED ORDER — HYDROCODONE-ACETAMINOPHEN 5-325 MG PO TABS: 1.0000 | ORAL_TABLET | ORAL | Status: DC | PRN

## 2016-02-03 NOTE — Discharge Summary (Signed)
Patient ID: [name] MRN: [id number] DOB/AGE: 1986-01-25 30 y.o.  Admit date: 02/02/2016 Discharge date: 02/03/2016  Discharge Diagnoses:  Acute on chronic cholecystitis  Procedures Performed: Lap chole w IOC  Discharged Condition: stable  Hospital Course: 30 yo female w hx of myasthenia underwent an uneventful lap chole. Post op there was some difficulty swallowing and uvula was found to be inflamed secondary to GETA. ENT consulted and s/s Dukes magic mouth recommended. Findings were benign. The patient recovered well, at the time of DC she was ambulating, tolerating PO, AVSS her abdomen was soft, incisions were c/d/i. She will have f/u 1-2 weeks.   Discharge Orders: Discharge Instructions    Call MD for:  difficulty breathing, headache or visual disturbances    Complete by:  As directed      Call MD for:  persistant dizziness or light-headedness    Complete by:  As directed      Call MD for:  persistant nausea and vomiting    Complete by:  As directed      Call MD for:  redness, tenderness, or signs of infection (pain, swelling, redness, odor or green/yellow discharge around incision site)    Complete by:  As directed      Call MD for:  severe uncontrolled pain    Complete by:  As directed      Call MD for:  temperature >100.4    Complete by:  As directed      Diet - low sodium heart healthy    Complete by:  As directed      Discharge instructions    Complete by:  As directed   No heavy lifting less 20 lbs     Increase activity slowly    Complete by:  As directed      Lifting restrictions    Complete by:  As directed   20 lbs x 6 weeks     No wound care    Complete by:  As directed            Disposition: 01-Home or Self Care  Discharge Medications:  Current facility-administered medications:  .  chlorthalidone (HYGROTON) tablet 25 mg, 25 mg, Oral, Daily, Eugine Bubb F Tysean Vandervliet, MD, 25 mg at 02/02/16 1346 .  darifenacin (ENABLEX) 24 hr tablet 7.5 mg, 7.5 mg, Oral,  Daily, Jamee Keach F Maykayla Highley, MD, 7.5 mg at 02/02/16 1347 .  docusate sodium (COLACE) capsule 100 mg, 100 mg, Oral, BID, Ricarda Frame, MD, 100 mg at 02/02/16 2119 .  famotidine (PEPCID) tablet 20 mg, 20 mg, Oral, BID, Leafy Ro, MD, 20 mg at 02/02/16 2119 .  fluocinonide (LIDEX) 0.05 % external solution 1 application, 1 application, Topical, Daily PRN, Ronnita Paz F Kelten Enochs, MD .  glyBURIDE (DIABETA) tablet 5 mg, 5 mg, Oral, BID WC, Kyren Knick F Arie Gable, MD, 5 mg at 02/02/16 1612 .  heparin injection 5,000 Units, 5,000 Units, Subcutaneous, 3 times per day, Leafy Ro, MD, 5,000 Units at 02/03/16 0522 .  HYDROcodone-acetaminophen (NORCO/VICODIN) 5-325 MG per tablet 1-2 tablet, 1-2 tablet, Oral, Q4H PRN, Destina Mantei F Clarivel Callaway, MD .  insulin aspart (novoLOG) injection 0-20 Units, 0-20 Units, Subcutaneous, TID WC, Leafy Ro, MD, 4 Units at 02/02/16 1754 .  insulin aspart (novoLOG) injection 0-5 Units, 0-5 Units, Subcutaneous, QHS, Leafy Ro, MD, 0 Units at 02/02/16 2224 .  ketoconazole (NIZORAL) 2 % cream, , Topical, BID PRN, Cadyn Fann F Zaidin Blyden, MD .  ketorolac (TORADOL) 30 MG/ML injection 30 mg, 30 mg, Intravenous,  4 times per day, Leafy Ro, MD, 30 mg at 02/03/16 0600 .  lisinopril (PRINIVIL,ZESTRIL) tablet 5 mg, 5 mg, Oral, Daily, Linda Grimmer F Christpher Stogsdill, MD, 5 mg at 02/02/16 1115 .  loratadine (CLARITIN) tablet 10 mg, 10 mg, Oral, Daily, Annaka Cleaver F Clint Strupp, MD, 10 mg at 02/02/16 1346 .  magic mouthwash, 10 mL, Oral, QID PRN, Geanie Logan, MD, 10 mL at 02/02/16 2233 .  menthol-cetylpyridinium (CEPACOL) lozenge 3 mg, 1 lozenge, Oral, PRN, Ricarda Frame, MD, 3 mg at 02/02/16 1342 .  metFORMIN (GLUCOPHAGE) tablet 500 mg, 500 mg, Oral, BID WC, Ossie Beltran F Yakov Bergen, MD, 500 mg at 02/02/16 1612 .  morphine 4 MG/ML injection 4 mg, 4 mg, Intravenous, Q3H PRN, Jordane Hisle F Keller Mikels, MD .  mycophenolate (CELLCEPT) capsule 1,500 mg, 1,500 mg, Oral, BID, Camdin Hegner F Karan Inclan, MD, 1,500 mg at 02/02/16 2115 .  norethindrone-ethinyl estradiol (JUNEL FE,GILDESS  FE,LOESTRIN FE) 1-20 MG-MCG per tablet 1 tablet, 1 tablet, Oral, Daily, Leafy Ro, MD, 1 tablet at 02/02/16 1115 .  ondansetron (ZOFRAN-ODT) disintegrating tablet 4 mg, 4 mg, Oral, Q6H PRN **OR** ondansetron (ZOFRAN) injection 4 mg, 4 mg, Intravenous, Q6H PRN, Deberah Adolf F Mavis Fichera, MD .  potassium chloride SA (K-DUR,KLOR-CON) CR tablet 40 mEq, 40 mEq, Oral, BID, Leafy Ro, MD, 40 mEq at 02/02/16 2117 .  predniSONE (DELTASONE) tablet 12.5 mg, 12.5 mg, Oral, QODAY, Mendell Bontempo F Aaryn Sermon, MD .  pyridostigmine (MESTINON) tablet 60 mg, 60 mg, Oral, 6 times per day, Leafy Ro, MD, 60 mg at 02/02/16 1612 .  traMADol (ULTRAM) tablet 100 mg, 100 mg, Oral, Q6H PRN, Leafy Ro, MD .  Vitamin D (Ergocalciferol) (DRISDOL) capsule 50,000 Units, 50,000 Units, Oral, Weekly, Leafy Ro, MD, 50,000 Units at 02/02/16 1115  Follwup: Follow-up Information    Follow up In 2 weeks.   Why:  For wound re-check [facility] surgical      Signed: Lateefa Crosby F Miki Blank 02/03/2016, 6:46 AM   Sterling Big, MD FACS

## 2016-02-03 NOTE — Discharge Instructions (Signed)

## 2016-02-03 NOTE — Progress Notes (Signed)
Patient discharged to home as ordered. Patient is alert and oriented. IV discontinued to right forearm site without s/s of infection or infection. Patient ambulates without assistance. Discharge instruction given, patient instructed to follow up with Dr. Excell Seltzer 02-17-2016 at 1000 am. Patient husband at the bedside to take patient home. Patient denies pain at this time. No acute distress noted.

## 2016-02-07 ENCOUNTER — Telehealth: Payer: Self-pay

## 2016-02-07 NOTE — Telephone Encounter (Signed)
Pt is s/p gall bladder removal. She missed ocp while in hospital. Stated spotting dark blood. Advised common to spot when you miss ocp. Advised to continue and use back up x 1 month.

## 2016-02-10 ENCOUNTER — Encounter: Payer: Self-pay | Admitting: General Surgery

## 2016-02-10 ENCOUNTER — Ambulatory Visit (INDEPENDENT_AMBULATORY_CARE_PROVIDER_SITE_OTHER): Payer: Medicaid Other | Admitting: General Surgery

## 2016-02-10 VITALS — BP 134/93 | HR 93 | Temp 98.3°F

## 2016-02-10 DIAGNOSIS — Z4889 Encounter for other specified surgical aftercare: Secondary | ICD-10-CM

## 2016-02-10 NOTE — Patient Instructions (Signed)
Please give [place] a call if you start having a fever, redness on your incisions and/or drainage.

## 2016-02-10 NOTE — Progress Notes (Signed)
Outpatient Surgical Follow Up  02/10/2016  [name] is an 30 y.o. female.   Chief Complaint  Patient presents with  . Routine Post Op    Laparoscopic Cholecystectomy Dr. Everlene Farrier 02/03/2016    HPI:  30 year old female returns to clinic 1 week status post laparoscopic cholecystectomy. She reports doing well. States she's had some discomfort from her surgical sites however denies any fevers, chills, nausea, vomiting, diarrhea, constipation. She did very happy with her surgical results.  Past Medical History  Diagnosis Date  . Cerebral palsy (HCC)   . Diabetes mellitus without complication (HCC)   . H/O recurrent urinary tract infection   . Psoriasis   . H/O sickle cell trait   . E. coli UTI (urinary tract infection)   . BV (bacterial vaginosis)   . Spina bifida (HCC)   . Myasthenia gravis (HCC)   . GERD (gastroesophageal reflux disease)   . Yeast infection   . Hypokalemia   . Heavy periods   . Trichimoniasis   . Hypertension   . Vaginal discharge   . DUB (dysfunctional uterine bleeding)   . Myasthenia gravis (HCC)   . Chronic kidney disease     Frequent UTI  . Pneumonia January 12, 2016    [facility]  . PONV (postoperative nausea and vomiting)     Past Surgical History  Procedure Laterality Date  . Hysteroscopy w/d&c  2014    benign endometrial polyps  . Adenoidectomy    . Back surgery    . Foot surgery Bilateral     [facility]  . Tonsillectomy    . Dilation and curettage of uterus    . Eye surgery Right   . Excision neck nodule Left December 09, 2015    [facility]  . Cholecystectomy N/A 02/02/2016    Procedure: LAPAROSCOPIC CHOLECYSTECTOMY WITH CHOLANGIOGRAM;  Surgeon: Leafy Ro, MD;  Location: [facility] ORS;  Service: General;  Laterality: N/A;    Family History  Problem Relation Age of Onset  . Adopted: Yes  . Family history unknown: Yes    Social History:  reports that she has never smoked. She has never used smokeless tobacco. She reports that  she does not drink alcohol or use illicit drugs.  Allergies:  Allergies  Allergen Reactions  . Contrast Media [Iodinated Diagnostic Agents] Itching  . Latex Rash  . Penicillins Rash    Medications reviewed.    ROS   multipoint review of systems was completed, all pertinent positives and negatives were documented in the history of present illness and remainder negative.  BP 134/93 mmHg  Pulse 93  Temp(Src) 98.3 F (36.8 C) (Oral)  LMP 01/20/2016  Physical Exam   Gen.: No acute distress  chest: Clear to auscultation Heart: Regular rate and rhythm Abdomen: Soft, nontender, nondistended. Well approximated laparoscopic cholecystectomy sites without evidence of infection or drainage.   No results found for this or any previous visit (from the past 48 hour(s)). No results found.  Assessment/Plan:  1. Aftercare following surgery  30 year old female status post laparoscopic cholecystectomy. Anticipated recovery time. Again discussed with patient. Also discussed signs and symptoms of infection return to clinic immediately should they occur. Patient voiced understanding and will follow up on an as-needed basis.     Ricarda Frame, MD FACS General Surgeon  02/10/2016,1:33 PM

## 2016-02-17 ENCOUNTER — Ambulatory Visit: Payer: Medicaid Other | Admitting: Surgery

## 2016-03-27 DIAGNOSIS — Z9049 Acquired absence of other specified parts of digestive tract: Secondary | ICD-10-CM | POA: Insufficient documentation

## 2016-04-04 IMAGING — CR DG CHOLANGIOGRAM OPERATIVE
6 series · 15 of 49 positions shown · non-contrast
Comparison: Abdominal ultrasound on 01/27/2016

CLINICAL DATA: Cholecystectomy for cholelithiasis.

EXAM:
INTRAOPERATIVE CHOLANGIOGRAM
TECHNIQUE: Cholangiographic images from the C-arm fluoroscopic device were
submitted for interpretation post-operatively. Please see the
procedural report for the amount of contrast and the fluoroscopy
time utilized.

[cont. (1 of 6)]
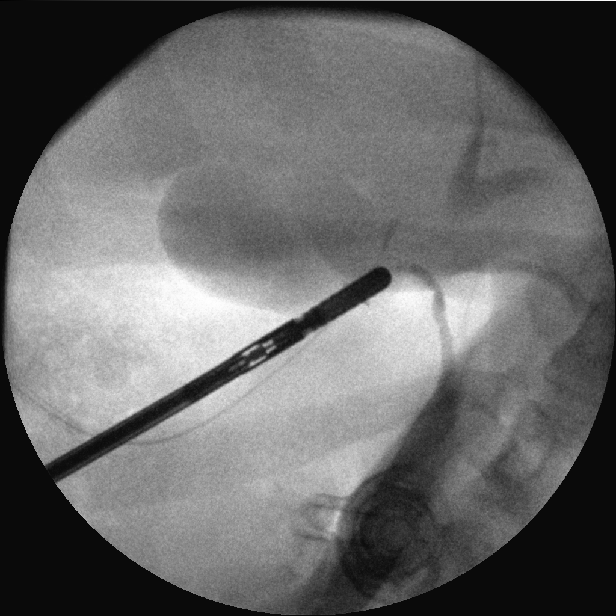

[Series 3: cont. · 3 of 52 frames shown (2 of 6)]
[frame 12/52]
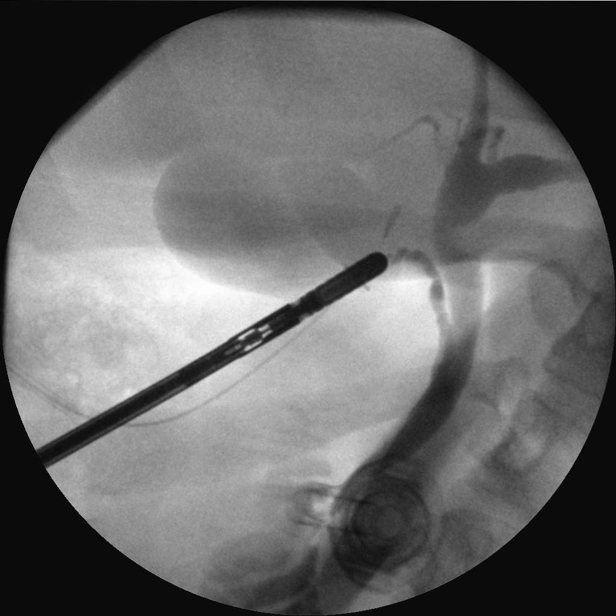
[frame 35/52]
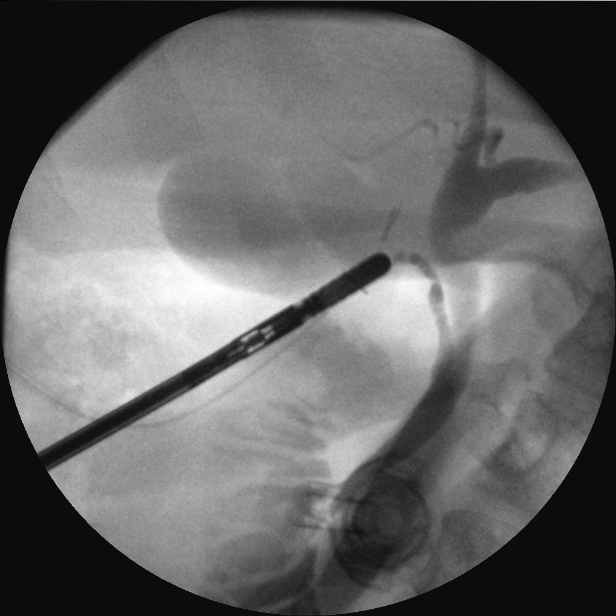
[frame 52/52]
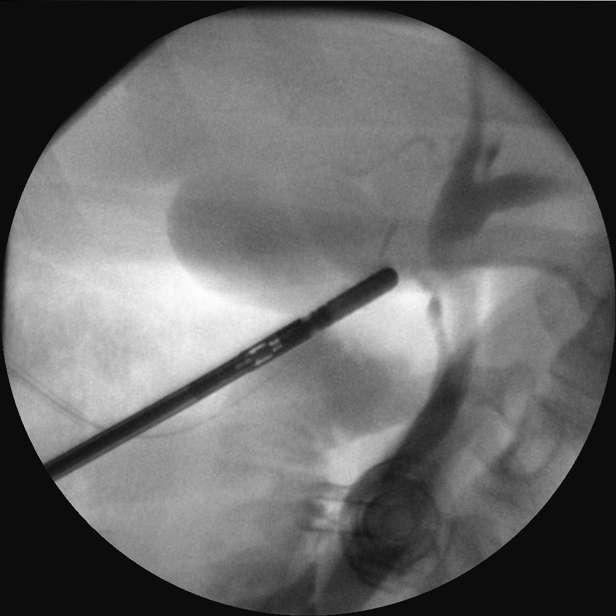

[Series 9: cont. · 3 of 29 frames shown (3 of 6)]
[frame 7/29]
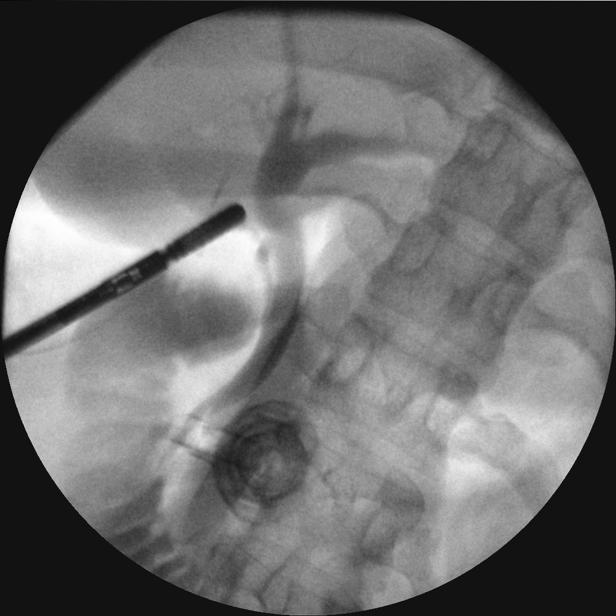
[frame 16/29]
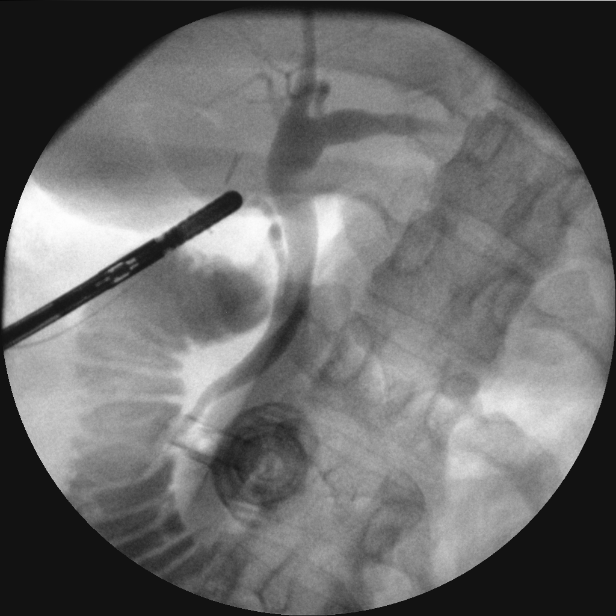
[frame 29/29]
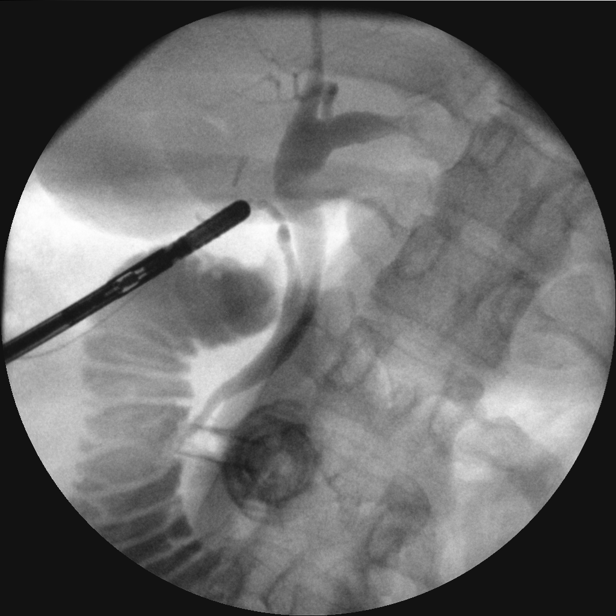

[Series 10: cont. · 3 of 55 frames shown (4 of 6)]
[frame 13/55]
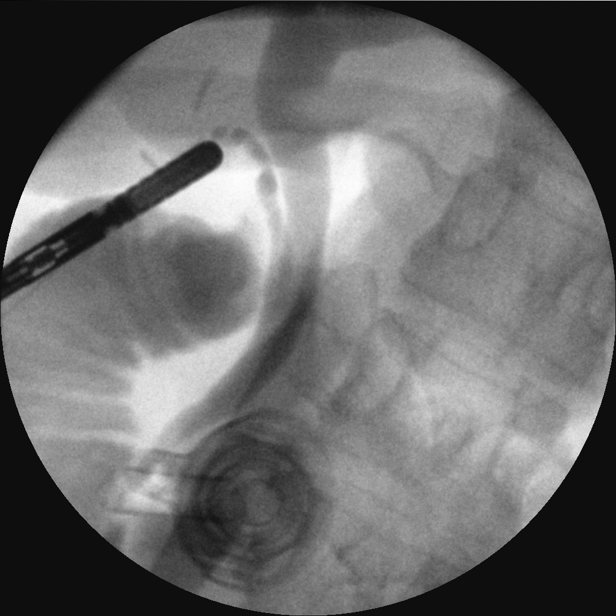
[frame 31/55]
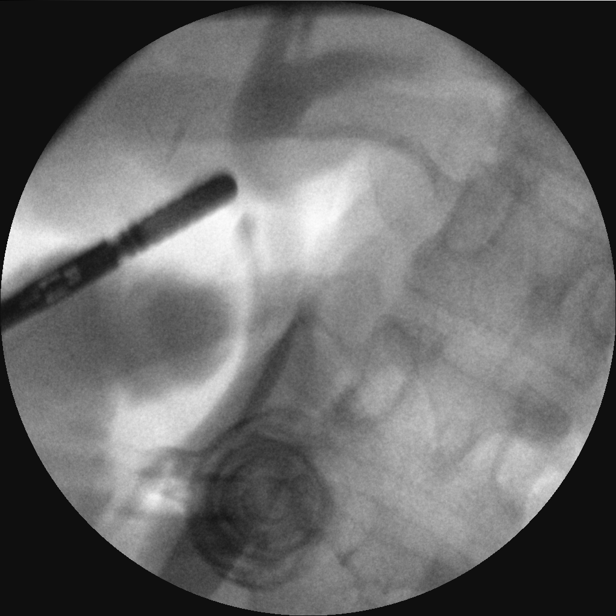
[frame 55/55]
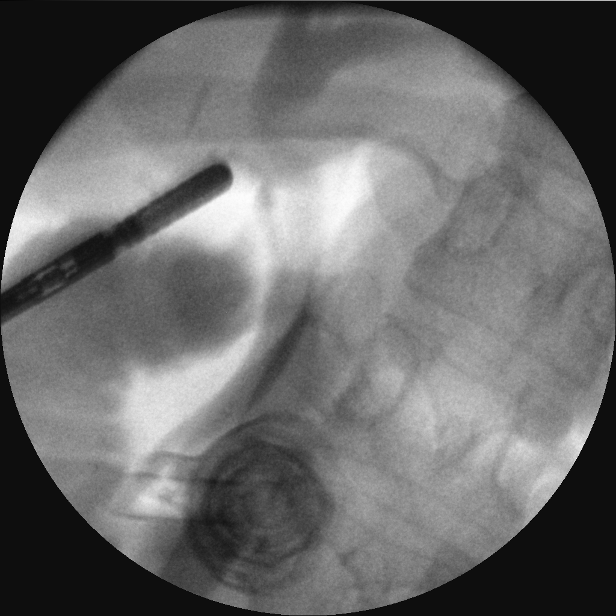

[Series 11: cont. · 3 of 19 frames shown (5 of 6)]
[frame 5/19]
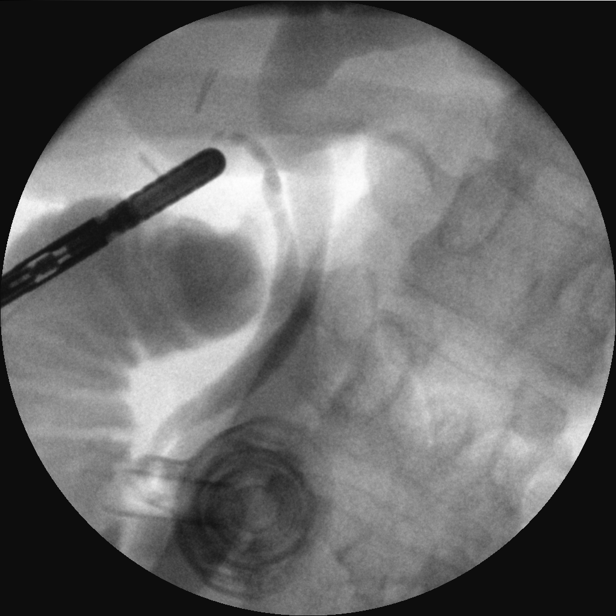
[frame 11/19]
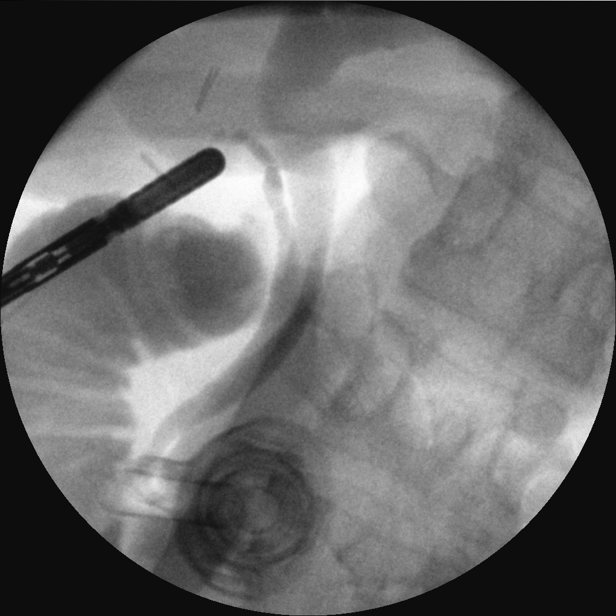
[frame 17/19]
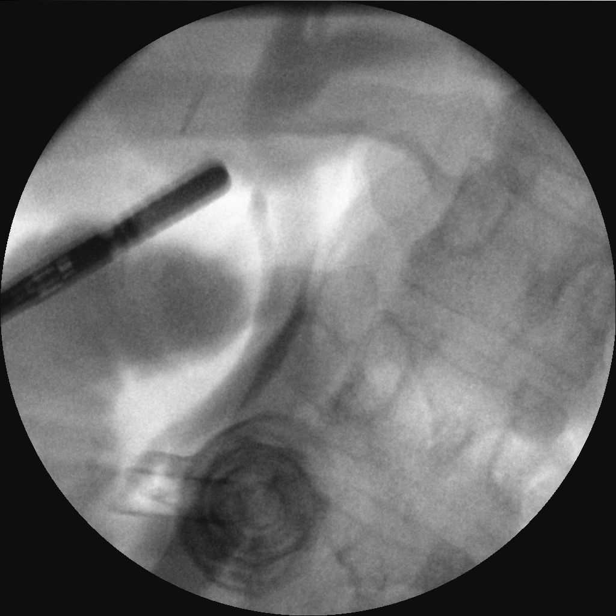

[Series 12: cont. · 2 of 7 frames shown (6 of 6)]
[frame 3/7]
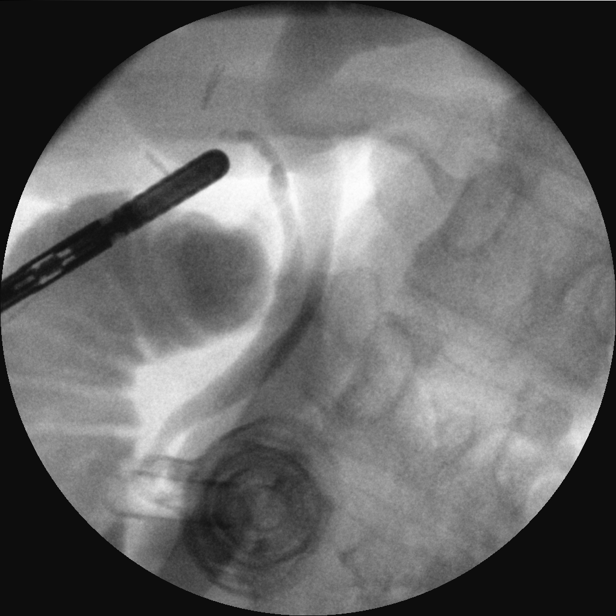
[frame 6/7]
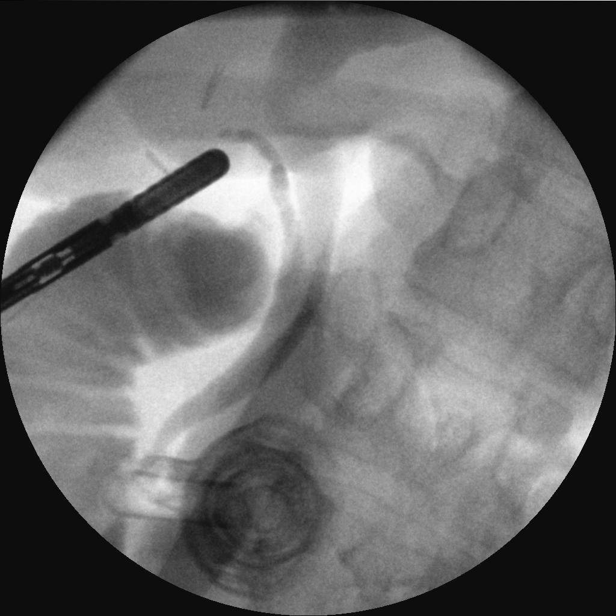

[15 of 49 positions shown; findings below may reference images not displayed]

FINDINGS: Intraoperative imaging with a C-arm shows mildly dilated common and
central hepatic bile ducts without evidence of filling defect or
obstruction. No contrast extravasation is identified.
IMPRESSION: Mild dilatation of the common bile duct and visualized central
hepatic ducts. No filling defects identified.

## 2016-04-21 ENCOUNTER — Emergency Department
Admission: EM | Admit: 2016-04-21 | Discharge: 2016-04-21 | Disposition: A | Discharge status: Home / Self Care | Payer: Medicaid Other | Attending: Emergency Medicine | Admitting: Emergency Medicine

## 2016-04-21 ENCOUNTER — Encounter: Payer: Self-pay | Admitting: Emergency Medicine

## 2016-04-21 DIAGNOSIS — S80861A Insect bite (nonvenomous), right lower leg, initial encounter: Secondary | ICD-10-CM | POA: Insufficient documentation

## 2016-04-21 DIAGNOSIS — Y929 Unspecified place or not applicable: Secondary | ICD-10-CM | POA: Insufficient documentation

## 2016-04-21 DIAGNOSIS — E1122 Type 2 diabetes mellitus with diabetic chronic kidney disease: Secondary | ICD-10-CM | POA: Diagnosis not present

## 2016-04-21 DIAGNOSIS — Z7984 Long term (current) use of oral hypoglycemic drugs: Secondary | ICD-10-CM | POA: Diagnosis not present

## 2016-04-21 DIAGNOSIS — W57XXXA Bitten or stung by nonvenomous insect and other nonvenomous arthropods, initial encounter: Secondary | ICD-10-CM | POA: Diagnosis not present

## 2016-04-21 DIAGNOSIS — N189 Chronic kidney disease, unspecified: Secondary | ICD-10-CM | POA: Diagnosis not present

## 2016-04-21 DIAGNOSIS — Z79899 Other long term (current) drug therapy: Secondary | ICD-10-CM | POA: Diagnosis not present

## 2016-04-21 DIAGNOSIS — I129 Hypertensive chronic kidney disease with stage 1 through stage 4 chronic kidney disease, or unspecified chronic kidney disease: Secondary | ICD-10-CM | POA: Insufficient documentation

## 2016-04-21 DIAGNOSIS — Y999 Unspecified external cause status: Secondary | ICD-10-CM | POA: Diagnosis not present

## 2016-04-21 DIAGNOSIS — Y939 Activity, unspecified: Secondary | ICD-10-CM | POA: Diagnosis not present

## 2016-04-21 MED ORDER — HYDROCORTISONE 0.5 % EX CREA: 1.0000 "application " | TOPICAL_CREAM | Freq: Two times a day (BID) | CUTANEOUS | Status: DC

## 2016-04-21 MED ORDER — DIPHENHYDRAMINE HCL 25 MG PO CAPS
50.0000 mg | ORAL_CAPSULE | Freq: Once | ORAL | Status: AC
Start: 2016-04-21 — End: 2016-04-21
  Administered 2016-04-21: 50 mg via ORAL
  Filled 2016-04-21: qty 2

## 2016-04-21 NOTE — ED Provider Notes (Signed)
[facility] Emergency Department Provider Note  ____________________________________________  Time seen: Approximately 10:10 AM  I have reviewed the triage vital signs and the nursing notes.   HISTORY  Chief Complaint Insect Bite    HPI [name] is a 30 y.o. female who was bit last night by something on her right lower leg. Unsure if it's a spider not complaining of increased erythema redness and itching today.   Past Medical History  Diagnosis Date  . Cerebral palsy (HCC)   . Diabetes mellitus without complication (HCC)   . H/O recurrent urinary tract infection   . Psoriasis   . H/O sickle cell trait   . E. coli UTI (urinary tract infection)   . BV (bacterial vaginosis)   . Spina bifida (HCC)   . Myasthenia gravis (HCC)   . GERD (gastroesophageal reflux disease)   . Yeast infection   . Hypokalemia   . Heavy periods   . Trichimoniasis   . Hypertension   . Vaginal discharge   . DUB (dysfunctional uterine bleeding)   . Myasthenia gravis (HCC)   . Chronic kidney disease     Frequent UTI  . Pneumonia January 12, 2016    [facility]  . PONV (postoperative nausea and vomiting)     Patient Active Problem List   Diagnosis Date Noted  . Cholecystitis 02/02/2016  . Sepsis (HCC) 01/12/2016  . Absolute anemia 08/17/2015  . Hay fever 05/17/2015  . Can't get food down 05/17/2015  . Secondary diabetes mellitus (HCC) 03/15/2015  . Other long term (current) drug therapy 04/20/2014  . Long term current use of systemic steroids 04/20/2014  . Controlled type 2 diabetes mellitus without complication (HCC) 03/17/2014  . Clinical depression 01/19/2014  . Adenopathy 01/19/2014  . CN (constipation) 08/12/2013  . Spina bifida (HCC) 08/12/2013  . Esophagitis, reflux 10/02/2011  . Avitaminosis D 10/02/2011  . Essential (primary) hypertension 05/15/2011  . Myasthenia gravis (HCC) 12/06/2010    Past Surgical History  Procedure Laterality Date  .  Hysteroscopy w/d&c  2014    benign endometrial polyps  . Adenoidectomy    . Back surgery    . Foot surgery Bilateral     [facility]  . Tonsillectomy    . Dilation and curettage of uterus    . Eye surgery Right   . Excision neck nodule Left December 09, 2015    [facility]  . Cholecystectomy N/A 02/02/2016    Procedure: LAPAROSCOPIC CHOLECYSTECTOMY WITH CHOLANGIOGRAM;  Surgeon: Leafy Ro, MD;  Location: [facility] ORS;  Service: General;  Laterality: N/A;    Current Outpatient Rx  Name  Route  Sig  Dispense  Refill  . chlorthalidone (HYGROTON) 25 MG tablet   Oral   Take 25 mg by mouth daily.         . Cholecalciferol (VITAMIN D3) 50000 units CAPS   Oral   Take 1 capsule by mouth once a week.      1   . FLUOCINOLONE ACETONIDE BODY 0.01 % OIL   Topical   Apply 1 application topically daily as needed.         . glyBURIDE (DIABETA) 5 MG tablet   Oral   Take 5 mg by mouth 2 (two) times daily with a meal.         . HYDROcodone-acetaminophen (NORCO/VICODIN) 5-325 MG tablet   Oral   Take 1-2 tablets by mouth every 4 (four) hours as needed for moderate pain.  30 tablet   0   . hydrocortisone cream 0.5 %   Topical   Apply 1 application topically 2 (two) times daily.   30 g   0   . ketoconazole (NIZORAL) 2 % cream      Apply daily to flaky areas on face and under right armpit prn         . lisinopril (PRINIVIL,ZESTRIL) 5 MG tablet   Oral   Take 5 mg by mouth daily.         Marland Kitchen. loratadine (CLARITIN) 10 MG tablet   Oral   Take 10 mg by mouth daily.         . magic mouthwash SOLN   Oral   Take 10 mLs by mouth 4 (four) times daily as needed for mouth pain.   300 mL   0   . metFORMIN (GLUCOPHAGE) 500 MG tablet   Oral   Take 500 mg by mouth 2 (two) times daily with a meal.         . mycophenolate (CELLCEPT) 500 MG tablet   Oral   Take 1,500 mg by mouth 2 (two) times daily.         . norethindrone-ethinyl estradiol (JUNEL FE 1/20) 1-20 MG-MCG  tablet   Oral   Take 1 tablet by mouth daily.   3 Package   4   . potassium chloride SA (K-DUR,KLOR-CON) 20 MEQ tablet   Oral   Take 20 mEq by mouth daily.         . predniSONE (DELTASONE) 10 MG tablet   Oral   Take 12.5 mg by mouth every other day.          . pyridostigmine (MESTINON) 60 MG tablet   Oral   Take 60 mg by mouth every 4 (four) hours.         . ranitidine (ZANTAC) 150 MG tablet   Oral   Take 150 mg by mouth 2 (two) times daily.         . traMADol (ULTRAM) 50 MG tablet   Oral   Take 1 tablet (50 mg total) by mouth every 6 (six) hours as needed.   20 tablet   0   . VESICARE 10 MG tablet   Oral   Take 1 tablet by mouth daily.      0     Dispense as written.     Allergies Contrast media; Latex; and Penicillins  Family History  Problem Relation Age of Onset  . Adopted: Yes  . Family history unknown: Yes    Social History Social History  Substance Use Topics  . Smoking status: Never Smoker   . Smokeless tobacco: Never Used  . Alcohol Use: No    Review of Systems Constitutional: No fever/chills Skin: Positive for welts and insect bite to right lower leg. Neurological: Negative for headaches, focal weakness or numbness.  10-point ROS otherwise negative.  ____________________________________________   PHYSICAL EXAM:  VITAL SIGNS: ED Triage Vitals  Enc Vitals Group     BP 04/21/16 0837 145/97 mmHg     Pulse Rate 04/21/16 0837 90     Resp 04/21/16 0837 18     Temp 04/21/16 0837 98.9 F (37.2 C)     Temp Source 04/21/16 0837 Oral     SpO2 04/21/16 0837 100 %     Weight 04/21/16 0837 200 lb (90.719 kg)     Height 04/21/16 0837 5' (1.524 m)     Head Cir --  Peak Flow --      Pain Score 04/21/16 0837 6     Pain Loc --      Pain Edu? --      Excl. in GC? --     Constitutional: Alert and oriented. Well appearing and in no acute distress. Neurologic:  Normal speech and language. No gross focal neurologic deficits are  appreciated. No gait instability. Skin:  Skin is warm, dry and intact. Positive erythema with welts and punctate insect bite wound. Consistent with spider bite. No pustules no vesicular no drainage. Psychiatric: Mood and affect are normal. Speech and behavior are normal.  ____________________________________________   LABS (all labs ordered are listed, but only abnormal results are displayed)  Labs Reviewed - No data to display ____________________________________________     PROCEDURES  Procedure(s) performed: None  Critical Care performed: No  ____________________________________________   INITIAL IMPRESSION / ASSESSMENT AND PLAN / ED COURSE  Pertinent labs & imaging results that were available during my care of the patient were reviewed by me and considered in my medical decision making (see chart for details).  Insect bite right lower leg. Rx given for  cortisone cream. Patient to continue Benadryl over-the-counter. Patient. Return ER with any worsening symptomology. ____________________________________________   FINAL CLINICAL IMPRESSION(S) / ED DIAGNOSES  Final diagnoses:  Insect bite of leg, right, initial encounter     This chart was dictated using voice recognition software/Dragon. Despite best efforts to proofread, errors can occur which can change the meaning. Any change was purely unintentional.   Evangeline Dakin, PA-C 04/21/16 1149  Emily Filbert, MD 04/21/16 1504

## 2016-04-21 NOTE — ED Notes (Signed)
States she fell something "bite" to right lower leg yesterday  Now has red and slightly swollen area to same leg

## 2016-04-21 NOTE — Discharge Instructions (Signed)
Insect Bite Mosquitoes, flies, fleas, bedbugs, and many other insects can bite. Insect bites are different from insect stings. A sting is when poison (venom) is injected into the skin. Insect bites can cause pain or itching for a few days, but they are usually not serious. Some insects can spread diseases to people through a bite. SYMPTOMS  Symptoms of an insect bite include:  Itching or pain in the bite area.  Redness and swelling in the bite area.  An open wound (skin ulcer). In many cases, symptoms last for 2-4 days.  DIAGNOSIS  This condition is usually diagnosed based on symptoms and a physical exam. TREATMENT  Treatment is usually not needed for an insect bite. Symptoms often go away on their own. Your health care provider may recommend creams or lotions to help reduce itching. Antibiotic medicines may be prescribed if the bite becomes infected. A tetanus shot may be given in some cases. If you develop an allergic reaction to an insect bite, your health care provider will prescribe medicines to treat the reaction (antihistamines). This is rare. HOME CARE INSTRUCTIONS  Do not scratch the bite area.  Keep the bite area clean and dry. Wash the bite area daily with soap and water as told by your health care provider.  If directed, applyice to the bite area.  Put ice in a plastic bag.  Place a towel between your skin and the bag.  Leave the ice on for 20 minutes, 2-3 times per day.  To help reduce itching and swelling, try applying a baking soda paste, cortisone cream, or calamine lotion to the bite area as told by your health care provider.  Apply or take over-the-counter and prescription medicines only as told by your health care provider.  If you were prescribed an antibiotic medicine, use it as told by your health care provider. Do not stop using the antibiotic even if your condition improves.  Keep all follow-up visits as told by your health care provider. This is  important. PREVENTION   Use insect repellent. The best insect repellents contain:  DEET, picaridin, oil of lemon eucalyptus (OLE), or IR3535.  Higher amounts of an active ingredient.  When you are outdoors, wear clothing that covers your arms and legs.  Avoid opening windows that do not have window screens. SEEK MEDICAL CARE IF:  You have increased redness, swelling, or pain in the bite area.  You have a fever. SEEK IMMEDIATE MEDICAL CARE IF:   You have joint pain.   You have fluid, blood, or pus coming from the bite area.  You have a headache or neck pain.  You have unusual weakness.  You have a rash.  You have chest pain or shortness of breath.  You have abdominal pain, nausea, or vomiting.  You feel unusually tired or sleepy.   This information is not intended to replace advice given to you by your health care provider. Make sure you discuss any questions you have with your health care provider.   Document Released: 01/18/2005 Document Revised: 09/01/2015 Document Reviewed: 04/28/2015 Elsevier Interactive Patient Education 2016 Elsevier Inc[employer]  

## 2016-04-21 NOTE — ED Notes (Signed)
Patient to ER for c/o unknown insect bite yesterday. Patient states area has now developed swelling.

## 2016-06-11 ENCOUNTER — Encounter: Payer: Self-pay | Admitting: Emergency Medicine

## 2016-06-11 ENCOUNTER — Emergency Department: Payer: Medicaid Other

## 2016-06-11 ENCOUNTER — Other Ambulatory Visit: Payer: Self-pay

## 2016-06-11 ENCOUNTER — Inpatient Hospital Stay
Admission: EM | Admit: 2016-06-11 | Discharge: 2016-06-13 | DRG: 872 | Disposition: A | Discharge status: Home / Self Care | Payer: Medicaid Other | Attending: Internal Medicine | Admitting: Internal Medicine

## 2016-06-11 DIAGNOSIS — N3281 Overactive bladder: Secondary | ICD-10-CM | POA: Diagnosis present

## 2016-06-11 DIAGNOSIS — N319 Neuromuscular dysfunction of bladder, unspecified: Secondary | ICD-10-CM | POA: Diagnosis present

## 2016-06-11 DIAGNOSIS — Z7984 Long term (current) use of oral hypoglycemic drugs: Secondary | ICD-10-CM | POA: Diagnosis not present

## 2016-06-11 DIAGNOSIS — Z880 Allergy status to penicillin: Secondary | ICD-10-CM

## 2016-06-11 DIAGNOSIS — E871 Hypo-osmolality and hyponatremia: Secondary | ICD-10-CM | POA: Diagnosis present

## 2016-06-11 DIAGNOSIS — E872 Acidosis, unspecified: Secondary | ICD-10-CM

## 2016-06-11 DIAGNOSIS — E1122 Type 2 diabetes mellitus with diabetic chronic kidney disease: Secondary | ICD-10-CM | POA: Diagnosis present

## 2016-06-11 DIAGNOSIS — Z7952 Long term (current) use of systemic steroids: Secondary | ICD-10-CM | POA: Diagnosis not present

## 2016-06-11 DIAGNOSIS — G809 Cerebral palsy, unspecified: Secondary | ICD-10-CM | POA: Diagnosis present

## 2016-06-11 DIAGNOSIS — A419 Sepsis, unspecified organism: Secondary | ICD-10-CM | POA: Diagnosis present

## 2016-06-11 DIAGNOSIS — G7000 Myasthenia gravis without (acute) exacerbation: Secondary | ICD-10-CM | POA: Diagnosis present

## 2016-06-11 DIAGNOSIS — D573 Sickle-cell trait: Secondary | ICD-10-CM | POA: Diagnosis present

## 2016-06-11 DIAGNOSIS — Z91040 Latex allergy status: Secondary | ICD-10-CM

## 2016-06-11 DIAGNOSIS — Z91041 Radiographic dye allergy status: Secondary | ICD-10-CM

## 2016-06-11 DIAGNOSIS — E876 Hypokalemia: Secondary | ICD-10-CM | POA: Diagnosis present

## 2016-06-11 DIAGNOSIS — I10 Essential (primary) hypertension: Secondary | ICD-10-CM | POA: Diagnosis present

## 2016-06-11 DIAGNOSIS — Z79899 Other long term (current) drug therapy: Secondary | ICD-10-CM

## 2016-06-11 DIAGNOSIS — Z87440 Personal history of urinary (tract) infections: Secondary | ICD-10-CM | POA: Diagnosis not present

## 2016-06-11 DIAGNOSIS — K219 Gastro-esophageal reflux disease without esophagitis: Secondary | ICD-10-CM | POA: Diagnosis present

## 2016-06-11 DIAGNOSIS — Q059 Spina bifida, unspecified: Secondary | ICD-10-CM | POA: Diagnosis not present

## 2016-06-11 DIAGNOSIS — N390 Urinary tract infection, site not specified: Secondary | ICD-10-CM | POA: Diagnosis present

## 2016-06-11 DIAGNOSIS — K529 Noninfective gastroenteritis and colitis, unspecified: Secondary | ICD-10-CM

## 2016-06-11 DIAGNOSIS — D72829 Elevated white blood cell count, unspecified: Secondary | ICD-10-CM

## 2016-06-11 LAB — CBC
HCT: 39.8 % (ref 35.0–47.0)
Hemoglobin: 13.4 g/dL (ref 12.0–16.0)
MCH: 26 pg (ref 26.0–34.0)
MCHC: 33.6 g/dL (ref 32.0–36.0)
MCV: 77.2 fL — ABNORMAL LOW (ref 80.0–100.0)
Platelets: 310 10*3/uL (ref 150–440)
RBC: 5.15 MIL/uL (ref 3.80–5.20)
RDW: 13.4 % (ref 11.5–14.5)
WBC: 19.3 10*3/uL — AB (ref 3.6–11.0)

## 2016-06-11 LAB — LACTIC ACID, PLASMA
LACTIC ACID, VENOUS: 2.4 mmol/L — AB (ref 0.5–2.0)
Lactic Acid, Venous: 2.2 mmol/L (ref 0.5–2.0)

## 2016-06-11 LAB — BASIC METABOLIC PANEL
Anion gap: 15 (ref 5–15)
BUN: 9 mg/dL (ref 6–20)
CO2: 23 mmol/L (ref 22–32)
CREATININE: 0.8 mg/dL (ref 0.44–1.00)
Calcium: 8.8 mg/dL — ABNORMAL LOW (ref 8.9–10.3)
Chloride: 90 mmol/L — ABNORMAL LOW (ref 101–111)
GFR calc Af Amer: >60 mL/min (ref >60)
Glucose, Bld: 146 mg/dL — ABNORMAL HIGH (ref 65–99)
POTASSIUM: 3 mmol/L — AB (ref 3.5–5.1)
SODIUM: 128 mmol/L — AB (ref 135–145)

## 2016-06-11 LAB — URINALYSIS COMPLETE WITH MICROSCOPIC (ARMC ONLY)
Bilirubin Urine: NEGATIVE
GLUCOSE, UA: NEGATIVE mg/dL
LEUKOCYTES UA: NEGATIVE
NITRITE: POSITIVE — AB
Protein, ur: NEGATIVE mg/dL
SPECIFIC GRAVITY, URINE: 1.013 (ref 1.005–1.030)
pH: 5 (ref 5.0–8.0)

## 2016-06-11 LAB — GLUCOSE, CAPILLARY: Glucose-Capillary: 117 mg/dL — ABNORMAL HIGH (ref 65–99)

## 2016-06-11 MED ORDER — CHLORTHALIDONE 25 MG PO TABS
25.0000 mg | ORAL_TABLET | Freq: Every day | ORAL | Status: DC
  Administered 2016-06-12: 25 mg via ORAL
  Filled 2016-06-11: qty 1

## 2016-06-11 MED ORDER — PYRIDOSTIGMINE BROMIDE 60 MG PO TABS
60.0000 mg | ORAL_TABLET | ORAL | Status: DC
  Administered 2016-06-11 – 2016-06-13 (×7): 60 mg via ORAL
  Filled 2016-06-11 (×11): qty 1

## 2016-06-11 MED ORDER — LORATADINE 10 MG PO TABS
10.0000 mg | ORAL_TABLET | Freq: Every day | ORAL | Status: DC
  Administered 2016-06-12 – 2016-06-13 (×2): 10 mg via ORAL
  Filled 2016-06-11 (×2): qty 1

## 2016-06-11 MED ORDER — METFORMIN HCL 500 MG PO TABS
500.0000 mg | ORAL_TABLET | Freq: Two times a day (BID) | ORAL | Status: DC
  Administered 2016-06-12 – 2016-06-13 (×3): 500 mg via ORAL
  Filled 2016-06-11 (×3): qty 1

## 2016-06-11 MED ORDER — VITAMIN D3 1.25 MG (50000 UT) PO CAPS: 1.0000 | ORAL_CAPSULE | ORAL | Status: DC

## 2016-06-11 MED ORDER — SODIUM CHLORIDE 0.9 % IV SOLN
Freq: Once | INTRAVENOUS | Status: AC
  Administered 2016-06-11: 16:00:00 via INTRAVENOUS

## 2016-06-11 MED ORDER — SODIUM CHLORIDE 0.9 % IV SOLN: Freq: Once | INTRAVENOUS | Status: DC

## 2016-06-11 MED ORDER — SODIUM CHLORIDE 0.9 % IV SOLN
INTRAVENOUS | Status: DC
  Administered 2016-06-11 – 2016-06-12 (×2): via INTRAVENOUS

## 2016-06-11 MED ORDER — SODIUM CHLORIDE 0.9 % IV SOLN
Freq: Once | INTRAVENOUS | Status: AC
  Administered 2016-06-11: 19:00:00 via INTRAVENOUS

## 2016-06-11 MED ORDER — POTASSIUM CHLORIDE CRYS ER 20 MEQ PO TBCR
20.0000 meq | EXTENDED_RELEASE_TABLET | Freq: Every day | ORAL | Status: DC
  Administered 2016-06-12: 20 meq via ORAL
  Filled 2016-06-11: qty 1

## 2016-06-11 MED ORDER — ACETAMINOPHEN 325 MG PO TABS
650.0000 mg | ORAL_TABLET | Freq: Four times a day (QID) | ORAL | Status: DC | PRN
  Administered 2016-06-11 – 2016-06-12 (×2): 650 mg via ORAL
  Filled 2016-06-11 (×2): qty 2

## 2016-06-11 MED ORDER — ONDANSETRON HCL 4 MG/2ML IJ SOLN: 4.0000 mg | Freq: Four times a day (QID) | INTRAMUSCULAR | Status: DC | PRN

## 2016-06-11 MED ORDER — VANCOMYCIN HCL IN DEXTROSE 1-5 GM/200ML-% IV SOLN
1000.0000 mg | Freq: Once | INTRAVENOUS | Status: AC
  Administered 2016-06-11: 1000 mg via INTRAVENOUS
  Filled 2016-06-11: qty 200

## 2016-06-11 MED ORDER — TRAMADOL HCL 50 MG PO TABS: 50.0000 mg | ORAL_TABLET | Freq: Four times a day (QID) | ORAL | Status: DC | PRN

## 2016-06-11 MED ORDER — DARIFENACIN HYDROBROMIDE ER 7.5 MG PO TB24
7.5000 mg | ORAL_TABLET | Freq: Every day | ORAL | Status: DC
  Administered 2016-06-12 – 2016-06-13 (×2): 7.5 mg via ORAL
  Filled 2016-06-11 (×2): qty 1

## 2016-06-11 MED ORDER — ACETAMINOPHEN 325 MG PO TABS
650.0000 mg | ORAL_TABLET | Freq: Once | ORAL | Status: AC | PRN
  Administered 2016-06-11: 650 mg via ORAL
  Filled 2016-06-11: qty 2

## 2016-06-11 MED ORDER — ONDANSETRON HCL 4 MG/2ML IJ SOLN
4.0000 mg | Freq: Once | INTRAMUSCULAR | Status: AC
  Administered 2016-06-11: 4 mg via INTRAVENOUS
  Filled 2016-06-11: qty 2

## 2016-06-11 MED ORDER — LISINOPRIL 5 MG PO TABS
5.0000 mg | ORAL_TABLET | Freq: Every day | ORAL | Status: DC
Start: 2016-06-12 — End: 2016-06-13
  Administered 2016-06-12 – 2016-06-13 (×2): 5 mg via ORAL
  Filled 2016-06-11 (×2): qty 1

## 2016-06-11 MED ORDER — ONDANSETRON HCL 4 MG PO TABS: 4.0000 mg | ORAL_TABLET | Freq: Four times a day (QID) | ORAL | Status: DC | PRN

## 2016-06-11 MED ORDER — GLYBURIDE 5 MG PO TABS
10.0000 mg | ORAL_TABLET | Freq: Two times a day (BID) | ORAL | Status: DC
Start: 2016-06-12 — End: 2016-06-13
  Administered 2016-06-12 – 2016-06-13 (×3): 10 mg via ORAL
  Filled 2016-06-11 (×3): qty 2

## 2016-06-11 MED ORDER — MYCOPHENOLATE MOFETIL 250 MG PO CAPS
1500.0000 mg | ORAL_CAPSULE | Freq: Two times a day (BID) | ORAL | Status: DC
  Administered 2016-06-12 – 2016-06-13 (×3): 1500 mg via ORAL
  Filled 2016-06-11 (×5): qty 6

## 2016-06-11 MED ORDER — PREDNISONE 5 MG PO TABS
2.5000 mg | ORAL_TABLET | ORAL | Status: DC
  Administered 2016-06-12: 2.5 mg via ORAL
  Filled 2016-06-11 (×2): qty 1

## 2016-06-11 MED ORDER — FAMOTIDINE 20 MG PO TABS
20.0000 mg | ORAL_TABLET | Freq: Two times a day (BID) | ORAL | Status: DC
  Administered 2016-06-11 – 2016-06-13 (×3): 20 mg via ORAL
  Filled 2016-06-11 (×4): qty 1

## 2016-06-11 MED ORDER — ENOXAPARIN SODIUM 40 MG/0.4ML ~~LOC~~ SOLN
40.0000 mg | SUBCUTANEOUS | Status: DC
  Filled 2016-06-11: qty 0.4

## 2016-06-11 MED ORDER — PREDNISONE 10 MG PO TABS
10.0000 mg | ORAL_TABLET | ORAL | Status: DC
  Administered 2016-06-12: 10 mg via ORAL
  Filled 2016-06-11 (×2): qty 1

## 2016-06-11 MED ORDER — VITAMIN D (ERGOCALCIFEROL) 1.25 MG (50000 UNIT) PO CAPS
50000.0000 [IU] | ORAL_CAPSULE | ORAL | Status: DC
  Administered 2016-06-12: 50000 [IU] via ORAL
  Filled 2016-06-11: qty 1

## 2016-06-11 MED ORDER — LEVOFLOXACIN IN D5W 750 MG/150ML IV SOLN
750.0000 mg | Freq: Every day | INTRAVENOUS | Status: DC
  Filled 2016-06-11: qty 150

## 2016-06-11 MED ORDER — ACETAMINOPHEN 650 MG RE SUPP: 650.0000 mg | Freq: Four times a day (QID) | RECTAL | Status: DC | PRN

## 2016-06-11 MED ORDER — PIPERACILLIN-TAZOBACTAM 3.375 G IVPB
3.3750 g | Freq: Once | INTRAVENOUS | Status: AC
  Administered 2016-06-11: 3.375 g via INTRAVENOUS
  Filled 2016-06-11: qty 50

## 2016-06-11 MED ORDER — MIRABEGRON ER 25 MG PO TB24
25.0000 mg | ORAL_TABLET | ORAL | Status: DC
  Administered 2016-06-12 – 2016-06-13 (×2): 25 mg via ORAL
  Filled 2016-06-11 (×2): qty 1

## 2016-06-11 MED ORDER — NORETHIN ACE-ETH ESTRAD-FE 1-20 MG-MCG PO TABS: 1.0000 | ORAL_TABLET | Freq: Every day | ORAL | Status: DC

## 2016-06-11 NOTE — ED Notes (Addendum)
Patient presents to the ED with fever, dizziness, and headache since yesterday evening.  Patient states, "I went out to lunch for Father's Day today and I couldn't even eat anything."  Patient reports body aches and feeling tired.  Patient states her temp at home was 104.2.  Patient is alert and oriented x 4.  Patient reports that she has diabetes but states, "I don't check my sugar like I'm supposed to."  Patient reports that her urine has been dark.  Denies dysuria and frequency.  Patient reports history of myasthenia Gravis and self catheterizes.

## 2016-06-11 NOTE — H&P (Signed)
[facility] [place] [facility]   PATIENT NAME: [name] OverallBrigitte [name]    MR#:  [id number]  DATE OF BIRTH:  03/05/1986  DATE OF ADMISSION:  06/11/2016  PRIMARY CARE PHYSICIAN: Pcp Not In System Dr. Hezzie BumpAmy Weil  REQUESTING/REFERRING PHYSICIAN: Dr. Daryel NovemberJonathan Williams  CHIEF COMPLAINT:   Chief Complaint  Patient presents with  . Fever  . Headache  . Dizziness    HISTORY OF PRESENT ILLNESS:  [name]  is a 30 y.o. female with a known history of Myasthenia gravis, previous history of UTI, psoriasis, history of sickle cell trait, GERD, hypertension, history of chronic kidney disease, diabetes who presents to the hospital due to fever, chills and nausea and diarrhea. Patient says he got a fever as high as 104 earlier this morning at home. She had some chills, associated with some nausea but no true vomiting. She also had 2 loose bowel movements today which has been nonbloody and somewhat watery in nature. She denies any sick contacts. She does have a history of myasthenia gravis and does in and out catheterizations and has not had any dysuria or burning when she does those. She presented to the emergency room and was noted to have it elevated lactate at 2.4, tachycardic, with an abnormal urinalysis consistent with UTI and diagnosed with sepsis and hospitalist services were contacted further treatment and evaluation.  PAST MEDICAL HISTORY:   Past Medical History  Diagnosis Date  . Cerebral palsy (HCC)   . Diabetes mellitus without complication (HCC)   . H/O recurrent urinary tract infection   . Psoriasis   . H/O sickle cell trait   . E. coli UTI (urinary tract infection)   . BV (bacterial vaginosis)   . Spina bifida (HCC)   . Myasthenia gravis (HCC)   . GERD (gastroesophageal reflux disease)   . Yeast infection   . Hypokalemia   . Heavy periods   . Trichimoniasis   . Hypertension   . Vaginal discharge   . DUB (dysfunctional uterine bleeding)   . Myasthenia gravis  (HCC)   . Chronic kidney disease     Frequent UTI  . Pneumonia January 12, 2016    [facility]  . PONV (postoperative nausea and vomiting)     PAST SURGICAL HISTORY:   Past Surgical History  Procedure Laterality Date  . Hysteroscopy w/d&c  2014    benign endometrial polyps  . Adenoidectomy    . Back surgery    . [facility]  . Tonsillectomy    . Dilation and curettage of uterus    . Eye surgery Right   . Excision [facility] nodule Left December 09, 2015    [facility]  . Cholecystectomy N/A 02/02/2016    Procedure: LAPAROSCOPIC CHOLECYSTECTOMY WITH CHOLANGIOGRAM;  Surgeon: Leafy Roiego F Pabon, MD;  Location: [facility] ORS;  Service: General;  Laterality: N/A;    SOCIAL HISTORY:   Social History  Substance Use Topics  . Smoking status: Never Smoker   . Smokeless tobacco: Never Used  . Alcohol Use: No    FAMILY HISTORY:   Family History  Problem Relation Age of Onset  . Adopted: Yes  . Family history unknown: Yes    DRUG ALLERGIES:   Allergies  Allergen Reactions  . Penicillins Anaphylaxis and Rash    Has patient had a PCN reaction causing immediate rash, facial/tongue/throat swelling, SOB or lightheadedness with hypotension: Yes Has patient had a PCN reaction causing severe  rash involving mucus membranes or skin necrosis: No Has patient had a PCN reaction that required hospitalization No Has patient had a PCN reaction occurring within the last 10 years: Yes If all of the above answers are "NO", then may proceed with Cephalosporin use.  . Contrast Media [Iodinated Diagnostic Agents] Itching  . Latex Rash    REVIEW OF SYSTEMS:   Review of Systems  Constitutional: Positive for fever and chills. Negative for weight loss.  HENT: Negative for congestion, nosebleeds and tinnitus.   Eyes: Negative for blurred vision, double vision and redness.  Respiratory: Negative for cough, hemoptysis and shortness of breath.   Cardiovascular: Negative for chest  pain, orthopnea, leg swelling and PND.  Gastrointestinal: Positive for nausea. Negative for vomiting, abdominal pain, diarrhea and melena.  Genitourinary: Negative for dysuria, urgency and hematuria.  Musculoskeletal: Negative for joint pain and falls.  Neurological: Negative for dizziness, tingling, sensory change, focal weakness, seizures, weakness and headaches.  Endo/Heme/Allergies: Negative for polydipsia. Does not bruise/bleed easily.  Psychiatric/Behavioral: Negative for depression and memory loss. The patient is not nervous/anxious.     MEDICATIONS AT HOME:   Prior to Admission medications   Medication Sig Start Date End Date Taking? Authorizing Provider  chlorthalidone (HYGROTON) 25 MG tablet Take 25 mg by mouth daily.   Yes Historical Provider, MD  Cholecalciferol (VITAMIN D3) 50000 units CAPS Take 1 capsule by mouth once a week. 11/25/15  Yes Historical Provider, MD  FLUOCINOLONE ACETONIDE BODY 0.01 % OIL Apply 1 application topically daily as needed. 12/29/15  Yes Historical Provider, MD  glyBURIDE (DIABETA) 5 MG tablet Take 10 mg by mouth 2 (two) times daily with a meal. 03/27/16 03/27/17 Yes Historical Provider, MD  hydrocortisone cream 0.5 % Apply 1 application topically 2 (two) times daily. Patient taking differently: Apply 1 application topically 2 (two) times daily as needed for itching.  04/21/16  Yes Charmayne Sheer Beers, PA-C  ketoconazole (NIZORAL) 2 % cream Apply daily to flaky areas on face and under right armpit as needed. 11/17/15  Yes Historical Provider, MD  lisinopril (PRINIVIL,ZESTRIL) 5 MG tablet Take 5 mg by mouth daily.   Yes Historical Provider, MD  loratadine (CLARITIN) 10 MG tablet Take 10 mg by mouth daily.   Yes Historical Provider, MD  metFORMIN (GLUCOPHAGE) 500 MG tablet Take 500 mg by mouth 2 (two) times daily with a meal.   Yes Historical Provider, MD  mirabegron ER (MYRBETRIQ) 25 MG TB24 tablet Take 25 mg by mouth every morning. 04/25/16  Yes Historical Provider, MD   mycophenolate (CELLCEPT) 500 MG tablet Take 1,500 mg by mouth 2 (two) times daily.   Yes Historical Provider, MD  norethindrone-ethinyl estradiol (JUNEL FE 1/20) 1-20 MG-MCG tablet Take 1 tablet by mouth daily. 12/07/15  Yes Prentice Docker Defrancesco, MD  potassium chloride SA (K-DUR,KLOR-CON) 20 MEQ tablet Take 20 mEq by mouth daily.   Yes Historical Provider, MD  predniSONE (DELTASONE) 10 MG tablet Take 10 mg by mouth every other day. Take along with 2.5 mg tablet to equal 12.5 mg total dose.   Yes Historical Provider, MD  predniSONE (DELTASONE) 2.5 MG tablet Take 2.5 mg by mouth every other day. Take along 10 mg tablet to equal 12.5 mg total dose.   Yes Historical Provider, MD  pyridostigmine (MESTINON) 60 MG tablet Take 60 mg by mouth every 4 (four) hours.   Yes Historical Provider, MD  ranitidine (ZANTAC) 150 MG tablet Take 150 mg by mouth 2 (two) times daily.  Yes Historical Provider, MD  traMADol (ULTRAM) 50 MG tablet Take 1 tablet (50 mg total) by mouth every 6 (six) hours as needed. 01/27/16 01/26/17 Yes Minna Antis, MD  VESICARE 10 MG tablet Take 1 tablet by mouth daily. 11/25/15  Yes Historical Provider, MD  HYDROcodone-acetaminophen (NORCO/VICODIN) 5-325 MG tablet Take 1-2 tablets by mouth every 4 (four) hours as needed for moderate pain. 02/03/16   Diego F Pabon, MD  magic mouthwash SOLN Take 10 mLs by mouth 4 (four) times daily as needed for mouth pain. 02/03/16   Diego F Pabon, MD      VITAL SIGNS:  Blood pressure 128/79, pulse 113, temperature 98.5 F (36.9 C), temperature source Oral, resp. rate 23, last menstrual period 06/10/2016, SpO2 98 %.  PHYSICAL EXAMINATION:  Physical Exam  GENERAL:  30 y.o.-year-old patient lying in the bed in no acute distress.  EYES: Pupils equal, round, reactive to light and accommodation. No scleral icterus. Extraocular muscles intact.  HEENT: Head atraumatic, normocephalic. Oropharynx and nasopharynx clear. No oropharyngeal erythema, moist oral mucosa   NECK:  Supple, no jugular venous distention. No thyroid enlargement, no tenderness.  LUNGS: Normal breath sounds bilaterally, no wheezing, rales, rhonchi. No use of accessory muscles of respiration.  CARDIOVASCULAR: S1, S2 RRR. No murmurs, rubs, gallops, clicks.  ABDOMEN: Soft, nontender, nondistended. Bowel sounds present. No organomegaly or mass.  EXTREMITIES: No pedal edema, cyanosis, or clubbing. + 2 pedal & radial pulses b/l.   NEUROLOGIC: Cranial nerves II through XII are intact. No focal Motor or sensory deficits appreciated b/l PSYCHIATRIC: The patient is alert and oriented x 3. Good affect.  SKIN: No obvious rash, lesion, or ulcer.   LABORATORY PANEL:   CBC  Recent Labs Lab 06/11/16 1541  WBC 19.3*  HGB 13.4  HCT 39.8  PLT 310   ------------------------------------------------------------------------------------------------------------------  Chemistries   Recent Labs Lab 06/11/16 1541  NA 128*  K 3.0*  CL 90*  CO2 23  GLUCOSE 146*  BUN 9  CREATININE 0.80  CALCIUM 8.8*   ------------------------------------------------------------------------------------------------------------------  Cardiac Enzymes No results for input(s): TROPONINI in the last 168 hours. ------------------------------------------------------------------------------------------------------------------  RADIOLOGY:  Dg Chest 1 View  06/11/2016  CLINICAL DATA:  Fever, dizziness and headache. EXAM: CHEST 1 VIEW COMPARISON:  CT of the chest 01/12/2016 FINDINGS: The heart size and mediastinal contours are within normal limits. Both lungs are clear. The visualized skeletal structures are unremarkable. IMPRESSION: No active disease. Electronically Signed   By: Ted Mcalpine M.D.   On: 06/11/2016 18:14     IMPRESSION AND PLAN:   30 year old female with past medical history of myasthenia gravis, cerebral palsy, psoriasis, sickle cell trait, GERD, hypertension, diabetes, history of cerebral  palsy who presents to the hospital with fever of 104 and noted to have sepsis.  1. Sepsis-patient meets criteria given her elevated lactic acid, tachycardia, abnormal urinalysis leukocytosis. -The source is likely a urinary tract infection. Empirically patient will be treated with Levaquin. She received 1 dose of IV vancomycin, Zosyn in the ER. She has a previous history of UTI secondary to Escherichia coli, Klebsiella.  2. Urinary tract infection-likely the cause of patient's sepsis. -We'll treat patient with IV Levaquin, follow urine cultures. Patient has had a previous UTI in January of this year which grew out Klebsiella, Escherichia coli which were both sensitive to Levaquin.  3. Leukocytosis-suspect this is likely steroid mediated particularly with underlying sepsis/UTI -Follow white cell count with IV antibiotic therapy.  4. Hypokalemia-we'll supplement and repeat level in the  morning. -Check magnesium level.  5. Hyponatremia-mild secondary to sepsis. Asymptomatic. -Continue gentle IV fluids. Follow sodium.  6. History of myasthenia gravis-continue prednisone, Mestinon, CellCept.  7. History of overactive bladder-continue Myrbetric.  8. Essential hypertension-continue lisinopril, chlorthalidone.  9. Diabetes type 2 without complication-continue metformin. Continue carb-controlled diet.  10. GERD-continue Pepcid.   All the records are reviewed and case discussed with ED provider. Management plans discussed with the patient, family and they are in agreement.  CODE STATUS: Full Code  TOTAL TIME TAKING CARE OF THIS PATIENT: 45 minutes.    Houston Siren M.D on 06/11/2016 at 8:05 PM  Between 7am to 6pm - Pager - [phone]  After 6pm go to [email or web address] - password [email or web address]  [facility]  Office  [phone]  CC: Primary care physician; Pcp Not In System

## 2016-06-11 NOTE — Progress Notes (Signed)
Pt. Temp at 103.1 MD notified and orders given to try a cooling blanked. Pt informed of this new order and stated that she is not interested in the cooling blanket. I offered an ice pack and she refused that as well. She noted that she wants to wait and see if her temp will go down before she tries any of the noted order/suggestions.

## 2016-06-11 NOTE — Consult Note (Signed)
Pharmacy Antibiotic Note  [name] is a 30 y.o. female admitted on 06/11/2016 with sepsis and UTI.  Pharmacy has been consulted for levofloxacin dosing.  Plan: levofloxacin 750mg  q 24 hours crcl of 103 ml/min calculated using weight in care everywhere    Temp (24hrs), Avg:100.2 F (37.9 C), Min:98.5 F (36.9 C), Max:101.9 F (38.8 C)   Recent Labs Lab 06/11/16 1541 06/11/16 1604 06/11/16 1927  WBC 19.3*  --   --   CREATININE 0.80  --   --   LATICACIDVEN  --  2.4* 2.2*    CrCl cannot be calculated (Unknown ideal weight.).    Allergies  Allergen Reactions  . Penicillins Anaphylaxis and Rash    Has patient had a PCN reaction causing immediate rash, facial/tongue/throat swelling, SOB or lightheadedness with hypotension: Yes Has patient had a PCN reaction causing severe rash involving mucus membranes or skin necrosis: No Has patient had a PCN reaction that required hospitalization No Has patient had a PCN reaction occurring within the last 10 years: Yes If all of the above answers are "NO", then may proceed with Cephalosporin use.  . Contrast Media [Iodinated Diagnostic Agents] Itching  . Latex Rash    Antimicrobials this admission: vancomycin 6/18 >> one dose zosyn 6/18 >> one dose Levofloxacin 6/18>>  Dose adjustments this admission:   Microbiology results: 6/18 BCx:  6/18 UCx:     Thank you for allowing pharmacy to be a part of this patient's care.  Olene FlossMelissa D Domitila Stetler, Pharm.D Clinical Pharmacist   06/11/2016 8:21 PM

## 2016-06-11 NOTE — ED Provider Notes (Signed)
[facility] Emergency Department Provider Note        Time seen: ----------------------------------------- 3:58 PM on 06/11/2016 -----------------------------------------    I have reviewed the triage vital signs and the nursing notes.   HISTORY  Chief Complaint Fever; Headache; and Dizziness    HPI [name] is a 30 y.o. female who presents to ER with fever, dizziness, headache, vomiting and diarrhea. Patient states she went for lunch for Father's Day and couldn't eat anything, couldn't keep anything down. She reports body aches and feeling tired. Reports temp sure home was 104.2. She does self catheter, reports urine has been dark. Reports she has diabetes but she doesn't take her blood sugar like she supposed to.She denies dysuria, denies other complaints.   Past Medical History  Diagnosis Date  . Cerebral palsy (HCC)   . Diabetes mellitus without complication (HCC)   . H/O recurrent urinary tract infection   . Psoriasis   . H/O sickle cell trait   . E. coli UTI (urinary tract infection)   . BV (bacterial vaginosis)   . Spina bifida (HCC)   . Myasthenia gravis (HCC)   . GERD (gastroesophageal reflux disease)   . Yeast infection   . Hypokalemia   . Heavy periods   . Trichimoniasis   . Hypertension   . Vaginal discharge   . DUB (dysfunctional uterine bleeding)   . Myasthenia gravis (HCC)   . Chronic kidney disease     Frequent UTI  . Pneumonia January 12, 2016    [facility]  . PONV (postoperative nausea and vomiting)     Patient Active Problem List   Diagnosis Date Noted  . Cholecystitis 02/02/2016  . Sepsis (HCC) 01/12/2016  . Absolute anemia 08/17/2015  . Hay fever 05/17/2015  . Can't get food down 05/17/2015  . Secondary diabetes mellitus (HCC) 03/15/2015  . Other long term (current) drug therapy 04/20/2014  . Long term current use of systemic steroids 04/20/2014  . Controlled type 2 diabetes mellitus without complication (HCC)  03/17/2014  . Clinical depression 01/19/2014  . Adenopathy 01/19/2014  . CN (constipation) 08/12/2013  . Spina bifida (HCC) 08/12/2013  . Esophagitis, reflux 10/02/2011  . Avitaminosis D 10/02/2011  . Essential (primary) hypertension 05/15/2011  . Myasthenia gravis (HCC) 12/06/2010    Past Surgical History  Procedure Laterality Date  . Hysteroscopy w/d&c  2014    benign endometrial polyps  . Adenoidectomy    . Back surgery    . Foot surgery Bilateral     [facility]  . Tonsillectomy    . Dilation and curettage of uterus    . Eye surgery Right   . Excision neck nodule Left December 09, 2015    [facility]  . Cholecystectomy N/A 02/02/2016    Procedure: LAPAROSCOPIC CHOLECYSTECTOMY WITH CHOLANGIOGRAM;  Surgeon: Leafy Ro, MD;  Location: [facility] ORS;  Service: General;  Laterality: N/A;    Allergies Contrast media; Latex; and Penicillins  Social History Social History  Substance Use Topics  . Smoking status: Never Smoker   . Smokeless tobacco: Never Used  . Alcohol Use: No    Review of Systems Constitutional: Positive for fever Eyes: Negative for visual changes. ENT: Negative for sore throat. Cardiovascular: Negative for chest pain. Respiratory: Negative for shortness of breath. Gastrointestinal: Negative for abdominal pain, positive for vomiting and diarrhea Genitourinary: Negative for dysuria. Musculoskeletal: Negative for back pain. Skin: Negative for rash. Neurological: Positive for headache, weakness  10-point ROS otherwise negative.  ____________________________________________   PHYSICAL EXAM:  VITAL SIGNS: ED Triage Vitals  Enc Vitals Group     BP --      Pulse --      Resp --      Temp --      Temp src --      SpO2 --      Weight --      Height --      Head Cir --      Peak Flow --      Pain Score 06/11/16 1521 10     Pain Loc --      Pain Edu? --      Excl. in GC? --    Constitutional: Alert and oriented. Well appearing and in  no distress. Eyes: Conjunctivae are normal. PERRL. Normal extraocular movements. ENT   Head: Normocephalic and atraumatic.   Nose: No congestion/rhinnorhea.   Mouth/Throat: Mucous membranes are moist.   Neck: No stridor. Cardiovascular: Rapid rate, regular rhythm. No murmurs, rubs, or gallops. Respiratory: Normal respiratory effort without tachypnea nor retractions. Breath sounds are clear and equal bilaterally. No wheezes/rales/rhonchi. Gastrointestinal: Soft and nontender. Normal bowel sounds Musculoskeletal: Nontender with normal range of motion in all extremities. No lower extremity tenderness nor edema. Neurologic:  Normal speech and language. No gross focal neurologic deficits are appreciated.  Skin:  Skin is warm, dry and intact. No rash noted. Psychiatric: Mood and affect are normal. Speech and behavior are normal.  ____________________________________________  ED COURSE:  Pertinent labs & imaging results that were available during my care of the patient were reviewed by me and considered in my medical decision making (see chart for details). Patient is in no acute distress, will check basic labs, give IV fluids and check blood cultures.  EKG: Sinus tachycardia with rate of 116 bpm, normal PR interval, normal QRS, normal QT interval. Right superior axis deviation ____________________________________________    LABS (pertinent positives/negatives)  Labs Reviewed  BASIC METABOLIC PANEL - Abnormal; Notable for the following:    Sodium 128 (*)    Potassium 3.0 (*)    Chloride 90 (*)    Glucose, Bld 146 (*)    Calcium 8.8 (*)    All other components within normal limits  CBC - Abnormal; Notable for the following:    WBC 19.3 (*)    MCV 77.2 (*)    All other components within normal limits  URINALYSIS COMPLETEWITH MICROSCOPIC ([facility] ONLY) - Abnormal; Notable for the following:    Color, Urine YELLOW (*)    APPearance CLEAR (*)    Ketones, ur TRACE (*)    Hgb  urine dipstick 2+ (*)    Nitrite POSITIVE (*)    Bacteria, UA MANY (*)    Squamous Epithelial / LPF 0-5 (*)    All other components within normal limits  LACTIC ACID, PLASMA - Abnormal; Notable for the following:    Lactic Acid, Venous 2.4 (*)    All other components within normal limits  CULTURE, BLOOD (ROUTINE X 2)  CULTURE, BLOOD (ROUTINE X 2)  URINE CULTURE  LACTIC ACID, PLASMA  CBG MONITORING, ED   ____________________________________________  FINAL ASSESSMENT AND PLAN  Gastroenteritis, hyperglycemia, dehydration, Lactic acidosis  Plan: Patient with labs and imaging as dictated above. Patient with evidence of systemic inflammatory response. Possible early urinary tract infection. She currently feels better, has no headache. She has chronic tachycardia and chronic leukocytosis. Leukocytosis seems worse than normal, I have given broad-spectrum antibiotics to cover for  sepsis. We'll recommend observation.   Emily FilbertWilliams, Jonathan E, MD   Note: This dictation was prepared with Dragon dictation. Any transcriptional errors that result from this process are unintentional   Emily FilbertJonathan E Williams, MD 06/11/16 1901

## 2016-06-12 LAB — CBC
HCT: 33.3 % — ABNORMAL LOW (ref 35.0–47.0)
HEMOGLOBIN: 11.4 g/dL — AB (ref 12.0–16.0)
MCH: 26.1 pg (ref 26.0–34.0)
MCHC: 34.3 g/dL (ref 32.0–36.0)
MCV: 76.1 fL — AB (ref 80.0–100.0)
PLATELETS: 243 10*3/uL (ref 150–440)
RBC: 4.38 MIL/uL (ref 3.80–5.20)
RDW: 13.4 % (ref 11.5–14.5)
WBC: 17.8 10*3/uL — ABNORMAL HIGH (ref 3.6–11.0)

## 2016-06-12 LAB — BASIC METABOLIC PANEL
Anion gap: 11 (ref 5–15)
BUN: 6 mg/dL (ref 6–20)
CHLORIDE: 97 mmol/L — AB (ref 101–111)
CO2: 25 mmol/L (ref 22–32)
CREATININE: 0.63 mg/dL (ref 0.44–1.00)
Calcium: 7.7 mg/dL — ABNORMAL LOW (ref 8.9–10.3)
GFR calc non Af Amer: >60 mL/min (ref >60)
Glucose, Bld: 83 mg/dL (ref 65–99)
Potassium: 2.3 mmol/L — CL (ref 3.5–5.1)
Sodium: 133 mmol/L — ABNORMAL LOW (ref 135–145)

## 2016-06-12 LAB — POTASSIUM: POTASSIUM: 2.8 mmol/L — AB (ref 3.5–5.1)

## 2016-06-12 LAB — GLUCOSE, CAPILLARY
GLUCOSE-CAPILLARY: 113 mg/dL — AB (ref 65–99)
GLUCOSE-CAPILLARY: 165 mg/dL — AB (ref 65–99)
Glucose-Capillary: 130 mg/dL — ABNORMAL HIGH (ref 65–99)

## 2016-06-12 MED ORDER — LEVOFLOXACIN IN D5W 750 MG/150ML IV SOLN
750.0000 mg | INTRAVENOUS | Status: DC
  Filled 2016-06-12: qty 150

## 2016-06-12 MED ORDER — POTASSIUM CHLORIDE CRYS ER 20 MEQ PO TBCR
40.0000 meq | EXTENDED_RELEASE_TABLET | Freq: Once | ORAL | Status: AC
  Administered 2016-06-12: 40 meq via ORAL
  Filled 2016-06-12: qty 2

## 2016-06-12 MED ORDER — DEXTROSE 5 % IV SOLN
1.0000 g | INTRAVENOUS | Status: DC
  Administered 2016-06-12 – 2016-06-13 (×2): 1 g via INTRAVENOUS
  Filled 2016-06-12 (×3): qty 10

## 2016-06-12 MED ORDER — POTASSIUM CHLORIDE CRYS ER 20 MEQ PO TBCR
40.0000 meq | EXTENDED_RELEASE_TABLET | ORAL | Status: AC
  Administered 2016-06-12 (×2): 40 meq via ORAL
  Filled 2016-06-12 (×2): qty 2

## 2016-06-12 MED ORDER — POTASSIUM CHLORIDE IN NACL 20-0.9 MEQ/L-% IV SOLN
INTRAVENOUS | Status: DC
  Administered 2016-06-12 – 2016-06-13 (×2): via INTRAVENOUS
  Filled 2016-06-12 (×5): qty 1000

## 2016-06-12 MED ORDER — INSULIN ASPART 100 UNIT/ML ~~LOC~~ SOLN: 0.0000 [IU] | Freq: Every day | SUBCUTANEOUS | Status: DC

## 2016-06-12 MED ORDER — ENOXAPARIN SODIUM 40 MG/0.4ML ~~LOC~~ SOLN
40.0000 mg | Freq: Two times a day (BID) | SUBCUTANEOUS | Status: DC
  Administered 2016-06-12 – 2016-06-13 (×2): 40 mg via SUBCUTANEOUS
  Filled 2016-06-12 (×2): qty 0.4

## 2016-06-12 MED ORDER — IBUPROFEN 800 MG PO TABS
400.0000 mg | ORAL_TABLET | Freq: Four times a day (QID) | ORAL | Status: DC | PRN
  Administered 2016-06-12 (×2): 400 mg via ORAL
  Filled 2016-06-12 (×2): qty 1

## 2016-06-12 MED ORDER — INSULIN ASPART 100 UNIT/ML ~~LOC~~ SOLN
0.0000 [IU] | Freq: Three times a day (TID) | SUBCUTANEOUS | Status: DC
  Administered 2016-06-12 – 2016-06-13 (×2): 2 [IU] via SUBCUTANEOUS
  Filled 2016-06-12 (×2): qty 2

## 2016-06-12 NOTE — Progress Notes (Signed)
[facility] - [place] at [facility]   PATIENT NAME: [name]    MR#:  [id number]  DATE OF BIRTH:  Sep 06, 1986  SUBJECTIVE:  CHIEF COMPLAINT:   Chief Complaint  Patient presents with  . Fever  . Headache  . Dizziness   -Patient with multiple medical problems including myasthenia gravis, self-catheterization at home. -Cultures are pending. Spiked fevers last night. MAXIMUM TEMPERATURE of 103F -Feels some better this morning  REVIEW OF SYSTEMS:  Review of Systems  Constitutional: Positive for fever, chills and malaise/fatigue.  HENT: Negative for ear discharge, ear pain and nosebleeds.   Eyes: Negative for blurred vision and double vision.  Respiratory: Negative for cough, shortness of breath and wheezing.   Cardiovascular: Negative for chest pain, palpitations and leg swelling.  Gastrointestinal: Negative for nausea, vomiting, abdominal pain, diarrhea and constipation.  Genitourinary: Negative for dysuria and urgency.  Musculoskeletal: Negative for myalgias.  Neurological: Negative for dizziness, sensory change, speech change, focal weakness, seizures and headaches.    DRUG ALLERGIES:   Allergies  Allergen Reactions  . Penicillins Anaphylaxis and Rash    Has patient had a PCN reaction causing immediate rash, facial/tongue/throat swelling, SOB or lightheadedness with hypotension: Yes Has patient had a PCN reaction causing severe rash involving mucus membranes or skin necrosis: No Has patient had a PCN reaction that required hospitalization No Has patient had a PCN reaction occurring within the last 10 years: Yes If all of the above answers are "NO", then may proceed with Cephalosporin use.  . Contrast Media [Iodinated Diagnostic Agents] Itching  . Latex Rash    VITALS:  Blood pressure 113/66, pulse 94, temperature 99.8 F (37.7 C), temperature source Oral, resp. rate 18, weight 95.89 kg (211 lb 6.4 oz), last menstrual period 06/10/2016,  SpO2 98 %.  PHYSICAL EXAMINATION:  Physical Exam  GENERAL:  30 y.o.-year-old patient lying in the bed with no acute distress.  EYES: Pupils equal, round, reactive to light and accommodation. No scleral icterus. Extraocular muscles intact.  HEENT: Head atraumatic, normocephalic. Oropharynx and nasopharynx clear.  NECK:  Supple, no jugular venous distention. No thyroid enlargement, no tenderness.  LUNGS: Normal breath sounds bilaterally, no wheezing, rales,rhonchi or crepitation. No use of accessory muscles of respiration.  CARDIOVASCULAR: S1, S2 normal. No murmurs, rubs, or gallops.  ABDOMEN: Soft, nontender, nondistended. Bowel sounds present. No organomegaly or mass.  EXTREMITIES: No pedal edema, cyanosis, or clubbing.  NEUROLOGIC: Cranial nerves II through XII are intact. Muscle strength 5/5 in all extremities. Sensation intact. Gait not checked.  PSYCHIATRIC: The patient is alert and oriented x 3.  SKIN: No obvious rash, lesion, or ulcer.    LABORATORY PANEL:   CBC  Recent Labs Lab 06/12/16 0419  WBC 17.8*  HGB 11.4*  HCT 33.3*  PLT 243   ------------------------------------------------------------------------------------------------------------------  Chemistries   Recent Labs Lab 06/12/16 0419  NA 133*  K 2.3*  CL 97*  CO2 25  GLUCOSE 83  BUN 6  CREATININE 0.63  CALCIUM 7.7*   ------------------------------------------------------------------------------------------------------------------  Cardiac Enzymes No results for input(s): TROPONINI in the last 168 hours. ------------------------------------------------------------------------------------------------------------------  RADIOLOGY:  Dg Chest 1 View  06/11/2016  CLINICAL DATA:  Fever, dizziness and headache. EXAM: CHEST 1 VIEW COMPARISON:  CT of the chest 01/12/2016 FINDINGS: The heart size and mediastinal contours are within normal limits. Both lungs are clear. The visualized skeletal structures are  unremarkable. IMPRESSION: No active disease. Electronically Signed   By: Ted Mcalpine M.D.   On: 06/11/2016  18:14    EKG:   Orders placed or performed during the hospital encounter of 06/11/16  . ED EKG  . ED EKG    ASSESSMENT AND PLAN:   30 year old female with past medical history significant for myasthenia gravis, cerebral palsy, Soriano sepsis, sickle cell trait, hypertension, diabetes, history of cerebral palsy, GERD presents to hospital secondary to sepsis.  #1 sepsis-likely source urinary tract infection -Chest x-ray with no infection -Blood and urine cultures are pending. -Was started on Levaquin, apparently Levaquin can worsen myasthenia symptoms. So we'll change to Rocephin at this time until cultures are available. - If continues to spike fevers, will broaden antibiotics. -WBC is improving, still high  #2 hypokalemia-being replaced aggressively. Recheck later today  #3 neurogenic bladder-continue home medications.  #4 diabetes mellitus-patient on glyburide and metformin -Sliding-scale insulin added  #5 hypertension-continue low-dose lisinopril. Hold chlorthalidone due to hyponatremia  #6 myasthenia-continue prednisone, CellCept and Mestinon. -Outpatient follow-up recommended  #7 DVT prophylaxis-on Lovenox    All the records are reviewed and case discussed with Care Management/Social Workerr. Management plans discussed with the patient, family and they are in agreement.  CODE STATUS: Full code  TOTAL TIME TAKING CARE OF THIS PATIENT: 37 minutes.   POSSIBLE D/C IN 1-2 DAYS, DEPENDING ON CLINICAL CONDITION.   Enid BaasKALISETTI,Arienna Benegas M.D on 06/12/2016 at 12:12 PM  Between 7am to 6pm - Pager - [phone]  After 6pm go to [email or web address] - password [email or web address]  Fabio Neighb[facility]  Office  [phone]  CC: Primary care physician; Pcp Not In System

## 2016-06-12 NOTE — Progress Notes (Addendum)
Pharmacy Antibiotic Note  [name] is a 30 y.o. female admitted on 06/11/2016 with UTI.  Pharmacy has been consulted for Ceftriaxone dosing. Pt with hx of UTIs, most recently noted to have E coli and Kleb pneumo UTIs.   Pt with hx of myasthenia gravis initially ordered levofloxacin, which can worsen myasthenia gravis. Pt given one dose of Zosyn in the ED 6/18 at 1724; no notes upon chart review of allergic reaction or intolerance after dose. Spoke with pt's RN today who went into pt's room to ask if she had any reaction. Pt reported to RN she was more sleepy after it. No s/sx of rash or shortness of breathing seen thus far. Information relayed to MD; after discussion with rounding MD, will change abx to ceftriaxone.   Plan: Ceftriaxone 1 g IV q24h. Asked RN to monitor pt for s/sx of allergic reaction.    Weight: 211 lb 6.4 oz (95.89 kg)  Temp (24hrs), Avg:101.7 F (38.7 C), Min:98.5 F (36.9 C), Max:103.2 F (39.6 C)   Recent Labs Lab 06/11/16 1541 06/11/16 1604 06/11/16 1927 06/12/16 0419  WBC 19.3*  --   --  17.8*  CREATININE 0.80  --   --  0.63  LATICACIDVEN  --  2.4* 2.2*  --     Estimated Creatinine Clearance: 106.6 mL/min (by C-G formula based on Cr of 0.63).    Allergies  Allergen Reactions  . Penicillins Anaphylaxis and Rash    Has patient had a PCN reaction causing immediate rash, facial/tongue/throat swelling, SOB or lightheadedness with hypotension: Yes Has patient had a PCN reaction causing severe rash involving mucus membranes or skin necrosis: No Has patient had a PCN reaction that required hospitalization No Has patient had a PCN reaction occurring within the last 10 years: Yes If all of the above answers are "NO", then may proceed with Cephalosporin use.  . Contrast Media [Iodinated Diagnostic Agents] Itching  . Latex Rash    Antimicrobials this admission: Zosyn 6/18 x1 dose Ceftriaxone 6/19 >>   Dose adjustments this admission:   Microbiology  results: 6/18 BCx: sent 6/18 UCx: sent    Thank you for allowing pharmacy to be a part of this patient's care.  Marty HeckWang, Levander Katzenstein L 06/12/2016 12:26 PM

## 2016-06-12 NOTE — Progress Notes (Signed)
Anticoagulation monitoring(Lovenox):  30yo  ordered Lovenox 40 mg Q24h  Filed Weights   06/11/16 2047  Weight: 211 lb 6.4 oz (95.89 kg)   Body mass index is 41.29 kg/(m^2).  Lab Results  Component Value Date   CREATININE 0.63 06/12/2016   CREATININE 0.80 06/11/2016   CREATININE 0.66 02/02/2016   Estimated Creatinine Clearance: 106.6 mL/min (by C-G formula based on Cr of 0.63). Hemoglobin & Hematocrit     Component Value Date/Time   HGB 11.4* 06/12/2016 0419   HGB 13.8 01/02/2015 1509   HCT 33.3* 06/12/2016 0419   HCT 43.4 01/02/2015 1509     Per Protocol for Patient with estCrcl> 30 ml/min and BMI > 40, will transition to Lovenox 40 mg Q12h.

## 2016-06-12 NOTE — Progress Notes (Signed)
Pt temp down to 101.2, MD notified and Motrin 400 mg Q 6 hr prn ordered and admin. Pt temp down to 99.8. Pt also has critical lab this AM, K+ at 2.3. MD notified and noted supplement will be given.

## 2016-06-13 LAB — BASIC METABOLIC PANEL
Anion gap: 9 (ref 5–15)
BUN: 6 mg/dL (ref 6–20)
CHLORIDE: 101 mmol/L (ref 101–111)
CO2: 23 mmol/L (ref 22–32)
Calcium: 8.3 mg/dL — ABNORMAL LOW (ref 8.9–10.3)
Creatinine, Ser: 0.55 mg/dL (ref 0.44–1.00)
GFR calc non Af Amer: >60 mL/min (ref >60)
Glucose, Bld: 196 mg/dL — ABNORMAL HIGH (ref 65–99)
POTASSIUM: 4.7 mmol/L (ref 3.5–5.1)
SODIUM: 133 mmol/L — AB (ref 135–145)

## 2016-06-13 LAB — GLUCOSE, CAPILLARY
GLUCOSE-CAPILLARY: 74 mg/dL (ref 65–99)
Glucose-Capillary: 197 mg/dL — ABNORMAL HIGH (ref 65–99)

## 2016-06-13 LAB — POTASSIUM: Potassium: 2.6 mmol/L — CL (ref 3.5–5.1)

## 2016-06-13 MED ORDER — POTASSIUM CHLORIDE CRYS ER 20 MEQ PO TBCR
40.0000 meq | EXTENDED_RELEASE_TABLET | Freq: Every day | ORAL | Status: DC
  Administered 2016-06-13: 40 meq via ORAL
  Filled 2016-06-13: qty 2

## 2016-06-13 MED ORDER — CEPHALEXIN 500 MG PO CAPS: 500.0000 mg | ORAL_CAPSULE | Freq: Three times a day (TID) | ORAL | Status: DC

## 2016-06-13 MED ORDER — POTASSIUM CHLORIDE 10 MEQ/100ML IV SOLN
10.0000 meq | INTRAVENOUS | Status: AC
  Administered 2016-06-13 (×3): 10 meq via INTRAVENOUS
  Filled 2016-06-13 (×4): qty 100

## 2016-06-13 MED ORDER — SPIRONOLACTONE 25 MG PO TABS: 25.0000 mg | ORAL_TABLET | Freq: Every day | ORAL | Status: DC

## 2016-06-13 NOTE — Progress Notes (Signed)
Patient discharged to home as ordered. IV discontinued site clean dry and intact. Discharge instructions given to patient. Patient instructed to go to her PCP and have potassium recheck

## 2016-06-13 NOTE — Discharge Summary (Signed)
[facility] - [place] at [facility]   PATIENT NAME: [name] OverallBrigitte [name]    MR#:  [id number]  DATE OF BIRTH:  06/19/1986  DATE OF ADMISSION:  06/11/2016 ADMITTING PHYSICIAN: Houston SirenVivek J Sainani, MD  DATE OF DISCHARGE: 06/13/2016  PRIMARY CARE PHYSICIAN: Pcp Not In System    ADMISSION DIAGNOSIS:  Lactic acidosis [E87.2] Gastroenteritis [K52.9] Leukocytosis [D72.829]  DISCHARGE DIAGNOSIS:  Active Problems:   Sepsis (HCC)   SECONDARY DIAGNOSIS:   Past Medical History  Diagnosis Date  . Cerebral palsy (HCC)   . Diabetes mellitus without complication (HCC)   . H/O recurrent urinary tract infection   . Psoriasis   . H/O sickle cell trait   . E. coli UTI (urinary tract infection)   . BV (bacterial vaginosis)   . Spina bifida (HCC)   . Myasthenia gravis (HCC)   . GERD (gastroesophageal reflux disease)   . Yeast infection   . Hypokalemia   . Heavy periods   . Trichimoniasis   . Hypertension   . Vaginal discharge   . DUB (dysfunctional uterine bleeding)   . Myasthenia gravis (HCC)   . Chronic kidney disease     Frequent UTI  . Pneumonia January 12, 2016    [facility]  . PONV (postoperative nausea and vomiting)     HOSPITAL COURSE:   30 year old female with past medical history significant for myasthenia gravis, cerebral palsy, Soriano sepsis, sickle cell trait, hypertension, diabetes, history of cerebral palsy, GERD presents to hospital secondary to sepsis.  #1 sepsis-  source urinary tract infection, urine cultures growing GNR -Chest x-ray with no infection -Blood cultures are negative -received Rocephin in the hospital-discharge on Keflex. No further fevers in the hospital.  #2 Persistent hypokalemia-chronic issue. Takes potassium supplements at home -Potassium improved after IV and oral replacements in the hospital. Continue outpatient oral replacements. -Repeat potassium check in 3 days. Advised to eat lot of potassium rich foods. -Outpatient  nephrology follow-up. Might need Aldactone if needed  #3 neurogenic bladder-continue home medications.  #4 diabetes mellitus-patient on glyburide and metformin  #5 hypertension-continue low-dose lisinopril. discontinue  chlorthalidone due to hyponatremia  #6 myasthenia-continue prednisone, CellCept and Mestinon. -Outpatient follow-up recommended   Discharge today.   DISCHARGE CONDITIONS:   Stable  CONSULTS OBTAINED:   none  DRUG ALLERGIES:   Allergies  Allergen Reactions  . Penicillins Anaphylaxis and Rash    Has patient had a PCN reaction causing immediate rash, facial/tongue/throat swelling, SOB or lightheadedness with hypotension: Yes Has patient had a PCN reaction causing severe rash involving mucus membranes or skin necrosis: No Has patient had a PCN reaction that required hospitalization No Has patient had a PCN reaction occurring within the last 10 years: Yes If all of the above answers are "NO", then may proceed with Cephalosporin use.  . Contrast Media [Iodinated Diagnostic Agents] Itching  . Latex Rash    DISCHARGE MEDICATIONS:   Current Discharge Medication List    START taking these medications   Details  cephALEXin (KEFLEX) 500 MG capsule Take 1 capsule (500 mg total) by mouth 3 (three) times daily. X 5 days Qty: 5 capsule, Refills: 0      CONTINUE these medications which have NOT CHANGED   Details  Cholecalciferol (VITAMIN D3) 50000 units CAPS Take 1 capsule by mouth once a week. Refills: 1    FLUOCINOLONE ACETONIDE BODY 0.01 % OIL Apply 1 application topically daily as needed.    glyBURIDE (DIABETA) 5 MG tablet Take 10 mg by  mouth 2 (two) times daily with a meal.    hydrocortisone cream 0.5 % Apply 1 application topically 2 (two) times daily. Qty: 30 g, Refills: 0    ketoconazole (NIZORAL) 2 % cream Apply daily to flaky areas on face and under right armpit as needed.    lisinopril (PRINIVIL,ZESTRIL) 5 MG tablet Take 5 mg by mouth daily.     loratadine (CLARITIN) 10 MG tablet Take 10 mg by mouth daily.    metFORMIN (GLUCOPHAGE) 500 MG tablet Take 500 mg by mouth 2 (two) times daily with a meal.    mirabegron ER (MYRBETRIQ) 25 MG TB24 tablet Take 25 mg by mouth every morning.    mycophenolate (CELLCEPT) 500 MG tablet Take 1,500 mg by mouth 2 (two) times daily.    norethindrone-ethinyl estradiol (JUNEL FE 1/20) 1-20 MG-MCG tablet Take 1 tablet by mouth daily. Qty: 3 Package, Refills: 4    potassium chloride SA (K-DUR,KLOR-CON) 20 MEQ tablet Take 20 mEq by mouth daily.    !! predniSONE (DELTASONE) 10 MG tablet Take 10 mg by mouth every other day. Take along with 2.5 mg tablet to equal 12.5 mg total dose.    !! predniSONE (DELTASONE) 2.5 MG tablet Take 2.5 mg by mouth every other day. Take along 10 mg tablet to equal 12.5 mg total dose.    pyridostigmine (MESTINON) 60 MG tablet Take 60 mg by mouth every 4 (four) hours.    ranitidine (ZANTAC) 150 MG tablet Take 150 mg by mouth 2 (two) times daily.    traMADol (ULTRAM) 50 MG tablet Take 1 tablet (50 mg total) by mouth every 6 (six) hours as needed. Qty: 20 tablet, Refills: 0    VESICARE 10 MG tablet Take 1 tablet by mouth daily. Refills: 0    HYDROcodone-acetaminophen (NORCO/VICODIN) 5-325 MG tablet Take 1-2 tablets by mouth every 4 (four) hours as needed for moderate pain. Qty: 30 tablet, Refills: 0    magic mouthwash SOLN Take 10 mLs by mouth 4 (four) times daily as needed for mouth pain. Qty: 300 mL, Refills: 0     !! - Potential duplicate medications found. Please discuss with provider.    STOP taking these medications     chlorthalidone (HYGROTON) 25 MG tablet          DISCHARGE INSTRUCTIONS:   1. PCP follow-up in 3 days and repeat potassium checked in.   If you experience worsening of your admission symptoms, develop shortness of breath, life threatening emergency, suicidal or homicidal thoughts you must seek medical attention immediately by calling 911  or calling your MD immediately  if symptoms less severe.  You Must read complete instructions/literature along with all the possible adverse reactions/side effects for all the Medicines you take and that have been prescribed to you. Take any new Medicines after you have completely understood and accept all the possible adverse reactions/side effects.   Please note  You were cared for by a hospitalist during your hospital stay. If you have any questions about your discharge medications or the care you received while you were in the hospital after you are discharged, you can call the unit and asked to speak with the hospitalist on call if the hospitalist that took care of you is not available. Once you are discharged, your primary care physician will handle any further medical issues. Please note that NO REFILLS for any discharge medications will be authorized once you are discharged, as it is imperative that you return to your primary care  physician (or establish a relationship with a primary care physician if you do not have one) for your aftercare needs so that they can reassess your need for medications and monitor your lab values.    Today   CHIEF COMPLAINT:   Chief Complaint  Patient presents with  . Fever  . Headache  . Dizziness    VITAL SIGNS:  Blood pressure 120/92, pulse 98, temperature 98.2 F (36.8 C), temperature source Oral, resp. rate 18, weight 95.89 kg (211 lb 6.4 oz), last menstrual period 06/10/2016, SpO2 100 %.  I/O:   Intake/Output Summary (Last 24 hours) at 06/13/16 1635 Last data filed at 06/13/16 0900  Gross per 24 hour  Intake 1763.25 ml  Output   1700 ml  Net  63.25 ml    PHYSICAL EXAMINATION:   Physical Exam  GENERAL: 30 y.o.-year-old patient lying in the bed with no acute distress.  EYES: Pupils equal, round, reactive to light and accommodation. No scleral icterus. Extraocular muscles intact.  HEENT: Head atraumatic, normocephalic. Oropharynx and  nasopharynx clear.  NECK: Supple, no jugular venous distention. No thyroid enlargement, no tenderness.  LUNGS: Normal breath sounds bilaterally, no wheezing, rales,rhonchi or crepitation. No use of accessory muscles of respiration.  CARDIOVASCULAR: S1, S2 normal. No murmurs, rubs, or gallops.  ABDOMEN: Soft, nontender, nondistended. Bowel sounds present. No organomegaly or mass.  EXTREMITIES: No pedal edema, cyanosis, or clubbing.  NEUROLOGIC: Cranial nerves II through XII are intact. Muscle strength 5/5 in all extremities. Sensation intact. Gait not checked.  PSYCHIATRIC: The patient is alert and oriented x 3.  SKIN: No obvious rash, lesion, or ulcer.   DATA REVIEW:   CBC  Recent Labs Lab 06/12/16 0419  WBC 17.8*  HGB 11.4*  HCT 33.3*  PLT 243    Chemistries   Recent Labs Lab 06/13/16 1420  NA 133*  K 4.7  CL 101  CO2 23  GLUCOSE 196*  BUN 6  CREATININE 0.55  CALCIUM 8.3*    Cardiac Enzymes No results for input(s): TROPONINI in the last 168 hours.  Microbiology Results  Results for orders placed or performed during the hospital encounter of 06/11/16  Urine culture     Status: Abnormal (Preliminary result)   Collection Time: 06/11/16  3:42 PM  Result Value Ref Range Status   Specimen Description URINE, CATHETERIZED  Final   Special Requests Normal  Final   Culture >=100,000 COLONIES/mL GRAM NEGATIVE RODS (A)  Final   Report Status PENDING  Incomplete  Blood culture (routine x 2)     Status: None (Preliminary result)   Collection Time: 06/11/16  4:00 PM  Result Value Ref Range Status   Specimen Description BLOOD LEFT HAND  Final   Special Requests   Final    BOTTLES DRAWN AEROBIC AND ANAEROBIC 5 CC AER, 3 CC ANA   Culture NO GROWTH 2 DAYS  Final   Report Status PENDING  Incomplete  Blood culture (routine x 2)     Status: None (Preliminary result)   Collection Time: 06/11/16  4:10 PM  Result Value Ref Range Status   Specimen Description BLOOD RIGHT  ASSIST CONTROL  Final   Special Requests BOTTLES DRAWN AEROBIC AND ANAEROBIC 5 CC  Final   Culture NO GROWTH 2 DAYS  Final   Report Status PENDING  Incomplete    RADIOLOGY:  Dg Chest 1 View  06/11/2016  CLINICAL DATA:  Fever, dizziness and headache. EXAM: CHEST 1 VIEW COMPARISON:  CT of the chest 01/12/2016  FINDINGS: The heart size and mediastinal contours are within normal limits. Both lungs are clear. The visualized skeletal structures are unremarkable. IMPRESSION: No active disease. Electronically Signed   By: Ted Mcalpine M.D.   On: 06/11/2016 18:14    EKG:   Orders placed or performed during the hospital encounter of 06/11/16  . ED EKG  . ED EKG      Management plans discussed with the patient, family and they are in agreement.  CODE STATUS:     Code Status Orders        Start     Ordered   06/11/16 2051  Full code   Continuous     06/11/16 2050    Code Status History    Date Active Date Inactive Code Status Order ID Comments User Context   02/02/2016 11:07 AM 02/03/2016 12:24 PM Full Code [id number]  Leafy Ro, MD Inpatient   01/12/2016  5:33 PM 01/15/2016  3:36 PM Full Code [id number]  Gale Journey, MD Inpatient      TOTAL TIME TAKING CARE OF THIS PATIENT: 37 minutes.    Luella Gardenhire M.D on 06/13/2016 at 4:35 PM  Between 7am to 6pm - Pager - [phone]  After 6pm go to [email or web address] - password [email or web address]  [facility]  Office  [phone]  CC: Primary care physician; Pcp Not In System

## 2016-06-13 NOTE — Progress Notes (Signed)
Initial Nutrition Assessment  DOCUMENTATION CODES:   Not applicable  INTERVENTION:  -Monitor intake and cater to pt preferences -Discussed carb counting and  Meal planning for better blood glucose control.  Pt verbalized understanding and expect good compliance   NUTRITION DIAGNOSIS:   Inadequate oral intake related to acute illness as evidenced by per patient/family report.    GOAL:   Patient will meet greater than or equal to 90% of their needs    MONITOR:   PO intake, Labs  REASON FOR ASSESSMENT:   Malnutrition Screening Tool    ASSESSMENT:      Pt admitted with fever, headache, dizziness, UTI sepsis  Past Medical History  Diagnosis Date  . Cerebral palsy (HCC)   . Diabetes mellitus without complication (HCC)   . H/O recurrent urinary tract infection   . Psoriasis   . H/O sickle cell trait   . E. coli UTI (urinary tract infection)   . BV (bacterial vaginosis)   . Spina bifida (HCC)   . Myasthenia gravis (HCC)   . GERD (gastroesophageal reflux disease)   . Yeast infection   . Hypokalemia   . Heavy periods   . Trichimoniasis   . Hypertension   . Vaginal discharge   . DUB (dysfunctional uterine bleeding)   . Myasthenia gravis (HCC)   . Chronic kidney disease     Frequent UTI  . Pneumonia January 12, 2016    [facility]  . PONV (postoperative nausea and vomiting)    Pt reports poor po intake for 2 days prior to admission. Eating better this am, ate 100% of breakfast.    Medications reviewed: aspart, metoformin, KCL, NS with KCL at 5675ml/hr Labs reviewed: Na 133, K 2.6, Calcium 7.7, WBC 17.8  Nutrition-Focused physical exam completed. Findings are no fat depletion, no muscle depletion, and no edema.     Diet Order:  Diet Carb Modified Fluid consistency:: Thin; Room service appropriate?: Yes  Skin:  Reviewed, no issues  Last BM:  6/18  Height:   Ht Readings from Last 1 Encounters:  04/21/16 5' (1.524 m)    Weight: pt reports stable wt  Wt  Readings from Last 1 Encounters:  06/11/16 211 lb 6.4 oz (95.89 kg)    Ideal Body Weight:     BMI:  Body mass index is 41.29 kg/(m^2).  Estimated Nutritional Needs:   Kcal:  [id number] kcals/d  Protein:  114-142 g/d  Fluid:  2 L/d  EDUCATION NEEDS:   Education needs addressed  Shogo Larkey B. Freida BusmanAllen, RD, LDN [phone] (pager) Weekend/On-Call pager [phone])

## 2016-06-13 NOTE — Progress Notes (Signed)
[facility] - [place] at [facility]   PATIENT NAME: [name]    MR#:  [id number]  DATE OF BIRTH:  [birth year]  SUBJECTIVE:  CHIEF COMPLAINT:   Chief Complaint  Patient presents with  . Fever  . Headache  . Dizziness   -No further fevers. Potassium remains low. -Blood cultures negative and urine cultures growing gram-negative rods  REVIEW OF SYSTEMS:  Review of Systems  Constitutional: Negative for fever, chills and malaise/fatigue.  HENT: Negative for ear discharge, ear pain and nosebleeds.   Eyes: Negative for blurred vision and double vision.  Respiratory: Negative for cough, shortness of breath and wheezing.   Cardiovascular: Negative for chest pain, palpitations and leg swelling.  Gastrointestinal: Negative for nausea, vomiting, abdominal pain, diarrhea and constipation.  Genitourinary: Positive for frequency. Negative for dysuria and urgency.  Musculoskeletal: Negative for myalgias.  Neurological: Negative for dizziness, sensory change, speech change, focal weakness, seizures and headaches.    DRUG ALLERGIES:   Allergies  Allergen Reactions  . Penicillins Anaphylaxis and Rash    Has patient had a PCN reaction causing immediate rash, facial/tongue/throat swelling, SOB or lightheadedness with hypotension: Yes Has patient had a PCN reaction causing severe rash involving mucus membranes or skin necrosis: No Has patient had a PCN reaction that required hospitalization No Has patient had a PCN reaction occurring within the last 10 years: Yes If all of the above answers are "NO", then may proceed with Cephalosporin use.  . Contrast Media [Iodinated Diagnostic Agents] Itching  . Latex Rash    VITALS:  Blood pressure 120/92, pulse 98, temperature 98.2 F (36.8 C), temperature source Oral, resp. rate 18, weight 95.89 kg (211 lb 6.4 oz), last menstrual period 06/10/2016, SpO2 100 %.  PHYSICAL EXAMINATION:  Physical Exam  GENERAL:  30  y.o.-year-old patient lying in the bed with no acute distress.  EYES: Pupils equal, round, reactive to light and accommodation. No scleral icterus. Extraocular muscles intact.  HEENT: Head atraumatic, normocephalic. Oropharynx and nasopharynx clear.  NECK:  Supple, no jugular venous distention. No thyroid enlargement, no tenderness.  LUNGS: Normal breath sounds bilaterally, no wheezing, rales,rhonchi or crepitation. No use of accessory muscles of respiration.  CARDIOVASCULAR: S1, S2 normal. No murmurs, rubs, or gallops.  ABDOMEN: Soft, nontender, nondistended. Bowel sounds present. No organomegaly or mass.  EXTREMITIES: No pedal edema, cyanosis, or clubbing.  NEUROLOGIC: Cranial nerves II through XII are intact. Muscle strength 5/5 in all extremities. Sensation intact. Gait not checked.  PSYCHIATRIC: The patient is alert and oriented x 3.  SKIN: No obvious rash, lesion, or ulcer.    LABORATORY PANEL:   CBC  Recent Labs Lab 06/12/16 0419  WBC 17.8*  HGB 11.4*  HCT 33.3*  PLT 243   ------------------------------------------------------------------------------------------------------------------  Chemistries   Recent Labs Lab 06/12/16 0419  06/13/16 0648  NA 133*  --   --   K 2.3*  < > 2.6*  CL 97*  --   --   CO2 25  --   --   GLUCOSE 83  --   --   BUN 6  --   --   CREATININE 0.63  --   --   CALCIUM 7.7*  --   --   < > = values in this interval not displayed. ------------------------------------------------------------------------------------------------------------------  Cardiac Enzymes No results for input(s): TROPONINI in the last 168 hours. ------------------------------------------------------------------------------------------------------------------  RADIOLOGY:  Dg Chest 1 View  06/11/2016  CLINICAL DATA:  Fever, dizziness and headache. EXAM: CHEST  1 VIEW COMPARISON:  CT of the chest 01/12/2016 FINDINGS: The heart size and mediastinal contours are within normal  limits. Both lungs are clear. The visualized skeletal structures are unremarkable. IMPRESSION: No active disease. Electronically Signed   By: Ted Mcalpineobrinka  Dimitrova M.D.   On: 06/11/2016 18:14    EKG:   Orders placed or performed during the hospital encounter of 06/11/16  . ED EKG  . ED EKG    ASSESSMENT AND PLAN:   30 year old female with past medical history significant for myasthenia gravis, cerebral palsy, Soriano sepsis, sickle cell trait, hypertension, diabetes, history of cerebral palsy, GERD presents to hospital secondary to sepsis.  #1 sepsis-likely source urinary tract infection -Chest x-ray with no infection -Blood cultures are negative and urine cultures are growing gram negative rods. -Continue Rocephin for now   #2 Persistent hypokalemia-chronic issue. Takes potassium supplements at home and has been running low lately. -Increased outpatient potassium supplements. -Will need outpatient nephrology follow-up. Check urine potassium. Add low-dose Aldactone for now.  #3 neurogenic bladder-continue home medications.  #4 diabetes mellitus-patient on glyburide and metformin -Sliding-scale insulin added  #5 hypertension-continue low-dose lisinopril. Hold chlorthalidone due to hyponatremia Added Aldactone due to persistent hypokalemia  #6 myasthenia-continue prednisone, CellCept and Mestinon. -Outpatient follow-up recommended  #7 DVT prophylaxis-on Lovenox  Possible discharge tomorrow    All the records are reviewed and case discussed with Care Management/Social Workerr. Management plans discussed with the patient, family and they are in agreement.  CODE STATUS: Full code  TOTAL TIME TAKING CARE OF THIS PATIENT: 37 minutes.   POSSIBLE D/C IN 1-2 DAYS, DEPENDING ON CLINICAL CONDITION.   Enid BaasKALISETTI,Zilda No M.D on 06/13/2016 at 2:28 PM  Between 7am to 6pm - Pager - [phone]  After 6pm go to [email or web address] - password [email or web address]  [facility]   Office  [phone]  CC: Primary care physician; Pcp Not In System

## 2016-06-14 LAB — URINE CULTURE: SPECIAL REQUESTS: NORMAL

## 2016-06-16 LAB — CULTURE, BLOOD (ROUTINE X 2)
CULTURE: NO GROWTH
CULTURE: NO GROWTH

## 2016-07-05 ENCOUNTER — Telehealth: Payer: Self-pay | Admitting: Obstetrics and Gynecology

## 2016-07-05 NOTE — Telephone Encounter (Signed)
Pt aware junel makes cycle shorter and lighter. May not even have a cycle. Advised to take same time qd.

## 2016-07-05 NOTE — Telephone Encounter (Signed)
Pt switched pharmacies and they changed her BC from [facility] to [facility], which is probably a generic. She wanted to ask you some questions about it. also she said she is on her menstrual week,and is only spotting and its her 4th day.

## 2016-07-24 ENCOUNTER — Telehealth: Payer: Self-pay | Admitting: Obstetrics and Gynecology

## 2016-07-24 ENCOUNTER — Other Ambulatory Visit: Payer: Self-pay | Admitting: Obstetrics and Gynecology

## 2016-07-24 NOTE — Telephone Encounter (Signed)
A rx should be coming from [facility] for [employer]. This pt said this one worked better for her and to fill this one instead of the one she was on.

## 2016-07-26 ENCOUNTER — Encounter: Payer: Self-pay | Admitting: Obstetrics and Gynecology

## 2016-07-26 NOTE — Telephone Encounter (Signed)
Pt notified that rx sent to pharmacy as requested.

## 2016-09-22 ENCOUNTER — Other Ambulatory Visit: Payer: Self-pay | Admitting: Obstetrics and Gynecology

## 2016-10-25 ENCOUNTER — Other Ambulatory Visit: Payer: Self-pay | Admitting: Obstetrics and Gynecology

## 2016-10-25 NOTE — Telephone Encounter (Signed)
Pt aware med erx. 

## 2016-10-25 NOTE — Telephone Encounter (Signed)
This pt called and said to tell you [facility]

## 2016-10-30 DIAGNOSIS — R253 Fasciculation: Secondary | ICD-10-CM | POA: Insufficient documentation

## 2016-11-29 ENCOUNTER — Emergency Department: Payer: Medicaid Other

## 2016-11-29 ENCOUNTER — Emergency Department
Admission: EM | Admit: 2016-11-29 | Discharge: 2016-11-29 | Disposition: A | Discharge status: Home / Self Care | Payer: Medicaid Other | Attending: Emergency Medicine | Admitting: Emergency Medicine

## 2016-11-29 ENCOUNTER — Encounter: Payer: Self-pay | Admitting: *Deleted

## 2016-11-29 DIAGNOSIS — I129 Hypertensive chronic kidney disease with stage 1 through stage 4 chronic kidney disease, or unspecified chronic kidney disease: Secondary | ICD-10-CM | POA: Diagnosis not present

## 2016-11-29 DIAGNOSIS — N189 Chronic kidney disease, unspecified: Secondary | ICD-10-CM | POA: Insufficient documentation

## 2016-11-29 DIAGNOSIS — E1122 Type 2 diabetes mellitus with diabetic chronic kidney disease: Secondary | ICD-10-CM | POA: Diagnosis not present

## 2016-11-29 DIAGNOSIS — R05 Cough: Secondary | ICD-10-CM | POA: Diagnosis present

## 2016-11-29 DIAGNOSIS — Z7984 Long term (current) use of oral hypoglycemic drugs: Secondary | ICD-10-CM | POA: Diagnosis not present

## 2016-11-29 DIAGNOSIS — B9789 Other viral agents as the cause of diseases classified elsewhere: Secondary | ICD-10-CM

## 2016-11-29 DIAGNOSIS — Z79899 Other long term (current) drug therapy: Secondary | ICD-10-CM | POA: Insufficient documentation

## 2016-11-29 DIAGNOSIS — J069 Acute upper respiratory infection, unspecified: Secondary | ICD-10-CM | POA: Insufficient documentation

## 2016-11-29 MED ORDER — PROMETHAZINE-DM 6.25-15 MG/5ML PO SYRP: 5.0000 mL | ORAL_SOLUTION | Freq: Four times a day (QID) | ORAL | 0 refills | Status: DC | PRN

## 2016-11-29 MED ORDER — FLUTICASONE PROPIONATE 50 MCG/ACT NA SUSP: 2.0000 | Freq: Every day | NASAL | 0 refills | Status: DC

## 2016-11-29 NOTE — ED Provider Notes (Signed)
[facility] Emergency Department Provider Note  ____________________________________________  Time seen: Approximately 1:57 PM  I have reviewed the triage vital signs and the nursing notes.   HISTORY  Chief Complaint Cough    HPI Alphonsa OverallBrigitte Bielicki is a 30 y.o. female , NAD, presents to emergency with 3 day history of cough and chest congestion. Patient states 3 days ago she had nasal congestion, runny nose and some sinus pressure. States those symptoms resolved and over the last 24-hour she's had onset of chest congestion with occasional productive cough. Denies any shortness breath, wheezing nor any chest pain. Has had no abdominal pain, nausea or vomiting. Denies any fevers, chills or body aches. No known sick contacts. Has no further nasal congestion, runny nose, sinus pressure, ear pain nor any ear drainage. Has taken the cough drops over-the-counter and one dose of over-the-counter "sinus medication" without any relief.   Past Medical History:  Diagnosis Date  . BV (bacterial vaginosis)   . Cerebral palsy (HCC)   . Chronic kidney disease    Frequent UTI  . Diabetes mellitus without complication (HCC)   . DUB (dysfunctional uterine bleeding)   . E. coli UTI (urinary tract infection)   . GERD (gastroesophageal reflux disease)   . H/O recurrent urinary tract infection   . H/O sickle cell trait   . Heavy periods   . Hypertension   . Hypokalemia   . Myasthenia gravis (HCC)   . Myasthenia gravis (HCC)   . [facility]  . PONV (postoperative nausea and vomiting)   . Psoriasis   . Spina bifida (HCC)   . Trichimoniasis   . Vaginal discharge   . Yeast infection     Patient Active Problem List   Diagnosis Date Noted  . Cholecystitis 02/02/2016  . Sepsis (HCC) 01/12/2016  . Absolute anemia 08/17/2015  . Hay fever 05/17/2015  . Can't get food down 05/17/2015  . Secondary diabetes mellitus (HCC) 03/15/2015  . Other long term  (current) drug therapy 04/20/2014  . Long term current use of systemic steroids 04/20/2014  . Controlled type 2 diabetes mellitus without complication (HCC) 03/17/2014  . Clinical depression 01/19/2014  . Adenopathy 01/19/2014  . CN (constipation) 08/12/2013  . Spina bifida (HCC) 08/12/2013  . Esophagitis, reflux 10/02/2011  . Avitaminosis D 10/02/2011  . Essential (primary) hypertension 05/15/2011  . Myasthenia gravis (HCC) 12/06/2010    Past Surgical History:  Procedure Laterality Date  . ADENOIDECTOMY    . BACK SURGERY    . CHOLECYSTECTOMY N/A 02/02/2016   Procedure: LAPAROSCOPIC CHOLECYSTECTOMY WITH CHOLANGIOGRAM;  Surgeon: Leafy Roiego F Pabon, MD;  Location: [facility] ORS;  Service: General;  Laterality: N/A;  . DILATION AND CURETTAGE OF UTERUS    . EXC[facility]  . EYE SURGERY Right   . [facility]  . HYSTEROSCOPY W/D&C  2014   benign endometrial polyps  . TONSILLECTOMY      Prior to Admission medications   Medication Sig Start Date End Date Taking? Authorizing Provider  cephALEXin (KEFLEX) 500 MG capsule Take 1 capsule (500 mg total) by mouth 3 (three) times daily. X 5 days 06/13/16   Enid Baasadhika Kalisetti, MD  Cholecalciferol (VITAMIN D3) 50000 units CAPS Take 1 capsule by mouth once a week. 11/25/15   Historical Provider, MD  FLUOCINOLONE ACETONIDE BODY 0.01 % OIL Apply 1 application topically daily as needed. 12/29/15  Historical Provider, MD  fluticasone (FLONASE) 50 MCG/ACT nasal spray Place 2 sprays into both nostrils daily. 11/29/16   Jami L Hagler, PA-C  glyBURIDE (DIABETA) 5 MG tablet Take 10 mg by mouth 2 (two) times daily with a meal. 03/27/16 03/27/17  Historical Provider, MD  HYDROcodone-acetaminophen (NORCO/VICODIN) 5-325 MG tablet Take 1-2 tablets by mouth every 4 (four) hours as needed for moderate pain. 02/03/16   Diego F Pabon, MD  hydrocortisone cream 0.5 % Apply 1 application topically 2 (two) times  daily. Patient taking differently: Apply 1 application topically 2 (two) times daily as needed for itching.  04/21/16   Charmayne Sheer Beers, PA-C  ketoconazole (NIZORAL) 2 % cream Apply daily to flaky areas on face and under right armpit as needed. 11/17/15   Historical Provider, MD  lisinopril (PRINIVIL,ZESTRIL) 5 MG tablet Take 5 mg by mouth daily.    Historical Provider, MD  loratadine (CLARITIN) 10 MG tablet Take 10 mg by mouth daily.    Historical Provider, MD  magic mouthwash SOLN Take 10 mLs by mouth 4 (four) times daily as needed for mouth pain. 02/03/16   Diego F Pabon, MD  metFORMIN (GLUCOPHAGE) 500 MG tablet Take 500 mg by mouth 2 (two) times daily with a meal.    Historical Provider, MD  MICROGESTIN FE 1/20 1-20 MG-MCG tablet TAKE 1 TABLET BY MOUTH EVERY DAY 09/22/16   Herold Harms, MD  MICROGESTIN FE 1/20 1-20 MG-MCG tablet TAKE 1 TABLET BY MOUTH EVERY DAY 10/25/16   Prentice Docker Defrancesco, MD  mirabegron ER (MYRBETRIQ) 25 MG TB24 tablet Take 25 mg by mouth every morning. 04/25/16   Historical Provider, MD  mycophenolate (CELLCEPT) 500 MG tablet Take 1,500 mg by mouth 2 (two) times daily.    Historical Provider, MD  potassium chloride SA (K-DUR,KLOR-CON) 20 MEQ tablet Take 20 mEq by mouth daily.    Historical Provider, MD  predniSONE (DELTASONE) 10 MG tablet Take 10 mg by mouth every other day. Take along with 2.5 mg tablet to equal 12.5 mg total dose.    Historical Provider, MD  predniSONE (DELTASONE) 2.5 MG tablet Take 2.5 mg by mouth every other day. Take along 10 mg tablet to equal 12.5 mg total dose.    Historical Provider, MD  promethazine-dextromethorphan (PROMETHAZINE-DM) 6.25-15 MG/5ML syrup Take 5 mLs by mouth 4 (four) times daily as needed for cough. 11/29/16   Jami L Hagler, PA-C  pyridostigmine (MESTINON) 60 MG tablet Take 60 mg by mouth every 4 (four) hours.    Historical Provider, MD  ranitidine (ZANTAC) 150 MG tablet Take 150 mg by mouth 2 (two) times daily.    Historical  Provider, MD  traMADol (ULTRAM) 50 MG tablet Take 1 tablet (50 mg total) by mouth every 6 (six) hours as needed. 01/27/16 01/26/17  Minna Antis, MD  VESICARE 10 MG tablet Take 1 tablet by mouth daily. 11/25/15   Historical Provider, MD    Allergies Penicillins; Contrast media [iodinated diagnostic agents]; and Latex  Family History  Problem Relation Age of Onset  . Adopted: Yes  . Family history unknown: Yes    Social History Social History  Substance Use Topics  . Smoking status: Never Smoker  . Smokeless tobacco: Never Used  . Alcohol use No     Review of Systems  Constitutional: No fever/chills, Fatigue Eyes: No visual changes. No discharge ENT: No sore throat, Nasal congestion, runny nose, sinus pressure, ear pain, ear drainage. Cardiovascular: No chest pain. Respiratory: Positive productive cough  or chest congestion. No shortness of breath. No wheezing.  Gastrointestinal: No abdominal pain.  No nausea, vomiting.  Musculoskeletal: Negative for general myalgias.  Skin: Negative for rash. Neurological: Negative for headaches. 10-point ROS otherwise negative.  ____________________________________________   PHYSICAL EXAM:  VITAL SIGNS: ED Triage Vitals  Enc Vitals Group     BP 11/29/16 1323 (!) 146/108     Pulse Rate 11/29/16 1323 92     Resp 11/29/16 1323 18     Temp 11/29/16 1323 98.4 F (36.9 C)     Temp Source 11/29/16 1323 Oral     SpO2 11/29/16 1323 99 %     Weight 11/29/16 1324 200 lb (90.7 kg)     Height 11/29/16 1324 5' (1.524 m)     Head Circumference --      Peak Flow --      Pain Score 11/29/16 1328 6     Pain Loc --      Pain Edu? --      Excl. in GC? --      Constitutional: Alert and oriented. Well appearing and in no acute distress. Eyes: Conjunctivae are normal Without icterus or injection Head: Atraumatic. ENT:      Ears: TMs visualized bilaterally without erythema, effusion, bulging, perforation.      Nose: No  congestion/rhinnorhea.      Mouth/Throat: Mucous membranes are moist. Pharynx without erythema, swelling, exudate. Uvula is midline. Airway is patent. Clear postnasal drip. Neck: No stridor. Supple with full range of motion. Hematological/Lymphatic/Immunilogical: No cervical lymphadenopathy. Cardiovascular: Normal rate, regular rhythm. Normal S1 and S2.  Good peripheral circulation. Respiratory: Normal respiratory effort without tachypnea or retractions. Lungs CTAB with breath sounds noted in all lung fields. No wheeze, rhonchi, rales. Neurologic:  Normal speech and language. No gross focal neurologic deficits are appreciated.  Skin:  Skin is warm, dry and intact. No rash noted. Psychiatric: Mood and affect are normal. Speech and behavior are normal. Patient exhibits appropriate insight and judgement.   ____________________________________________   LABS  None ____________________________________________  EKG  None ____________________________________________  RADIOLOGY I, Hope Pigeon, personally viewed and evaluated these images (plain radiographs) as part of my medical decision making, as well as reviewing the written report by the radiologist.  Dg Chest 2 View  Result Date: 11/29/2016 CLINICAL DATA:  Cough, congestion for 3 days. EXAM: CHEST  2 VIEW COMPARISON:  None. FINDINGS: Heart and mediastinal contours are within normal limits. No focal opacities or effusions. No acute bony abnormality. IMPRESSION: No active cardiopulmonary disease. Electronically Signed   By: Charlett Nose M.D.   On: 11/29/2016 14:44    ____________________________________________    PROCEDURES  Procedure(s) performed: None   Procedures   Medications - No data to display   ____________________________________________   INITIAL IMPRESSION / ASSESSMENT AND PLAN / ED COURSE  Pertinent labs & imaging results that were available during my care of the patient were reviewed by me and considered  in my medical decision making (see chart for details).  Clinical Course     Patient's diagnosis is consistent with Viral URI with cough. Patient will be discharged home with prescriptions for promethazine DM and Flonase to take as distracted. Patient is to follow up with her primary care provider or [facility] if symptoms persist past this treatment course. Patient is given ED precautions to return to the ED for any worsening or new symptoms.    ____________________________________________  FINAL CLINICAL IMPRESSION(S) / ED DIAGNOSES  Final diagnoses:  Viral URI with cough      NEW MEDICATIONS STARTED DURING THIS VISIT:  Discharge Medication List as of 11/29/2016  2:56 PM    START taking these medications   Details  fluticasone (FLONASE) 50 MCG/ACT nasal spray Place 2 sprays into both nostrils daily., Starting Wed 11/29/2016, Print    promethazine-dextromethorphan (PROMETHAZINE-DM) 6.25-15 MG/5ML syrup Take 5 mLs by mouth 4 (four) times daily as needed for cough., Starting Wed 11/29/2016, Print             Ernestene KielJami L Johnson CityHagler, PA-C 11/29/16 1511    Myrna Blazeravid Matthew Schaevitz, MD 11/29/16 [phone]

## 2016-11-29 NOTE — ED Triage Notes (Signed)
Patient c/o productive for 2-3 days with yellow mucous. Patient has a hoarse voice in triage.

## 2016-12-08 NOTE — Progress Notes (Signed)
Patient ID: [name], female   DOB: 24-Mar-1986, 30 y.o.   MRN: [id number] ANNUAL PREVENTATIVE CARE GYN  ENCOUNTER NOTE  Subjective:       [name] is a 30 y.o. G0P0000 female here for a routine annual gynecologic exam.  Current complaints: 1. No gynecologic complaints.     Gynecologic History Patient's last menstrual period was 11/22/2016. Contraception: OCP (estrogen/progesterone) Last Pap: 12/07/2015 neg. Results were: normal Last mammogram: n/a. Results were: normal  Obstetric History OB History  Gravida Para Term Preterm AB Living  0 0 0 0 0 0  SAB TAB Ectopic Multiple Live Births  0 0 0 0          Past Medical History:  Diagnosis Date  . BV (bacterial vaginosis)   . Cerebral palsy (HCC)   . Chronic kidney disease    Frequent UTI  . Diabetes mellitus without complication (HCC)   . DUB (dysfunctional uterine bleeding)   . E. coli UTI (urinary tract infection)   . GERD (gastroesophageal reflux disease)   . H/O recurrent urinary tract infection   . H/O sickle cell trait   . Heavy periods   . Hypertension   . Hypokalemia   . Myasthenia gravis (HCC)   . Myasthenia gravis (HCC)   . Pneumonia January 12, 2016   [facility]  . PONV (postoperative nausea and vomiting)   . Psoriasis   . Spina bifida (HCC)   . Trichimoniasis   . Vaginal discharge   . Yeast infection     Past Surgical History:  Procedure Laterality Date  . ADENOIDECTOMY    . BACK SURGERY    . CHOLECYSTECTOMY N/A 02/02/2016   Procedure: LAPAROSCOPIC CHOLECYSTECTOMY WITH CHOLANGIOGRAM;  Surgeon: Leafy Ro, MD;  Location: [facility] ORS;  Service: General;  Laterality: N/A;  . DILATION AND CURETTAGE OF UTERUS    . EXCISION NECK NODULE Left December 09, 2015   [facility]  . EYE SURGERY Right   . FOOT SURGERY Bilateral    [facility]  . HYSTEROSCOPY W/D&C  2014   benign endometrial polyps  . TONSILLECTOMY      Current Outpatient Prescriptions on File Prior to Visit  Medication Sig  Dispense Refill  . cephALEXin (KEFLEX) 500 MG capsule Take 1 capsule (500 mg total) by mouth 3 (three) times daily. X 5 days 5 capsule 0  . Cholecalciferol (VITAMIN D3) 50000 units CAPS Take 1 capsule by mouth once a week.  1  . FLUOCINOLONE ACETONIDE BODY 0.01 % OIL Apply 1 application topically daily as needed.    . fluticasone (FLONASE) 50 MCG/ACT nasal spray Place 2 sprays into both nostrils daily. 16 g 0  . glyBURIDE (DIABETA) 5 MG tablet Take 10 mg by mouth 2 (two) times daily with a meal.    . HYDROcodone-acetaminophen (NORCO/VICODIN) 5-325 MG tablet Take 1-2 tablets by mouth every 4 (four) hours as needed for moderate pain. 30 tablet 0  . hydrocortisone cream 0.5 % Apply 1 application topically 2 (two) times daily. (Patient taking differently: Apply 1 application topically 2 (two) times daily as needed for itching. ) 30 g 0  . ketoconazole (NIZORAL) 2 % cream Apply daily to flaky areas on face and under right armpit as needed.    Marland Kitchen lisinopril (PRINIVIL,ZESTRIL) 5 MG tablet Take 5 mg by mouth daily.    Marland Kitchen loratadine (CLARITIN) 10 MG tablet Take 10 mg by mouth daily.    . magic mouthwash SOLN Take 10 mLs by  mouth 4 (four) times daily as needed for mouth pain. 300 mL 0  . metFORMIN (GLUCOPHAGE) 500 MG tablet Take 500 mg by mouth 2 (two) times daily with a meal.    . MICROGESTIN FE 1/20 1-20 MG-MCG tablet TAKE 1 TABLET BY MOUTH EVERY DAY 84 tablet 1  . MICROGESTIN FE 1/20 1-20 MG-MCG tablet TAKE 1 TABLET BY MOUTH EVERY DAY 84 tablet 0  . mirabegron ER (MYRBETRIQ) 25 MG TB24 tablet Take 25 mg by mouth every morning.    . mycophenolate (CELLCEPT) 500 MG tablet Take 1,500 mg by mouth 2 (two) times daily.    . potassium chloride SA (K-DUR,KLOR-CON) 20 MEQ tablet Take 20 mEq by mouth daily.    . predniSONE (DELTASONE) 10 MG tablet Take 10 mg by mouth every other day. Take along with 2.5 mg tablet to equal 12.5 mg total dose.    . predniSONE (DELTASONE) 2.5 MG tablet Take 2.5 mg by mouth every other  day. Take along 10 mg tablet to equal 12.5 mg total dose.    . promethazine-dextromethorphan (PROMETHAZINE-DM) 6.25-15 MG/5ML syrup Take 5 mLs by mouth 4 (four) times daily as needed for cough. 118 mL 0  . pyridostigmine (MESTINON) 60 MG tablet Take 60 mg by mouth every 4 (four) hours.    . ranitidine (ZANTAC) 150 MG tablet Take 150 mg by mouth 2 (two) times daily.    . traMADol (ULTRAM) 50 MG tablet Take 1 tablet (50 mg total) by mouth every 6 (six) hours as needed. 20 tablet 0  . VESICARE 10 MG tablet Take 1 tablet by mouth daily.  0   No current facility-administered medications on file prior to visit.     Allergies  Allergen Reactions  . Penicillins Anaphylaxis and Rash    Has patient had a PCN reaction causing immediate rash, facial/tongue/throat swelling, SOB or lightheadedness with hypotension: Yes Has patient had a PCN reaction causing severe rash involving mucus membranes or skin necrosis: No Has patient had a PCN reaction that required hospitalization No Has patient had a PCN reaction occurring within the last 10 years: Yes If all of the above answers are "NO", then may proceed with Cephalosporin use.  . Contrast Media [Iodinated Diagnostic Agents] Itching  . Latex Rash    Social History   Social History  . Marital status: Married    Spouse name: N/A  . Number of children: N/A  . Years of education: N/A   Occupational History  . Not on file.   Social History Main Topics  . Smoking status: Never Smoker  . Smokeless tobacco: Never Used  . Alcohol use No  . Drug use: No  . Sexual activity: Yes    Birth control/ protection: Pill   Other Topics Concern  . Not on file   Social History Narrative  . No narrative on file    Family History  Problem Relation Age of Onset  . Adopted: Yes  . Family history unknown: Yes    The following portions of the patient's history were reviewed and updated as appropriate: allergies, current medications, past family history,  past medical history, past social history, past surgical history and problem list.  Review of Systems ROS Review of Systems - General ROS: negative for - chills,  fever, hot flashes, night sweats, weight gain or weight loss.  POSITIVE-fatigue Psychological ROS: negative for - anxiety, decreased libido, depression, mood swings, physical abuse or sexual abuse Ophthalmic ROS: negative for - blurry vision, eye pain or  loss of vision ENT ROS: negative for - headaches, hearing change, visual changes or vocal changes Allergy and Immunology ROS: negative for - hives, itchy/watery eyes or seasonal allergies Hematological and Lymphatic ROS: negative for - bleeding problems, bruising, swollen lymph nodes or weight loss Endocrine ROS: negative for - galactorrhea, hair pattern changes, hot flashes, malaise/lethargy, mood swings, palpitations, polydipsia/polyuria, skin changes, temperature intolerance or unexpected weight changes Breast ROS: negative for - new or changing breast lumps or nipple discharge Respiratory ROS: negative for - cough or shortness of breath Cardiovascular ROS: negative for - chest pain, irregular heartbeat, palpitations or shortness of breath Gastrointestinal ROS: no abdominal pain, change in bowel habits, or black or bloody stools Genito-Urinary ROS: no dysuria, trouble voiding, or hematuria Musculoskeletal ROS: negative for - joint pain or joint stiffness Neurological ROS: negative for - bowel and bladder control changes Dermatological ROS: negative for rash and skin lesion changes   Objective:   LMP 11/22/2016  BP 129/83   Pulse 83   Ht 5' (1.524 m)   Wt 206 lb 6.4 oz (93.6 kg)   LMP 11/22/2016   BMI 40.31 kg/m   CONSTITUTIONAL: Well-developed, well-nourished female in no acute distress.  PSYCHIATRIC: Normal mood and affect. Normal behavior. Normal judgment and thought content. NEUROLGIC: Alert and oriented to person, place, and time. Normal muscle tone coordination. No  cranial nerve deficit noted. HENT:  Normocephalic, atraumatic, External right and left ear normal. Oropharynx is clear and moist EYES: Conjunctivae and EOM are normal. Pupils are equal, round, and reactive to light. No scleral icterus.  NECK: Normal range of motion, supple, no masses.  Normal thyroid. Left side exterior auricular lymph node visible and palpable 1.5 cm, nontender SKIN: Skin is warm and dry. No rash noted. Not diaphoretic. No erythema. No pallor. CARDIOVASCULAR: Normal heart rate noted, regular rhythm, no murmur. RESPIRATORY: Clear to auscultation bilaterally. Effort and breath sounds normal, no problems with respiration noted. BREASTS: Symmetric in size. No masses, skin changes, nipple drainage, or lymphadenopathy. ABDOMEN: Soft, normal bowel sounds, no distention noted.  No tenderness, rebound or guarding.  BLADDER: Normal PELVIC:  External Genitalia: Normal  BUS: Normal  Vagina: Normal  Cervix: Normal  Uterus: Normal; Midplane, normal size and shape, nontender, Mobile  Adnexa: Normal; Nonpalpable; nontender  RV: External Exam NormaI  MUSCULOSKELETAL: Normal range of motion. No tenderness.  No cyanosis, clubbing, or edema.  2+ distal pulses. LYMPHATIC: No Axillary, Supraclavicular, or Inguinal Adenopathy. 1.5 cm left post auricular lymph node palpable, nontender    Assessment:   Annual gynecologic examination 30 y.o. Contraception: OCP (estrogen/progesterone) bmi- 39  Plan:  Pap: pap w/hpv Mammogram: Not Indicated Stool Guaiac Testing:  Not Indicated Labs: thur pcp Routine preventative health maintenance measures emphasized: Exercise/Diet/Weight control, Tobacco Warnings, Alcohol/Substance use risks and Safe Sex  Birth control pills are refilled Return to Clinic - 1 Year   Alyssah Algeo CenterMiller, CMA  Herold HarmsMartin A Defrancesco, MD  Note: This dictation was prepared with Dragon dictation along with smaller phrase technology. Any transcriptional errors that result from  this process are unintentional.

## 2016-12-12 ENCOUNTER — Ambulatory Visit (INDEPENDENT_AMBULATORY_CARE_PROVIDER_SITE_OTHER): Payer: Medicaid Other | Admitting: Obstetrics and Gynecology

## 2016-12-12 VITALS — BP 129/83 | HR 83 | Ht 60.0 in | Wt 206.4 lb

## 2016-12-12 DIAGNOSIS — G809 Cerebral palsy, unspecified: Secondary | ICD-10-CM

## 2016-12-12 DIAGNOSIS — E669 Obesity, unspecified: Secondary | ICD-10-CM | POA: Insufficient documentation

## 2016-12-12 DIAGNOSIS — Q059 Spina bifida, unspecified: Secondary | ICD-10-CM

## 2016-12-12 DIAGNOSIS — Z0000 Encounter for general adult medical examination without abnormal findings: Secondary | ICD-10-CM | POA: Diagnosis not present

## 2016-12-12 DIAGNOSIS — Z3041 Encounter for surveillance of contraceptive pills: Secondary | ICD-10-CM

## 2016-12-12 DIAGNOSIS — Z01419 Encounter for gynecological examination (general) (routine) without abnormal findings: Secondary | ICD-10-CM

## 2016-12-12 MED ORDER — NORETHIN ACE-ETH ESTRAD-FE 1-20 MG-MCG PO TABS: 1.0000 | ORAL_TABLET | Freq: Every day | ORAL | 3 refills | Status: DC

## 2016-12-12 NOTE — Patient Instructions (Signed)
1. Pap smear is done 2. Continue with breast surveillance 3. Continue with healthy eating and  exercise 4.Screening labs to be done through primary care 5. Birth control pills are refilled   6.Return in 1 year   Health Maintenance, Female Introduction Adopting a healthy lifestyle and getting preventive care can go a long way to promote health and wellness. Talk with your health care provider about what schedule of regular examinations is right for you. This is a good chance for you to check in with your provider about disease prevention and staying healthy. In between checkups, there are plenty of things you can do on your own. Experts have done a lot of research about which lifestyle changes and preventive measures are most likely to keep you healthy. Ask your health care provider for more information. Weight and diet Eat a healthy diet  Be sure to include plenty of vegetables, fruits, low-fat dairy products, and lean protein.  Do not eat a lot of foods high in solid fats, added sugars, or salt.  Get regular exercise. This is one of the most important things you can do for your health.  Most adults should exercise for at least 150 minutes each week. The exercise should increase your heart rate and make you sweat (moderate-intensity exercise).  Most adults should also do strengthening exercises at least twice a week. This is in addition to the moderate-intensity exercise. Maintain a healthy weight  Body mass index (BMI) is a measurement that can be used to identify possible weight problems. It estimates body fat based on height and weight. Your health care provider can help determine your BMI and help you achieve or maintain a healthy weight.  For females 65 years of age and older:  A BMI below 18.5 is considered underweight.  A BMI of 18.5 to 24.9 is normal.  A BMI of 25 to 29.9 is considered overweight.  A BMI of 30 and above is considered obese. Watch levels of cholesterol and  blood lipids  You should start having your blood tested for lipids and cholesterol at 30 years of age, then have this test every 5 years.  You may need to have your cholesterol levels checked more often if:  Your lipid or cholesterol levels are high.  You are older than 30 years of age.  You are at high risk for heart disease. Cancer screening Lung Cancer  Lung cancer screening is recommended for adults 9-40 years old who are at high risk for lung cancer because of a history of smoking.  A yearly low-dose CT scan of the lungs is recommended for people who:  Currently smoke.  Have quit within the past 15 years.  Have at least a 30-pack-year history of smoking. A pack year is smoking an average of one pack of cigarettes a day for 1 year.  Yearly screening should continue until it has been 15 years since you quit.  Yearly screening should stop if you develop a health problem that would prevent you from having lung cancer treatment. Breast Cancer  Practice breast self-awareness. This means understanding how your breasts normally appear and feel.  It also means doing regular breast self-exams. Let your health care provider know about any changes, no matter how small.  If you are in your 20s or 30s, you should have a clinical breast exam (CBE) by a health care provider every 1-3 years as part of a regular health exam.  If you are 53 or older, have a CBE  every year. Also consider having a breast X-ray (mammogram) every year.  If you have a family history of breast cancer, talk to your health care provider about genetic screening.  If you are at high risk for breast cancer, talk to your health care provider about having an MRI and a mammogram every year.  Breast cancer gene (BRCA) assessment is recommended for women who have family members with BRCA-related cancers. BRCA-related cancers include:  Breast.  Ovarian.  Tubal.  Peritoneal cancers.  Results of the assessment  will determine the need for genetic counseling and BRCA1 and BRCA2 testing. Cervical Cancer  Your health care provider may recommend that you be screened regularly for cancer of the pelvic organs (ovaries, uterus, and vagina). This screening involves a pelvic examination, including checking for microscopic changes to the surface of your cervix (Pap test). You may be encouraged to have this screening done every 3 years, beginning at age 80.  For women ages 59-65, health care providers may recommend pelvic exams and Pap testing every 3 years, or they may recommend the Pap and pelvic exam, combined with testing for human papilloma virus (HPV), every 5 years. Some types of HPV increase your risk of cervical cancer. Testing for HPV may also be done on women of any age with unclear Pap test results.  Other health care providers may not recommend any screening for nonpregnant women who are considered low risk for pelvic cancer and who do not have symptoms. Ask your health care provider if a screening pelvic exam is right for you.  If you have had past treatment for cervical cancer or a condition that could lead to cancer, you need Pap tests and screening for cancer for at least 20 years after your treatment. If Pap tests have been discontinued, your risk factors (such as having a new sexual partner) need to be reassessed to determine if screening should resume. Some women have medical problems that increase the chance of getting cervical cancer. In these cases, your health care provider may recommend more frequent screening and Pap tests. Colorectal Cancer  This type of cancer can be detected and often prevented.  Routine colorectal cancer screening usually begins at 30 years of age and continues through 30 years of age.  Your health care provider may recommend screening at an earlier age if you have risk factors for colon cancer.  Your health care provider may also recommend using home test kits to check  for hidden blood in the stool.  A small camera at the end of a tube can be used to examine your colon directly (sigmoidoscopy or colonoscopy). This is done to check for the earliest forms of colorectal cancer.  Routine screening usually begins at age 67.  Direct examination of the colon should be repeated every 5-10 years through 30 years of age. However, you may need to be screened more often if early forms of precancerous polyps or small growths are found. Skin Cancer  Check your skin from head to toe regularly.  Tell your health care provider about any new moles or changes in moles, especially if there is a change in a mole's shape or color.  Also tell your health care provider if you have a mole that is larger than the size of a pencil eraser.  Always use sunscreen. Apply sunscreen liberally and repeatedly throughout the day.  Protect yourself by wearing long sleeves, pants, a wide-brimmed hat, and sunglasses whenever you are outside. Heart disease, diabetes, and high blood  pressure  High blood pressure causes heart disease and increases the risk of stroke. High blood pressure is more likely to develop in:  People who have blood pressure in the high end of the normal range (130-139/85-89 mm Hg).  People who are overweight or obese.  People who are African American.  If you are 66-62 years of age, have your blood pressure checked every 3-5 years. If you are 58 years of age or older, have your blood pressure checked every year. You should have your blood pressure measured twice-once when you are at a hospital or clinic, and once when you are not at a hospital or clinic. Record the average of the two measurements. To check your blood pressure when you are not at a hospital or clinic, you can use:  An automated blood pressure machine at a pharmacy.  A home blood pressure monitor.  If you are between 20 years and 52 years old, ask your health care provider if you should take aspirin  to prevent strokes.  Have regular diabetes screenings. This involves taking a blood sample to check your fasting blood sugar level.  If you are at a normal weight and have a low risk for diabetes, have this test once every three years after 31 years of age.  If you are overweight and have a high risk for diabetes, consider being tested at a younger age or more often. Preventing infection Hepatitis B  If you have a higher risk for hepatitis B, you should be screened for this virus. You are considered at high risk for hepatitis B if:  You were born in a country where hepatitis B is common. Ask your health care provider which countries are considered high risk.  Your parents were born in a high-risk country, and you have not been immunized against hepatitis B (hepatitis B vaccine).  You have HIV or AIDS.  You use needles to inject street drugs.  You live with someone who has hepatitis B.  You have had sex with someone who has hepatitis B.  You get hemodialysis treatment.  You take certain medicines for conditions, including cancer, organ transplantation, and autoimmune conditions. Hepatitis C  Blood testing is recommended for:  Everyone born from [birth year] through 1965.  Anyone with known risk factors for hepatitis C. Sexually transmitted infections (STIs)  You should be screened for sexually transmitted infections (STIs) including gonorrhea and chlamydia if:  You are sexually active and are younger than 30 years of age.  You are older than 30 years of age and your health care provider tells you that you are at risk for this type of infection.  Your sexual activity has changed since you were last screened and you are at an increased risk for chlamydia or gonorrhea. Ask your health care provider if you are at risk.  If you do not have HIV, but are at risk, it may be recommended that you take a prescription medicine daily to prevent HIV infection. This is called pre-exposure  prophylaxis (PrEP). You are considered at risk if:  You are sexually active and do not regularly use condoms or know the HIV status of your partner(s).  You take drugs by injection.  You are sexually active with a partner who has HIV. Talk with your health care provider about whether you are at high risk of being infected with HIV. If you choose to begin PrEP, you should first be tested for HIV. You should then be tested every 3 months for  as long as you are taking PrEP. Pregnancy  If you are premenopausal and you may become pregnant, ask your health care provider about preconception counseling.  If you may become pregnant, take 400 to 800 micrograms (mcg) of folic acid every day.  If you want to prevent pregnancy, talk to your health care provider about birth control (contraception). Osteoporosis and menopause  Osteoporosis is a disease in which the bones lose minerals and strength with aging. This can result in serious bone fractures. Your risk for osteoporosis can be identified using a bone density scan.  If you are 29 years of age or older, or if you are at risk for osteoporosis and fractures, ask your health care provider if you should be screened.  Ask your health care provider whether you should take a calcium or vitamin D supplement to lower your risk for osteoporosis.  Menopause may have certain physical symptoms and risks.  Hormone replacement therapy may reduce some of these symptoms and risks. Talk to your health care provider about whether hormone replacement therapy is right for you. Follow these instructions at home:  Schedule regular health, dental, and eye exams.  Stay current with your immunizations.  Do not use any tobacco products including cigarettes, chewing tobacco, or electronic cigarettes.  If you are pregnant, do not drink alcohol.  If you are breastfeeding, limit how much and how often you drink alcohol.  Limit alcohol intake to no more than 1 drink  per day for nonpregnant women. One drink equals 12 ounces of beer, 5 ounces of wine, or 1 ounces of hard liquor.  Do not use street drugs.  Do not share needles.  Ask your health care provider for help if you need support or information about quitting drugs.  Tell your health care provider if you often feel depressed.  Tell your health care provider if you have ever been abused or do not feel safe at home. This information is not intended to replace advice given to you by your health care provider. Make sure you discuss any questions you have with your health care provider. Document Released: 06/26/2011 Document Revised: 05/18/2016 Document Reviewed: 09/14/2015  2017 Elsevier

## 2016-12-14 LAB — PAP IG AND HPV HIGH-RISK
HPV, HIGH-RISK: NEGATIVE
PAP Smear Comment: 0

## 2016-12-27 DIAGNOSIS — K592 Neurogenic bowel, not elsewhere classified: Secondary | ICD-10-CM | POA: Insufficient documentation

## 2016-12-27 DIAGNOSIS — N319 Neuromuscular dysfunction of bladder, unspecified: Secondary | ICD-10-CM | POA: Insufficient documentation

## 2017-02-04 ENCOUNTER — Encounter: Payer: Self-pay | Admitting: Emergency Medicine

## 2017-02-04 ENCOUNTER — Emergency Department
Admission: EM | Admit: 2017-02-04 | Discharge: 2017-02-04 | Disposition: A | Discharge status: Home / Self Care | Payer: Medicaid Other | Attending: Emergency Medicine | Admitting: Emergency Medicine

## 2017-02-04 DIAGNOSIS — Z91040 Latex allergy status: Secondary | ICD-10-CM | POA: Insufficient documentation

## 2017-02-04 DIAGNOSIS — H7291 Unspecified perforation of tympanic membrane, right ear: Secondary | ICD-10-CM | POA: Diagnosis not present

## 2017-02-04 DIAGNOSIS — H578 Other specified disorders of eye and adnexa: Secondary | ICD-10-CM | POA: Insufficient documentation

## 2017-02-04 DIAGNOSIS — Z79899 Other long term (current) drug therapy: Secondary | ICD-10-CM | POA: Insufficient documentation

## 2017-02-04 DIAGNOSIS — G809 Cerebral palsy, unspecified: Secondary | ICD-10-CM | POA: Diagnosis not present

## 2017-02-04 DIAGNOSIS — N189 Chronic kidney disease, unspecified: Secondary | ICD-10-CM | POA: Diagnosis not present

## 2017-02-04 DIAGNOSIS — I129 Hypertensive chronic kidney disease with stage 1 through stage 4 chronic kidney disease, or unspecified chronic kidney disease: Secondary | ICD-10-CM | POA: Diagnosis not present

## 2017-02-04 DIAGNOSIS — E1122 Type 2 diabetes mellitus with diabetic chronic kidney disease: Secondary | ICD-10-CM | POA: Diagnosis not present

## 2017-02-04 DIAGNOSIS — Z7984 Long term (current) use of oral hypoglycemic drugs: Secondary | ICD-10-CM | POA: Insufficient documentation

## 2017-02-04 DIAGNOSIS — H9201 Otalgia, right ear: Secondary | ICD-10-CM | POA: Diagnosis present

## 2017-02-04 MED ORDER — TETRACAINE HCL 0.5 % OP SOLN
1.0000 [drp] | Freq: Once | OPHTHALMIC | Status: AC
  Administered 2017-02-04: 1 [drp] via OPHTHALMIC
  Filled 2017-02-04: qty 2

## 2017-02-04 MED ORDER — FLUORESCEIN SODIUM 0.6 MG OP STRP
1.0000 | ORAL_STRIP | Freq: Once | OPHTHALMIC | Status: AC
  Administered 2017-02-04: 1 via OPHTHALMIC
  Filled 2017-02-04: qty 1

## 2017-02-04 MED ORDER — TETRACAINE HCL 0.5 % OP SOLN
OPHTHALMIC | Status: AC
  Administered 2017-02-04: 1 [drp] via OPHTHALMIC
  Filled 2017-02-04: qty 2

## 2017-02-04 MED ORDER — OFLOXACIN 0.3 % OT SOLN: 5.0000 [drp] | Freq: Every day | OTIC | 0 refills | Status: DC

## 2017-02-04 MED ORDER — FLUORESCEIN SODIUM 0.6 MG OP STRP
ORAL_STRIP | OPHTHALMIC | Status: AC
  Administered 2017-02-04: 1 via OPHTHALMIC
  Filled 2017-02-04: qty 1
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
## 2017-02-04 NOTE — Discharge Instructions (Signed)
Please seek medical attention for any high fevers, chest pain, shortness of breath, change in behavior, persistent vomiting, bloody stool or any other new or concerning symptoms.  

## 2017-02-04 NOTE — ED Notes (Signed)
Pt alert and oriented X4, active, cooperative, pt in NAD. RR even and unlabored, color WNL.  Pt informed to return if any life threatening symptoms occur.   

## 2017-02-04 NOTE — ED Notes (Signed)
AAOx3.  Skin warm and dry. NAD.  Moving all extremities equally and strong. 
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
## 2017-02-04 NOTE — ED Notes (Signed)
Patient denies pain and is resting comfortably.  

## 2017-03-11 ENCOUNTER — Emergency Department
Admission: EM | Admit: 2017-03-11 | Discharge: 2017-03-11 | Disposition: A | Discharge status: Home / Self Care | Payer: Medicaid Other | Attending: Emergency Medicine | Admitting: Emergency Medicine

## 2017-03-11 ENCOUNTER — Encounter: Payer: Self-pay | Admitting: Emergency Medicine

## 2017-03-11 DIAGNOSIS — Z91040 Latex allergy status: Secondary | ICD-10-CM | POA: Insufficient documentation

## 2017-03-11 DIAGNOSIS — I129 Hypertensive chronic kidney disease with stage 1 through stage 4 chronic kidney disease, or unspecified chronic kidney disease: Secondary | ICD-10-CM | POA: Insufficient documentation

## 2017-03-11 DIAGNOSIS — Z7984 Long term (current) use of oral hypoglycemic drugs: Secondary | ICD-10-CM | POA: Insufficient documentation

## 2017-03-11 DIAGNOSIS — T83511A Infection and inflammatory reaction due to indwelling urethral catheter, initial encounter: Secondary | ICD-10-CM | POA: Insufficient documentation

## 2017-03-11 DIAGNOSIS — R8299 Other abnormal findings in urine: Secondary | ICD-10-CM | POA: Diagnosis present

## 2017-03-11 DIAGNOSIS — N189 Chronic kidney disease, unspecified: Secondary | ICD-10-CM | POA: Insufficient documentation

## 2017-03-11 DIAGNOSIS — Z79899 Other long term (current) drug therapy: Secondary | ICD-10-CM | POA: Insufficient documentation

## 2017-03-11 DIAGNOSIS — Y829 Unspecified medical devices associated with adverse incidents: Secondary | ICD-10-CM | POA: Insufficient documentation

## 2017-03-11 DIAGNOSIS — E1122 Type 2 diabetes mellitus with diabetic chronic kidney disease: Secondary | ICD-10-CM | POA: Diagnosis not present

## 2017-03-11 DIAGNOSIS — N390 Urinary tract infection, site not specified: Secondary | ICD-10-CM

## 2017-03-11 LAB — BASIC METABOLIC PANEL
ANION GAP: 6 (ref 5–15)
BUN: 8 mg/dL (ref 6–20)
CHLORIDE: 101 mmol/L (ref 101–111)
CO2: 25 mmol/L (ref 22–32)
Calcium: 8.7 mg/dL — ABNORMAL LOW (ref 8.9–10.3)
Creatinine, Ser: 0.83 mg/dL (ref 0.44–1.00)
GFR calc Af Amer: >60 mL/min (ref >60)
GFR calc non Af Amer: >60 mL/min (ref >60)
GLUCOSE: 162 mg/dL — AB (ref 65–99)
POTASSIUM: 3.8 mmol/L (ref 3.5–5.1)
Sodium: 132 mmol/L — ABNORMAL LOW (ref 135–145)

## 2017-03-11 LAB — URINALYSIS, COMPLETE (UACMP) WITH MICROSCOPIC
Bilirubin Urine: NEGATIVE
Glucose, UA: NEGATIVE mg/dL
Ketones, ur: NEGATIVE mg/dL
Nitrite: POSITIVE — AB
PH: 5 (ref 5.0–8.0)
PROTEIN: 100 mg/dL — AB
SPECIFIC GRAVITY, URINE: 1.021 (ref 1.005–1.030)

## 2017-03-11 LAB — CBC
HEMATOCRIT: 34.8 % — AB (ref 35.0–47.0)
HEMOGLOBIN: 11.9 g/dL — AB (ref 12.0–16.0)
MCH: 26.7 pg (ref 26.0–34.0)
MCHC: 34.1 g/dL (ref 32.0–36.0)
MCV: 78.4 fL — AB (ref 80.0–100.0)
Platelets: 277 10*3/uL (ref 150–440)
RBC: 4.45 MIL/uL (ref 3.80–5.20)
RDW: 13.4 % (ref 11.5–14.5)
WBC: 12.9 10*3/uL — ABNORMAL HIGH (ref 3.6–11.0)

## 2017-03-11 LAB — POCT PREGNANCY, URINE
PREG TEST UR: NEGATIVE
Preg Test, Ur: NEGATIVE

## 2017-03-11 MED ORDER — LEVOFLOXACIN IN D5W 750 MG/150ML IV SOLN
750.0000 mg | Freq: Once | INTRAVENOUS | Status: AC
  Administered 2017-03-11: 750 mg via INTRAVENOUS
  Filled 2017-03-11: qty 150

## 2017-03-11 MED ORDER — LEVOFLOXACIN 750 MG PO TABS: 750.0000 mg | ORAL_TABLET | Freq: Every day | ORAL | 0 refills | Status: AC

## 2017-03-11 NOTE — ED Triage Notes (Signed)
Pt presents to ED via POV. Pt states hx of spina bifida and a hx of self catheters at home. Pt states since Friday, has had discoloration of her urine, states it is an orange color. Pt states that she had had fevers, reports 100.9 this morning, denies taking any antipyretics today. Pt also c/o HA, alert and oriented at this time. P also c/o decreased appetite and that she feels dehydration, pt states she is able to tolerate PO fluids at this time. Pt states she is on a permanent abx, Macrobid, for recurrent bladder infections, however she does not take it due to medication regularly due to requiring taking medication with food and pt states she is taking mycophenolate for myethenia gravis and is not able to eat 3 hrs before or after taking the medication.

## 2017-03-11 NOTE — ED Provider Notes (Signed)
[facility] Emergency Department Provider Note  ____________________________________________   I have reviewed the triage vital signs and the nursing notes.   HISTORY  Chief Complaint Poss Infection    HPI [name] is a 31 y.o. female who has spina bifida and self cathetered. Patient states that she has had UTIs but not since he was last here. She states that she has had discolored urine, she states that she has been eating and drinking okay. Since Friday however she has been having discolored and infected appearing urine and she's had a low-grade fever since Friday as well. Today is Sunday. Today her MAXIMUM TEMPERATURE was 100.9 and she states.  He denies significant headache or rash. She denies flank pain or side pain.   Past Medical History:  Diagnosis Date  . BV (bacterial vaginosis)   . Chronic kidney disease    Frequent UTI  . Diabetes mellitus without complication (HCC)   . DUB (dysfunctional uterine bleeding)   . E. coli UTI (urinary tract infection)   . GERD (gastroesophageal reflux disease)   . H/O recurrent urinary tract infection   . H/O sickle cell trait   . Heavy periods   . Hypertension   . Hypokalemia   . Myasthenia gravis (HCC)   . Myasthenia gravis (HCC)   . Pneumonia January 12, 2016   [facility]  . PONV (postoperative nausea and vomiting)   . Psoriasis   . Spina bifida (HCC)   . Trichimoniasis   . Vaginal discharge   . Yeast infection     Patient Active Problem List   Diagnosis Date Noted  . Cerebral palsy (HCC) 12/12/2016  . Class 2 obesity 12/12/2016  . Encounter for surveillance of contraceptive pills 12/12/2016  . Cholecystitis 02/02/2016  . Sepsis (HCC) 01/12/2016  . Absolute anemia 08/17/2015  . Hay fever 05/17/2015  . Can't get food down 05/17/2015  . Secondary diabetes mellitus (HCC) 03/15/2015  . Other long term (current) drug therapy 04/20/2014  . Long term current use of systemic steroids 04/20/2014   . Controlled type 2 diabetes mellitus without complication (HCC) 03/17/2014  . Clinical depression 01/19/2014  . Adenopathy 01/19/2014  . CN (constipation) 08/12/2013  . Spina bifida (HCC) 08/12/2013  . Esophagitis, reflux 10/02/2011  . Avitaminosis D 10/02/2011  . Essential (primary) hypertension 05/15/2011  . Myasthenia gravis (HCC) 12/06/2010    Past Surgical History:  Procedure Laterality Date  . ADENOIDECTOMY    . BACK SURGERY    . CHOLECYSTECTOMY N/A 02/02/2016   Procedure: LAPAROSCOPIC CHOLECYSTECTOMY WITH CHOLANGIOGRAM;  Surgeon: Leafy Ro, MD;  Location: [facility] ORS;  Service: General;  Laterality: N/A;  . DILATION AND CURETTAGE OF UTERUS    . EXCISION NECK NODULE Left December 09, 2015   [facility]  . EYE SURGERY Right   . FOOT SURGERY Bilateral    [facility]  . HYSTEROSCOPY W/D&C  2014   benign endometrial polyps  . TONSILLECTOMY      Prior to Admission medications   Medication Sig Start Date End Date Taking? Authorizing Provider  Cholecalciferol (VITAMIN D3) 50000 units CAPS Take 1 capsule by mouth once a week. 11/25/15   Historical Provider, MD  FLUOCINOLONE ACETONIDE BODY 0.01 % OIL Apply 1 application topically daily as needed. 12/29/15   Historical Provider, MD  glucose blood (BAYER CONTOUR TEST) test strip Check blood sugars three times per day. ICD-10 E11.9 05/17/15   Historical Provider, MD  glyBURIDE (DIABETA) 5 MG tablet Take 10  mg by mouth 2 (two) times daily with a meal. 03/27/16 03/27/17  Historical Provider, MD  hydrocortisone cream 0.5 % Apply 1 application topically 2 (two) times daily. Patient taking differently: Apply 1 application topically 2 (two) times daily as needed for itching.  04/21/16   Charmayne Sheer Beers, PA-C  ketoconazole (NIZORAL) 2 % cream Apply daily to flaky areas on face and under right armpit as needed. 11/17/15   Historical Provider, MD  lisinopril (PRINIVIL,ZESTRIL) 5 MG tablet Take 5 mg by mouth daily.    Historical Provider, MD   loratadine (CLARITIN) 10 MG tablet Take 10 mg by mouth daily.    Historical Provider, MD  metFORMIN (GLUCOPHAGE) 500 MG tablet Take 500 mg by mouth 2 (two) times daily with a meal.    Historical Provider, MD  mycophenolate (CELLCEPT) 500 MG tablet Take 1,500 mg by mouth 2 (two) times daily.    Historical Provider, MD  norethindrone-ethinyl estradiol (MICROGESTIN FE 1/20) 1-20 MG-MCG tablet Take 1 tablet by mouth daily. 12/12/16   Prentice Docker Defrancesco, MD  ofloxacin (FLOXIN OTIC) 0.3 % otic solution Place 5 drops into the right ear daily. 02/04/17   Phineas Semen, MD  potassium chloride SA (K-DUR,KLOR-CON) 20 MEQ tablet Take 20 mEq by mouth daily.    Historical Provider, MD  predniSONE (DELTASONE) 10 MG tablet Take 10 mg by mouth every other day. Take along with 2.5 mg tablet to equal 12.5 mg total dose.    Historical Provider, MD  predniSONE (DELTASONE) 2.5 MG tablet Take 2.5 mg by mouth every other day. Take along 10 mg tablet to equal 12.5 mg total dose.    Historical Provider, MD  pyridostigmine (MESTINON) 60 MG tablet Take 60 mg by mouth every 4 (four) hours.    Historical Provider, MD  ranitidine (ZANTAC) 150 MG tablet Take 150 mg by mouth 2 (two) times daily.    Historical Provider, MD  VESICARE 10 MG tablet Take 1 tablet by mouth daily. 11/25/15   Historical Provider, MD  Vitamin D, Ergocalciferol, (DRISDOL) 50000 units CAPS capsule Take by mouth. 10/13/16 10/13/17  Historical Provider, MD    Allergies Penicillins; Contrast media [iodinated diagnostic agents]; and Latex  Family History  Problem Relation Age of Onset  . Adopted: Yes  . Family history unknown: Yes    Social History Social History  Substance Use Topics  . Smoking status: Never Smoker  . Smokeless tobacco: Never Used  . Alcohol use No    Review of Systems Constitutional: No fever/chills Eyes: No visual changes. ENT: No sore throat. No stiff neck no neck pain Cardiovascular: Denies chest pain. Respiratory:  Denies shortness of breath. Gastrointestinal:   no vomiting.  No diarrhea.  No constipation. Genitourinary: Negative for dysuria. Musculoskeletal: Negative lower extremity swelling Skin: Negative for rash. Neurological: Negative for severe headaches, focal weakness or numbness. 10-point ROS otherwise negative.  ____________________________________________   PHYSICAL EXAM:  VITAL SIGNS: ED Triage Vitals  Enc Vitals Group     BP 03/11/17 1157 140/67     Pulse Rate 03/11/17 1157 100     Resp 03/11/17 1157 18     Temp 03/11/17 1157 99.4 F (37.4 C)     Temp Source 03/11/17 1157 Oral     SpO2 03/11/17 1157 97 %     Weight 03/11/17 1157 200 lb (90.7 kg)     Height 03/11/17 1157 5\' 1"  (1.549 m)     Head Circumference --      Peak Flow --  Pain Score 03/11/17 1202 8     Pain Loc --      Pain Edu? --      Excl. in GC? --     Constitutional: Alert and oriented. Well appearing and in no acute distress. Eyes: Conjunctivae are normal. PERRL. EOMI. Head: Atraumatic. Nose: No congestion/rhinnorhea. Mouth/Throat: Mucous membranes are moist.  Oropharynx non-erythematous. Neck: No stridor.   Nontender with no meningismus Cardiovascular: Normal rate, regular rhythm. Grossly normal heart sounds.  Good peripheral circulation. Respiratory: Normal respiratory effort.  No retractions. Lungs CTAB. Abdominal: Soft and nontender. No distention. No guarding no rebound Back:  There is no focal tenderness or step off.  there is no midline tenderness there are no lesions noted. there is no CVA tenderness Musculoskeletal: No lower extremity tenderness, no upper extremity tenderness. No joint effusions, no DVT signs strong distal pulses no edema Skin:  Skin is warm, dry and intact. No rash noted. Psychiatric: Mood and affect are normal. Speech and behavior are normal.  ____________________________________________   LABS (all labs ordered are listed, but only abnormal results are  displayed)  Labs Reviewed  URINALYSIS, COMPLETE (UACMP) WITH MICROSCOPIC - Abnormal; Notable for the following:       Result Value   Color, Urine AMBER (*)    APPearance CLOUDY (*)    Hgb urine dipstick MODERATE (*)    Protein, ur 100 (*)    Nitrite POSITIVE (*)    Leukocytes, UA TRACE (*)    Bacteria, UA MANY (*)    Squamous Epithelial / LPF 0-5 (*)    All other components within normal limits  BASIC METABOLIC PANEL - Abnormal; Notable for the following:    Sodium 132 (*)    Glucose, Bld 162 (*)    Calcium 8.7 (*)    All other components within normal limits  CBC - Abnormal; Notable for the following:    WBC 12.9 (*)    Hemoglobin 11.9 (*)    HCT 34.8 (*)    MCV 78.4 (*)    All other components within normal limits  URINE CULTURE  CULTURE, BLOOD (ROUTINE X 2)  CULTURE, BLOOD (ROUTINE X 2)  POC URINE PREG, ED  POCT PREGNANCY, URINE  POCT PREGNANCY, URINE   ____________________________________________  EKG  I personally interpreted any EKGs ordered by me or triage  ____________________________________________  RADIOLOGY  I reviewed any imaging ordered by me or triage that were performed during my shift and, if possible, patient and/or family made aware of any abnormal findings. ____________________________________________   PROCEDURES  Procedure(s) performed: None  Procedures  Critical Care performed: None  ____________________________________________   INITIAL IMPRESSION / ASSESSMENT AND PLAN / ED COURSE  Pertinent labs & imaging results that were available during my care of the patient were reviewed by me and considered in my medical decision making (see chart for details).  Patient with signs and symptoms of a urinary tract infection. She is supposed to be taking Macrobid as a precaution but she is not able to take it regularly she states. Patient states that she has had a reaction to penicillin. She does not remember what it is. It happened when she  was a child. Was documented that might involve swelling however so we will avoid cephalosporins. Her last culture shows a sensitivity to Levaquin among other many different medications. We will give her a dose of IV Levaquin here. We'll give her IV fluid. Patient very well-appearing, she and I discussed admission and she declines at this  time. There is no evidence of sepsis. I think after IV antibiotics for what appears to be a susceptible bacterium, we will hopefully be able to get her safely home with extensive return precautions. Patient is eager to eat and drink and her abdomen is benign.    ____________________________________________   FINAL CLINICAL IMPRESSION(S) / ED DIAGNOSES  Final diagnoses:  None      This chart was dictated using voice recognition software.  Despite best efforts to proofread,  errors can occur which can change meaning.      Jeanmarie PlantJames A Chynna Buerkle, MD 03/11/17 1434

## 2017-03-14 LAB — URINE CULTURE

## 2017-03-16 LAB — CULTURE, BLOOD (ROUTINE X 2)
CULTURE: NO GROWTH
CULTURE: NO GROWTH

## 2017-04-27 ENCOUNTER — Emergency Department
Admission: EM | Admit: 2017-04-27 | Discharge: 2017-04-27 | Disposition: A | Discharge status: Home / Self Care | Payer: Medicaid Other | Attending: Emergency Medicine | Admitting: Emergency Medicine

## 2017-04-27 ENCOUNTER — Encounter: Payer: Self-pay | Admitting: Emergency Medicine

## 2017-04-27 DIAGNOSIS — I129 Hypertensive chronic kidney disease with stage 1 through stage 4 chronic kidney disease, or unspecified chronic kidney disease: Secondary | ICD-10-CM | POA: Diagnosis not present

## 2017-04-27 DIAGNOSIS — Z79899 Other long term (current) drug therapy: Secondary | ICD-10-CM | POA: Insufficient documentation

## 2017-04-27 DIAGNOSIS — E1122 Type 2 diabetes mellitus with diabetic chronic kidney disease: Secondary | ICD-10-CM | POA: Diagnosis not present

## 2017-04-27 DIAGNOSIS — N189 Chronic kidney disease, unspecified: Secondary | ICD-10-CM | POA: Diagnosis not present

## 2017-04-27 DIAGNOSIS — R42 Dizziness and giddiness: Secondary | ICD-10-CM | POA: Diagnosis not present

## 2017-04-27 DIAGNOSIS — Y939 Activity, unspecified: Secondary | ICD-10-CM | POA: Insufficient documentation

## 2017-04-27 DIAGNOSIS — Z7952 Long term (current) use of systemic steroids: Secondary | ICD-10-CM | POA: Diagnosis not present

## 2017-04-27 DIAGNOSIS — Y999 Unspecified external cause status: Secondary | ICD-10-CM | POA: Diagnosis not present

## 2017-04-27 DIAGNOSIS — Y929 Unspecified place or not applicable: Secondary | ICD-10-CM | POA: Insufficient documentation

## 2017-04-27 DIAGNOSIS — Z7984 Long term (current) use of oral hypoglycemic drugs: Secondary | ICD-10-CM | POA: Insufficient documentation

## 2017-04-27 DIAGNOSIS — S0990XA Unspecified injury of head, initial encounter: Secondary | ICD-10-CM | POA: Diagnosis present

## 2017-04-27 DIAGNOSIS — W108XXA Fall (on) (from) other stairs and steps, initial encounter: Secondary | ICD-10-CM | POA: Diagnosis not present

## 2017-04-27 DIAGNOSIS — W19XXXA Unspecified fall, initial encounter: Secondary | ICD-10-CM

## 2017-04-27 LAB — BASIC METABOLIC PANEL
Anion gap: 8 (ref 5–15)
BUN: 9 mg/dL (ref 6–20)
CALCIUM: 9.1 mg/dL (ref 8.9–10.3)
CO2: 23 mmol/L (ref 22–32)
Chloride: 105 mmol/L (ref 101–111)
Creatinine, Ser: 0.58 mg/dL (ref 0.44–1.00)
GFR calc Af Amer: >60 mL/min (ref >60)
GFR calc non Af Amer: >60 mL/min (ref >60)
GLUCOSE: 134 mg/dL — AB (ref 65–99)
Potassium: 4.1 mmol/L (ref 3.5–5.1)
Sodium: 136 mmol/L (ref 135–145)

## 2017-04-27 LAB — URINALYSIS, COMPLETE (UACMP) WITH MICROSCOPIC
Bacteria, UA: NONE SEEN
Bilirubin Urine: NEGATIVE
Glucose, UA: NEGATIVE mg/dL
Hgb urine dipstick: NEGATIVE
Ketones, ur: 5 mg/dL — AB
Leukocytes, UA: NEGATIVE
Nitrite: NEGATIVE
Protein, ur: NEGATIVE mg/dL
Specific Gravity, Urine: 1.013 (ref 1.005–1.030)
pH: 6 (ref 5.0–8.0)

## 2017-04-27 LAB — CBC WITH DIFFERENTIAL/PLATELET
Basophils Absolute: 0.1 K/uL (ref 0–0.1)
Basophils Relative: 0 %
Eosinophils Absolute: 0 K/uL (ref 0–0.7)
Eosinophils Relative: 0 %
HCT: 38.6 % (ref 35.0–47.0)
Hemoglobin: 12.8 g/dL (ref 12.0–16.0)
Lymphocytes Relative: 9 %
Lymphs Abs: 1.2 K/uL (ref 1.0–3.6)
MCH: 26.3 pg (ref 26.0–34.0)
MCHC: 33.1 g/dL (ref 32.0–36.0)
MCV: 79.6 fL — ABNORMAL LOW (ref 80.0–100.0)
Monocytes Absolute: 0.2 K/uL (ref 0.2–0.9)
Monocytes Relative: 2 %
Neutro Abs: 11.5 K/uL — ABNORMAL HIGH (ref 1.4–6.5)
Neutrophils Relative %: 89 %
Platelets: 331 K/uL (ref 150–440)
RBC: 4.85 MIL/uL (ref 3.80–5.20)
RDW: 13.3 % (ref 11.5–14.5)
WBC: 13.1 K/uL — ABNORMAL HIGH (ref 3.6–11.0)

## 2017-04-27 LAB — GLUCOSE, CAPILLARY: GLUCOSE-CAPILLARY: 142 mg/dL — AB (ref 65–99)

## 2017-04-27 NOTE — ED Triage Notes (Signed)
Pt reports has been dizzy for two days. Pt reports falling down a flight of concrete stairs this morning in a two story apartment complex. Pt states hitting her head and thinks she may have had LOC for a brief period of time. Pt husband pulled up right after pt fell and brought her here. Pt alert and oriented in triage. Pt tearful in triage.

## 2017-04-27 NOTE — ED Notes (Signed)
Discussed delay with patient that the MD and her next assigned nurse are in the middle of an emergency.  Pt given an ice pack to put to lip. Pt given remote control. Denies any needs at this time.  Offered toileting to patient but unable to go at this time.

## 2017-04-27 NOTE — ED Notes (Signed)
Pt reports dizziness x 2 days states she was walking down the stairs today and tripped down about 20 stairs. Pt has a hx of spina bifida and wears leg braces and is a diabetic.  She c/o headache and denies LOC. Husband reports she was disoriented after falling. C/o small laceration near bridge of nose - bleeding under control.  Right eye lid swelling. Denies vision changes.

## 2017-04-27 NOTE — Discharge Instructions (Signed)
Please seek medical attention for any high fevers, chest pain, shortness of breath, change in behavior, persistent vomiting, bloody stool or any other new or concerning symptoms.  

## 2017-04-27 NOTE — ED Provider Notes (Signed)
[facility] Emergency Department Provider Note   ____________________________________________   I have reviewed the triage vital signs and the nursing notes.   HISTORY  Chief Complaint Fall and Dizziness   History limited by: Not Limited   HPI [name] is a 31 y.o. female who presents to the emergency department today after a fall. The patient has a history spina bifida and states that she has issues with falls at baseline. Today however she fell down a couple of steps. She did hit her head and stated she was a little dazed however denies any loss of consciousness. She denies any pain to her extremities. She states that for the past couple of days that she has been feeling increasingly dizzy. She describes it as a feeling of lightheadedness. He is not noticing any palpitations. She denies any fevers.   Past Medical History:  Diagnosis Date  . BV (bacterial vaginosis)   . Chronic kidney disease    Frequent UTI  . Diabetes mellitus without complication (HCC)   . DUB (dysfunctional uterine bleeding)   . E. coli UTI (urinary tract infection)   . GERD (gastroesophageal reflux disease)   . H/O recurrent urinary tract infection   . H/O sickle cell trait   . Heavy periods   . Hypertension   . Hypokalemia   . Myasthenia gravis (HCC)   . Myasthenia gravis (HCC)   . Pneumonia January 12, 2016   [facility]  . PONV (postoperative nausea and vomiting)   . Psoriasis   . Spina bifida (HCC)   . Trichimoniasis   . Vaginal discharge   . Yeast infection     Patient Active Problem List   Diagnosis Date Noted  . Cerebral palsy (HCC) 12/12/2016  . Class 2 obesity 12/12/2016  . Encounter for surveillance of contraceptive pills 12/12/2016  . Cholecystitis 02/02/2016  . Sepsis (HCC) 01/12/2016  . Absolute anemia 08/17/2015  . Hay fever 05/17/2015  . Can't get food down 05/17/2015  . Secondary diabetes mellitus (HCC) 03/15/2015  . Other long term (current) drug  therapy 04/20/2014  . Long term current use of systemic steroids 04/20/2014  . Controlled type 2 diabetes mellitus without complication (HCC) 03/17/2014  . Clinical depression 01/19/2014  . Adenopathy 01/19/2014  . CN (constipation) 08/12/2013  . Spina bifida (HCC) 08/12/2013  . Esophagitis, reflux 10/02/2011  . Avitaminosis D 10/02/2011  . Essential (primary) hypertension 05/15/2011  . Myasthenia gravis (HCC) 12/06/2010    Past Surgical History:  Procedure Laterality Date  . ADENOIDECTOMY    . BACK SURGERY    . CHOLECYSTECTOMY N/A 02/02/2016   Procedure: LAPAROSCOPIC CHOLECYSTECTOMY WITH CHOLANGIOGRAM;  Surgeon: Leafy Ro, MD;  Location: [facility] ORS;  Service: General;  Laterality: N/A;  . DILATION AND CURETTAGE OF UTERUS    . EXCISION NECK NODULE Left December 09, 2015   [facility]  . EYE SURGERY Right   . FOOT SURGERY Bilateral    [facility]  . HYSTEROSCOPY W/D&C  2014   benign endometrial polyps  . TONSILLECTOMY      Prior to Admission medications   Medication Sig Start Date End Date Taking? Authorizing Provider  Cholecalciferol (VITAMIN D3) 50000 units CAPS Take 1 capsule by mouth once a week. 11/25/15   Historical Provider, MD  FLUOCINOLONE ACETONIDE BODY 0.01 % OIL Apply 1 application topically daily as needed. 12/29/15   Historical Provider, MD  glucose blood (BAYER CONTOUR TEST) test strip Check blood sugars three times per day.  ICD-10 E11.9 05/17/15   Historical Provider, MD  hydrocortisone cream 0.5 % Apply 1 application topically 2 (two) times daily. Patient taking differently: Apply 1 application topically 2 (two) times daily as needed for itching.  04/21/16   Charmayne Sheer Beers, PA-C  ketoconazole (NIZORAL) 2 % cream Apply daily to flaky areas on face and under right armpit as needed. 11/17/15   Historical Provider, MD  lisinopril (PRINIVIL,ZESTRIL) 5 MG tablet Take 5 mg by mouth daily.    Historical Provider, MD  loratadine (CLARITIN) 10 MG tablet Take 10 mg by  mouth daily.    Historical Provider, MD  metFORMIN (GLUCOPHAGE) 500 MG tablet Take 500 mg by mouth 2 (two) times daily with a meal.    Historical Provider, MD  mycophenolate (CELLCEPT) 500 MG tablet Take 1,500 mg by mouth 2 (two) times daily.    Historical Provider, MD  norethindrone-ethinyl estradiol (MICROGESTIN FE 1/20) 1-20 MG-MCG tablet Take 1 tablet by mouth daily. 12/12/16   Prentice Docker Defrancesco, MD  ofloxacin (FLOXIN OTIC) 0.3 % otic solution Place 5 drops into the right ear daily. 02/04/17   Phineas Semen, MD  potassium chloride SA (K-DUR,KLOR-CON) 20 MEQ tablet Take 20 mEq by mouth daily.    Historical Provider, MD  predniSONE (DELTASONE) 10 MG tablet Take 10 mg by mouth every other day. Take along with 2.5 mg tablet to equal 12.5 mg total dose.    Historical Provider, MD  predniSONE (DELTASONE) 2.5 MG tablet Take 2.5 mg by mouth every other day. Take along 10 mg tablet to equal 12.5 mg total dose.    Historical Provider, MD  pyridostigmine (MESTINON) 60 MG tablet Take 60 mg by mouth every 4 (four) hours.    Historical Provider, MD  ranitidine (ZANTAC) 150 MG tablet Take 150 mg by mouth 2 (two) times daily.    Historical Provider, MD  VESICARE 10 MG tablet Take 1 tablet by mouth daily. 11/25/15   Historical Provider, MD  Vitamin D, Ergocalciferol, (DRISDOL) 50000 units CAPS capsule Take by mouth. 10/13/16 10/13/17  Historical Provider, MD    Allergies Penicillins; Contrast media [iodinated diagnostic agents]; and Latex  Family History  Problem Relation Age of Onset  . Adopted: Yes  . Family history unknown: Yes    Social History Social History  Substance Use Topics  . Smoking status: Never Smoker  . Smokeless tobacco: Never Used  . Alcohol use No    Review of Systems Constitutional: No fever/chills Eyes: No visual changes.  ENT: No misalignment of teeth. Cardiovascular: Denies chest pain. Respiratory: Denies shortness of breath. Gastrointestinal: No abdominal pain.  No  nausea, no vomiting.  No diarrhea.   Genitourinary: Negative for dysuria. Musculoskeletal: Negative for back pain. Skin: Negative for rash. Neurological: Positive for dizziness.   ____________________________________________   PHYSICAL EXAM:  VITAL SIGNS: ED Triage Vitals  Enc Vitals Group     BP 04/27/17 0921 (!) 150/96     Pulse Rate 04/27/17 0921 (!) 108     Resp 04/27/17 0921 18     Temp 04/27/17 0921 98.8 F (37.1 C)     Temp Source 04/27/17 0921 Oral     SpO2 04/27/17 0921 99 %     Weight 04/27/17 0921 200 lb (90.7 kg)     Height 04/27/17 0921 5\' 1"  (1.549 m)     Head Circumference --      Peak Flow --      Pain Score 04/27/17 0920 7   Constitutional: Alert and oriented.  Well appearing and in no distress. Eyes: Conjunctivae are normal. Normal extraocular movements. ENT   Head: Normocephalic. Small laceration to bridge of nose, hemostatic.   Nose: No congestion/rhinnorhea.   Mouth/Throat: Mucous membranes are moist.   Neck: No stridor. No midline tenderness.  Hematological/Lymphatic/Immunilogical: No cervical lymphadenopathy. Cardiovascular: Normal rate, regular rhythm.  No murmurs, rubs, or gallops.  Respiratory: Normal respiratory effort without tachypnea nor retractions. Breath sounds are clear and equal bilaterally. No wheezes/rales/rhonchi. Gastrointestinal: Soft and non tender. No rebound. No guarding.  Genitourinary: Deferred Musculoskeletal: Normal range of motion in all extremities. No spinal tenderness Neurologic:  Normal speech and language. No gross focal neurologic deficits are appreciated.  Skin:  Skin is warm, dry and intact. No rash noted. Psychiatric: Mood and affect are normal. Speech and behavior are normal. Patient exhibits appropriate insight and judgment.  ____________________________________________    LABS (pertinent positives/negatives)  Labs Reviewed  CBC WITH DIFFERENTIAL/PLATELET - Abnormal; Notable for the following:        Result Value   WBC 13.1 (*)    MCV 79.6 (*)    Neutro Abs 11.5 (*)    All other components within normal limits  BASIC METABOLIC PANEL - Abnormal; Notable for the following:    Glucose, Bld 134 (*)    All other components within normal limits  URINALYSIS, COMPLETE (UACMP) WITH MICROSCOPIC - Abnormal; Notable for the following:    Color, Urine YELLOW (*)    APPearance CLEAR (*)    Ketones, ur 5 (*)    Squamous Epithelial / LPF 0-5 (*)    All other components within normal limits  GLUCOSE, CAPILLARY - Abnormal; Notable for the following:    Glucose-Capillary 142 (*)    All other components within normal limits     ____________________________________________   EKG  I, Phineas SemenGraydon Traivon Morrical, attending physician, personally viewed and interpreted this EKG  EKG Time: 0924 Rate: 91 Rhythm: normal sinus rhythm Axis: left axis deviation Intervals: qtc 455 QRS: LVH ST changes: no st elevation Impression: abnormal ekg   ____________________________________________    RADIOLOGY  None   ____________________________________________   PROCEDURES  Procedures  ____________________________________________   INITIAL IMPRESSION / ASSESSMENT AND PLAN / ED COURSE  Pertinent labs & imaging results that were available during my care of the patient were reviewed by me and considered in my medical decision making (see chart for details).  Patient presents to the emergency department today after fall. The patient does have a small superficial and hemostatic laceration to the bridge of her nose does not require any advanced closure. A little bit of tenderness over the nose and right cheek. I did discuss with patient possibility getting a CT scan. At this point given no change in vision, no other concerning findings I do not think even if there was a small fracture would require any specific therapy. The patient felt comfortable deferring CT and not being exposed to radiation. In terms of  the dizziness blood work and urine without concerning findings.  ____________________________________________   FINAL CLINICAL IMPRESSION(S) / ED DIAGNOSES  Final diagnoses:  Fall, initial encounter  Dizziness     Note: This dictation was prepared with Dragon dictation. Any transcriptional errors that result from this process are unintentional     Phineas SemenGraydon Amariz Flamenco, MD 04/27/17 1438

## 2017-04-27 NOTE — ED Notes (Signed)
Pt self-catheterized herself for urine sample.

## 2017-10-25 ENCOUNTER — Other Ambulatory Visit: Payer: Self-pay | Admitting: Obstetrics and Gynecology

## 2017-12-10 NOTE — Progress Notes (Deleted)
Patient ID: [name], female   DOB: 20-Jan-1986, 31 y.o.   MRN: [id number] ANNUAL PREVENTATIVE CARE GYN  ENCOUNTER NOTE  Subjective:       [name] is a 31 y.o. G0P0000 female here for a routine annual gynecologic exam.  Current complaints: 1. No gynecologic complaints.     Gynecologic History No LMP recorded. Contraception: OCP (estrogen/progesterone) Last Pap: 12/12/2016 neg/neg. Results were: normal Last mammogram: n/a. Results were: normal  Obstetric History OB History  Gravida Para Term Preterm AB Living  0 0 0 0 0 0  SAB TAB Ectopic Multiple Live Births  0 0 0 0          Past Medical History:  Diagnosis Date  . BV (bacterial vaginosis)   . Chronic kidney disease    Frequent UTI  . Diabetes mellitus without complication (HCC)   . DUB (dysfunctional uterine bleeding)   . E. coli UTI (urinary tract infection)   . GERD (gastroesophageal reflux disease)   . H/O recurrent urinary tract infection   . H/O sickle cell trait   . Heavy periods   . Hypertension   . Hypokalemia   . Myasthenia gravis (HCC)   . Myasthenia gravis (HCC)   . Pneumonia January 12, 2016   [facility]  . PONV (postoperative nausea and vomiting)   . Psoriasis   . Spina bifida (HCC)   . Trichimoniasis   . Vaginal discharge   . Yeast infection     Past Surgical History:  Procedure Laterality Date  . ADENOIDECTOMY    . BACK SURGERY    . CHOLECYSTECTOMY N/A 02/02/2016   Procedure: LAPAROSCOPIC CHOLECYSTECTOMY WITH CHOLANGIOGRAM;  Surgeon: Leafy Ro, MD;  Location: [facility] ORS;  Service: General;  Laterality: N/A;  . DILATION AND CURETTAGE OF UTERUS    . EXCISION NECK NODULE Left December 09, 2015   [facility]  . EYE SURGERY Right   . FOOT SURGERY Bilateral    [facility]  . HYSTEROSCOPY W/D&C  2014   benign endometrial polyps  . TONSILLECTOMY      Current Outpatient Medications on File Prior to Visit  Medication Sig Dispense Refill  . FLUOCINOLONE ACETONIDE BODY 0.01 %  OIL Apply 1 application topically daily as needed.    Marland Kitchen glucose blood (BAYER CONTOUR TEST) test strip Check blood sugars three times per day. ICD-10 E11.9    . hydrocortisone cream 0.5 % Apply 1 application topically 2 (two) times daily. 30 g 0  . JUNEL FE 1/20 1-20 MG-MCG tablet TAKE 1 TABLET BY MOUTH DAILY 84 tablet 0  . KETOCONAZOLE, TOPICAL, 1 % SHAM Apply daily as needed.    Marland Kitchen lisinopril (PRINIVIL,ZESTRIL) 5 MG tablet Take 5 mg by mouth daily.    Marland Kitchen loratadine (CLARITIN) 10 MG tablet Take 10 mg by mouth daily.    . metFORMIN (GLUCOPHAGE) 500 MG tablet Take 500 mg by mouth 2 (two) times daily with a meal.    . mycophenolate (CELLCEPT) 500 MG tablet Take 1,500 mg by mouth 2 (two) times daily.    Marland Kitchen ofloxacin (FLOXIN OTIC) 0.3 % otic solution Place 5 drops into the right ear daily. (Patient not taking: Reported on 04/27/2017) 5 mL 0  . potassium chloride SA (K-DUR,KLOR-CON) 20 MEQ tablet Take 20 mEq by mouth daily.    . predniSONE (DELTASONE) 10 MG tablet Take 10 mg by mouth every other day. Take along with 2.5 mg tablet to equal 12.5 mg total dose.    Marland Kitchen  predniSONE (DELTASONE) 2.5 MG tablet Take 2.5 mg by mouth every other day. Take along 10 mg tablet to equal 12.5 mg total dose.    . pyridostigmine (MESTINON) 60 MG tablet Take 60 mg by mouth every 4 (four) hours.    . ranitidine (ZANTAC) 150 MG tablet Take 150 mg by mouth 2 (two) times daily.    . VESICARE 10 MG tablet Take 20 tablets by mouth daily.   0   No current facility-administered medications on file prior to visit.     Allergies  Allergen Reactions  . Penicillins Anaphylaxis and Rash    Has patient had a PCN reaction causing immediate rash, facial/tongue/throat swelling, SOB or lightheadedness with hypotension: Yes Has patient had a PCN reaction causing severe rash involving mucus membranes or skin necrosis: No Has patient had a PCN reaction that required hospitalization No Has patient had a PCN reaction occurring within the last 10  years: Yes If all of the above answers are "NO", then may proceed with Cephalosporin use.  . Contrast Media [Iodinated Diagnostic Agents] Itching  . Latex Rash    Social History   Socioeconomic History  . Marital status: Married    Spouse name: Not on file  . Number of children: Not on file  . Years of education: Not on file  . Highest education level: Not on file  Social Needs  . Financial resource strain: Not on file  . Food insecurity - worry: Not on file  . Food insecurity - inability: Not on file  . Transportation needs - medical: Not on file  . Transportation needs - non-medical: Not on file  Occupational History  . Not on file  Tobacco Use  . Smoking status: Never Smoker  . Smokeless tobacco: Never Used  Substance and Sexual Activity  . Alcohol use: No    Alcohol/week: 0.0 oz  . Drug use: No  . Sexual activity: Yes    Birth control/protection: Pill  Other Topics Concern  . Not on file  Social History Narrative  . Not on file    Family History  Adopted: Yes  Family history unknown: Yes    The following portions of the patient's history were reviewed and updated as appropriate: allergies, current medications, past family history, past medical history, past social history, past surgical history and problem list.  Review of Systems ROS   Objective:   There were no vitals taken for this visit. There were no vitals taken for this visit.  CONSTITUTIONAL: Well-developed, well-nourished female in no acute distress.  PSYCHIATRIC: Normal mood and affect. Normal behavior. Normal judgment and thought content. NEUROLGIC: Alert and oriented to person, place, and time. Normal muscle tone coordination. No cranial nerve deficit noted. HENT:  Normocephalic, atraumatic, External right and left ear normal. Oropharynx is clear and moist EYES: Conjunctivae and EOM are normal. Pupils are equal, round, and reactive to light. No scleral icterus.  NECK: Normal range of motion,  supple, no masses.  Normal thyroid. Left side exterior auricular lymph node visible and palpable 1.5 cm, nontender SKIN: Skin is warm and dry. No rash noted. Not diaphoretic. No erythema. No pallor. CARDIOVASCULAR: Normal heart rate noted, regular rhythm, no murmur. RESPIRATORY: Clear to auscultation bilaterally. Effort and breath sounds normal, no problems with respiration noted. BREASTS: Symmetric in size. No masses, skin changes, nipple drainage, or lymphadenopathy. ABDOMEN: Soft, normal bowel sounds, no distention noted.  No tenderness, rebound or guarding.  BLADDER: Normal PELVIC:  External Genitalia: Normal  BUS: Normal  Vagina: Normal  Cervix: Normal  Uterus: Normal; Midplane, normal size and shape, nontender, Mobile  Adnexa: Normal; Nonpalpable; nontender  RV: External Exam NormaI  MUSCULOSKELETAL: Normal range of motion. No tenderness.  No cyanosis, clubbing, or edema.  2+ distal pulses. LYMPHATIC: No Axillary, Supraclavicular, or Inguinal Adenopathy. 1.5 cm left post auricular lymph node palpable, nontender    Assessment:   Annual gynecologic examination 31 y.o. Contraception: OCP (estrogen/progesterone) bmi- 39  Plan:  Pap: due 2020 Mammogram: Not Indicated Stool Guaiac Testing:  Not Indicated Labs: thur pcp Routine preventative health maintenance measures emphasized: Exercise/Diet/Weight control, Tobacco Warnings, Alcohol/Substance use risks and Safe Sex  Birth control pills are refilled Return to Clinic - 1 Year   [place] Sharline Lehane, [place]   Note: This dictation was prepared with Dragon dictation along with smaller phrase technology. Any transcriptional errors that result from this process are unintentional.

## 2017-12-13 ENCOUNTER — Encounter: Payer: Medicaid Other | Admitting: Obstetrics and Gynecology

## 2018-01-10 ENCOUNTER — Encounter: Payer: Self-pay | Admitting: Obstetrics and Gynecology

## 2018-01-10 ENCOUNTER — Ambulatory Visit (INDEPENDENT_AMBULATORY_CARE_PROVIDER_SITE_OTHER): Payer: Medicaid Other | Admitting: Obstetrics and Gynecology

## 2018-01-10 VITALS — BP 129/84 | HR 92 | Ht 61.0 in | Wt 200.4 lb

## 2018-01-10 DIAGNOSIS — E669 Obesity, unspecified: Secondary | ICD-10-CM | POA: Diagnosis not present

## 2018-01-10 DIAGNOSIS — D573 Sickle-cell trait: Secondary | ICD-10-CM | POA: Diagnosis not present

## 2018-01-10 DIAGNOSIS — Z01419 Encounter for gynecological examination (general) (routine) without abnormal findings: Secondary | ICD-10-CM

## 2018-01-10 DIAGNOSIS — G7000 Myasthenia gravis without (acute) exacerbation: Secondary | ICD-10-CM | POA: Diagnosis not present

## 2018-01-10 DIAGNOSIS — E66812 Obesity, class 2: Secondary | ICD-10-CM

## 2018-01-10 DIAGNOSIS — Z3041 Encounter for surveillance of contraceptive pills: Secondary | ICD-10-CM | POA: Diagnosis not present

## 2018-01-10 MED ORDER — NORETHIN ACE-ETH ESTRAD-FE 1-20 MG-MCG PO TABS: 1.0000 | ORAL_TABLET | Freq: Every day | ORAL | 3 refills | Status: DC

## 2018-01-10 NOTE — Patient Instructions (Signed)
1.  No Pap smear done. 2.  Self breast awareness encouraged 3.  Screening labs are to be obtained through primary care 4.  Continue with healthy eating, exercise, and control weight loss 5.  Contraception-birth control pills, refilled 6.  Return in 1 year for annual exam  Health Maintenance, Female Adopting a healthy lifestyle and getting preventive care can go a long way to promote health and wellness. Talk with your health care provider about what schedule of regular examinations is right for you. This is a good chance for you to check in with your provider about disease prevention and staying healthy. In between checkups, there are plenty of things you can do on your own. Experts have done a lot of research about which lifestyle changes and preventive measures are most likely to keep you healthy. Ask your health care provider for more information. Weight and diet Eat a healthy diet  Be sure to include plenty of vegetables, fruits, low-fat dairy products, and lean protein.  Do not eat a lot of foods high in solid fats, added sugars, or salt.  Get regular exercise. This is one of the most important things you can do for your health. ? Most adults should exercise for at least 150 minutes each week. The exercise should increase your heart rate and make you sweat (moderate-intensity exercise). ? Most adults should also do strengthening exercises at least twice a week. This is in addition to the moderate-intensity exercise.  Maintain a healthy weight  Body mass index (BMI) is a measurement that can be used to identify possible weight problems. It estimates body fat based on height and weight. Your health care provider can help determine your BMI and help you achieve or maintain a healthy weight.  For females 28 years of age and older: ? A BMI below 18.5 is considered underweight. ? A BMI of 18.5 to 24.9 is normal. ? A BMI of 25 to 29.9 is considered overweight. ? A BMI of 30 and above is  considered obese.  Watch levels of cholesterol and blood lipids  You should start having your blood tested for lipids and cholesterol at 32 years of age, then have this test every 5 years.  You may need to have your cholesterol levels checked more often if: ? Your lipid or cholesterol levels are high. ? You are older than 32 years of age. ? You are at high risk for heart disease.  Cancer screening Lung Cancer  Lung cancer screening is recommended for adults 4-36 years old who are at high risk for lung cancer because of a history of smoking.  A yearly low-dose CT scan of the lungs is recommended for people who: ? Currently smoke. ? Have quit within the past 15 years. ? Have at least a 30-pack-year history of smoking. A pack year is smoking an average of one pack of cigarettes a day for 1 year.  Yearly screening should continue until it has been 15 years since you quit.  Yearly screening should stop if you develop a health problem that would prevent you from having lung cancer treatment.  Breast Cancer  Practice breast self-awareness. This means understanding how your breasts normally appear and feel.  It also means doing regular breast self-exams. Let your health care provider know about any changes, no matter how small.  If you are in your 20s or 30s, you should have a clinical breast exam (CBE) by a health care provider every 1-3 years as part of a  regular health exam.  If you are 40 or older, have a CBE every year. Also consider having a breast X-ray (mammogram) every year.  If you have a family history of breast cancer, talk to your health care provider about genetic screening.  If you are at high risk for breast cancer, talk to your health care provider about having an MRI and a mammogram every year.  Breast cancer gene (BRCA) assessment is recommended for women who have family members with BRCA-related cancers. BRCA-related cancers  include: ? Breast. ? Ovarian. ? Tubal. ? Peritoneal cancers.  Results of the assessment will determine the need for genetic counseling and BRCA1 and BRCA2 testing.  Cervical Cancer Your health care provider may recommend that you be screened regularly for cancer of the pelvic organs (ovaries, uterus, and vagina). This screening involves a pelvic examination, including checking for microscopic changes to the surface of your cervix (Pap test). You may be encouraged to have this screening done every 3 years, beginning at age 74.  For women ages 34-65, health care providers may recommend pelvic exams and Pap testing every 3 years, or they may recommend the Pap and pelvic exam, combined with testing for human papilloma virus (HPV), every 5 years. Some types of HPV increase your risk of cervical cancer. Testing for HPV may also be done on women of any age with unclear Pap test results.  Other health care providers may not recommend any screening for nonpregnant women who are considered low risk for pelvic cancer and who do not have symptoms. Ask your health care provider if a screening pelvic exam is right for you.  If you have had past treatment for cervical cancer or a condition that could lead to cancer, you need Pap tests and screening for cancer for at least 20 years after your treatment. If Pap tests have been discontinued, your risk factors (such as having a new sexual partner) need to be reassessed to determine if screening should resume. Some women have medical problems that increase the chance of getting cervical cancer. In these cases, your health care provider may recommend more frequent screening and Pap tests.  Colorectal Cancer  This type of cancer can be detected and often prevented.  Routine colorectal cancer screening usually begins at 32 years of age and continues through 32 years of age.  Your health care provider may recommend screening at an earlier age if you have risk factors  for colon cancer.  Your health care provider may also recommend using home test kits to check for hidden blood in the stool.  A small camera at the end of a tube can be used to examine your colon directly (sigmoidoscopy or colonoscopy). This is done to check for the earliest forms of colorectal cancer.  Routine screening usually begins at age 70.  Direct examination of the colon should be repeated every 5-10 years through 32 years of age. However, you may need to be screened more often if early forms of precancerous polyps or small growths are found.  Skin Cancer  Check your skin from head to toe regularly.  Tell your health care provider about any new moles or changes in moles, especially if there is a change in a mole's shape or color.  Also tell your health care provider if you have a mole that is larger than the size of a pencil eraser.  Always use sunscreen. Apply sunscreen liberally and repeatedly throughout the day.  Protect yourself by wearing long sleeves, pants,  a wide-brimmed hat, and sunglasses whenever you are outside.  Heart disease, diabetes, and high blood pressure  High blood pressure causes heart disease and increases the risk of stroke. High blood pressure is more likely to develop in: ? People who have blood pressure in the high end of the normal range (130-139/85-89 mm Hg). ? People who are overweight or obese. ? People who are African American.  If you are 82-70 years of age, have your blood pressure checked every 3-5 years. If you are 56 years of age or older, have your blood pressure checked every year. You should have your blood pressure measured twice-once when you are at a hospital or clinic, and once when you are not at a hospital or clinic. Record the average of the two measurements. To check your blood pressure when you are not at a hospital or clinic, you can use: ? An automated blood pressure machine at a pharmacy. ? A home blood pressure monitor.  If  you are between 62 years and 4 years old, ask your health care provider if you should take aspirin to prevent strokes.  Have regular diabetes screenings. This involves taking a blood sample to check your fasting blood sugar level. ? If you are at a normal weight and have a low risk for diabetes, have this test once every three years after 32 years of age. ? If you are overweight and have a high risk for diabetes, consider being tested at a younger age or more often. Preventing infection Hepatitis B  If you have a higher risk for hepatitis B, you should be screened for this virus. You are considered at high risk for hepatitis B if: ? You were born in a country where hepatitis B is common. Ask your health care provider which countries are considered high risk. ? Your parents were born in a high-risk country, and you have not been immunized against hepatitis B (hepatitis B vaccine). ? You have HIV or AIDS. ? You use needles to inject street drugs. ? You live with someone who has hepatitis B. ? You have had sex with someone who has hepatitis B. ? You get hemodialysis treatment. ? You take certain medicines for conditions, including cancer, organ transplantation, and autoimmune conditions.  Hepatitis C  Blood testing is recommended for: ? Everyone born from [birth year] through 1965. ? Anyone with known risk factors for hepatitis C.  Sexually transmitted infections (STIs)  You should be screened for sexually transmitted infections (STIs) including gonorrhea and chlamydia if: ? You are sexually active and are younger than 32 years of age. ? You are older than 32 years of age and your health care provider tells you that you are at risk for this type of infection. ? Your sexual activity has changed since you were last screened and you are at an increased risk for chlamydia or gonorrhea. Ask your health care provider if you are at risk.  If you do not have HIV, but are at risk, it may be recommended  that you take a prescription medicine daily to prevent HIV infection. This is called pre-exposure prophylaxis (PrEP). You are considered at risk if: ? You are sexually active and do not regularly use condoms or know the HIV status of your partner(s). ? You take drugs by injection. ? You are sexually active with a partner who has HIV.  Talk with your health care provider about whether you are at high risk of being infected with HIV. If you choose  to begin PrEP, you should first be tested for HIV. You should then be tested every 3 months for as long as you are taking PrEP. Pregnancy  If you are premenopausal and you may become pregnant, ask your health care provider about preconception counseling.  If you may become pregnant, take 400 to 800 micrograms (mcg) of folic acid every day.  If you want to prevent pregnancy, talk to your health care provider about birth control (contraception). Osteoporosis and menopause  Osteoporosis is a disease in which the bones lose minerals and strength with aging. This can result in serious bone fractures. Your risk for osteoporosis can be identified using a bone density scan.  If you are 26 years of age or older, or if you are at risk for osteoporosis and fractures, ask your health care provider if you should be screened.  Ask your health care provider whether you should take a calcium or vitamin D supplement to lower your risk for osteoporosis.  Menopause may have certain physical symptoms and risks.  Hormone replacement therapy may reduce some of these symptoms and risks. Talk to your health care provider about whether hormone replacement therapy is right for you. Follow these instructions at home:  Schedule regular health, dental, and eye exams.  Stay current with your immunizations.  Do not use any tobacco products including cigarettes, chewing tobacco, or electronic cigarettes.  If you are pregnant, do not drink alcohol.  If you are  breastfeeding, limit how much and how often you drink alcohol.  Limit alcohol intake to no more than 1 drink per day for nonpregnant women. One drink equals 12 ounces of beer, 5 ounces of wine, or 1 ounces of hard liquor.  Do not use street drugs.  Do not share needles.  Ask your health care provider for help if you need support or information about quitting drugs.  Tell your health care provider if you often feel depressed.  Tell your health care provider if you have ever been abused or do not feel safe at home. This information is not intended to replace advice given to you by your health care provider. Make sure you discuss any questions you have with your health care provider. Document Released: 06/26/2011 Document Revised: 05/18/2016 Document Reviewed: 09/14/2015 Elsevier Interactive Patient Education  [employer].

## 2018-01-10 NOTE — Progress Notes (Signed)
Patient ID: [name] OverallBrigitte Mccarver, female   DOB: 12/25/1985, 32 y.o.   MRN: [id number] ANNUAL PREVENTATIVE CARE GYN  ENCOUNTER NOTE  Subjective:       [name] is a 32 y.o. G0P0000 female here for a routine annual gynecologic exam.  Current complaints: 1. No gynecologic complaints.     Gynecologic History LMP-12/16/2017 Contraception: OCP (estrogen/progesterone) Last Pap: 12/12/2016 neg/neg. Results were: normal Last mammogram: n/a. Results were: normal  Obstetric History OB History  Gravida Para Term Preterm AB Living  0 0 0 0 0 0  SAB TAB Ectopic Multiple Live Births  0 0 0 0          Past Medical History:  Diagnosis Date  . BV (bacterial vaginosis)   . Chronic kidney disease    Frequent UTI  . Diabetes mellitus without complication (HCC)   . DUB (dysfunctional uterine bleeding)   . E. coli UTI (urinary tract infection)   . GERD (gastroesophageal reflux disease)   . H/O recurrent urinary tract infection   . H/O sickle cell trait   . Heavy periods   . Hypertension   . Hypokalemia   . Myasthenia gravis (HCC)   . Myasthenia gravis (HCC)   . [facility]  . PONV (postoperative nausea and vomiting)   . Psoriasis   . Spina bifida (HCC)   . Trichimoniasis   . Vaginal discharge   . Yeast infection     Past Surgical History:  Procedure Laterality Date  . ADENOIDECTOMY    . BACK SURGERY    . CHOLECYSTECTOMY N/A 02/02/2016   Procedure: LAPAROSCOPIC CHOLECYSTECTOMY WITH CHOLANGIOGRAM;  Surgeon: Leafy Roiego F Pabon, MD;  Location: [facility] ORS;  Service: General;  Laterality: N/A;  . DILATION AND CURETTAGE OF UTERUS    . EXCISION NECK NODULE Left December 09, 2015   [facility]  . EYE SURGERY Right   . FO[facility] Bilateral    [facility]  . HYSTEROSCOPY W/D&C  2014   benign endometrial polyps  . TONSILLECTOMY      Current Outpatient Medications on File Prior to Visit  Medication Sig Dispense Refill  . FLUOCINOLONE ACETONIDE BODY 0.01 %  OIL Apply 1 application topically daily as needed.    Marland Kitchen. glucose blood (BAYER CONTOUR TEST) test strip Check blood sugars three times per day. ICD-10 E11.9    . hydrocortisone cream 0.5 % Apply 1 application topically 2 (two) times daily. 30 g 0  . JUNEL FE 1/20 1-20 MG-MCG tablet TAKE 1 TABLET BY MOUTH DAILY 84 tablet 0  . KETOCONAZOLE, TOPICAL, 1 % SHAM Apply daily as needed.    Marland Kitchen. lisinopril (PRINIVIL,ZESTRIL) 5 MG tablet Take 5 mg by mouth daily.    Marland Kitchen. loratadine (CLARITIN) 10 MG tablet Take 10 mg by mouth daily.    . metFORMIN (GLUCOPHAGE) 500 MG tablet Take 500 mg by mouth 2 (two) times daily with a meal.    . mycophenolate (CELLCEPT) 500 MG tablet Take 1,500 mg by mouth 2 (two) times daily.    Marland Kitchen. ofloxacin (FLOXIN OTIC) 0.3 % otic solution Place 5 drops into the right ear daily. (Patient not taking: Reported on 04/27/2017) 5 mL 0  . potassium chloride SA (K-DUR,KLOR-CON) 20 MEQ tablet Take 20 mEq by mouth daily.    . predniSONE (DELTASONE) 10 MG tablet Take 10 mg by mouth every other day. Take along with 2.5 mg tablet to equal 12.5 mg total dose.    . predniSONE (  DELTASONE) 2.5 MG tablet Take 2.5 mg by mouth every other day. Take along 10 mg tablet to equal 12.5 mg total dose.    . pyridostigmine (MESTINON) 60 MG tablet Take 60 mg by mouth every 4 (four) hours.    . ranitidine (ZANTAC) 150 MG tablet Take 150 mg by mouth 2 (two) times daily.    . VESICARE 10 MG tablet Take 20 tablets by mouth daily.   0   No current facility-administered medications on file prior to visit.     Allergies  Allergen Reactions  . Penicillins Anaphylaxis and Rash    Has patient had a PCN reaction causing immediate rash, facial/tongue/throat swelling, SOB or lightheadedness with hypotension: Yes Has patient had a PCN reaction causing severe rash involving mucus membranes or skin necrosis: No Has patient had a PCN reaction that required hospitalization No Has patient had a PCN reaction occurring within the last 10  years: Yes If all of the above answers are "NO", then may proceed with Cephalosporin use.  . Contrast Media [Iodinated Diagnostic Agents] Itching  . Latex Rash    Social History   Socioeconomic History  . Marital status: Married    Spouse name: Not on file  . Number of children: Not on file  . Years of education: Not on file  . Highest education level: Not on file  Social Needs  . Financial resource strain: Not on file  . Food insecurity - worry: Not on file  . Food insecurity - inability: Not on file  . Transportation needs - medical: Not on file  . Transportation needs - non-medical: Not on file  Occupational History  . Not on file  Tobacco Use  . Smoking status: Never Smoker  . Smokeless tobacco: Never Used  Substance and Sexual Activity  . Alcohol use: No    Alcohol/week: 0.0 oz  . Drug use: No  . Sexual activity: Yes    Birth control/protection: Pill  Other Topics Concern  . Not on file  Social History Narrative  . Not on file    Family History  Adopted: Yes  Family history unknown: Yes    The following portions of the patient's history were reviewed and updated as appropriate: allergies, current medications, past family history, past medical history, past social history, past surgical history and problem list.  Review of Systems Review of Systems  Constitutional: Positive for malaise/fatigue. Negative for chills, fever and weight loss.  HENT: Negative.   Eyes: Negative.   Respiratory: Negative.   Cardiovascular: Negative.   Gastrointestinal: Negative.   Genitourinary: Negative.        Menstrual cycles are regular  Musculoskeletal: Negative.   Skin:       History of dandruff  Neurological: Negative.  Negative for weakness.  Endo/Heme/Allergies: Negative.   Psychiatric/Behavioral: Negative.      Objective:   BP 129/84   Pulse 92   Ht 5\' 1"  (1.549 m)   Wt 200 lb 6.4 oz (90.9 kg)   LMP 12/16/2017 (Exact Date)   BMI 37.87 kg/m   CONSTITUTIONAL:  Well-developed, well-nourished female in no acute distress.  PSYCHIATRIC: Normal mood and affect. Normal behavior. Normal judgment and thought content. NEUROLGIC: Alert and oriented to person, place, and time. Normal muscle tone coordination. No cranial nerve deficit noted. HENT:  Normocephalic, atraumatic, External right and left ear normal. Oropharynx is clear and moist EYES: Conjunctivae and EOM are normal.  No scleral icterus.  NECK: Normal range of motion, supple, no masses.  Normal thyroid.  SKIN: Skin is warm and dry. No rash noted. Not diaphoretic. No erythema. No pallor. CARDIOVASCULAR: Normal heart rate noted, regular rhythm, no murmur. RESPIRATORY: Clear to auscultation bilaterally. Effort and breath sounds normal, no problems with respiration noted. BREASTS: Symmetric in size. No masses, skin changes, nipple drainage, or lymphadenopathy. ABDOMEN: Soft, normal bowel sounds, no distention noted.  No tenderness, rebound or guarding.  BLADDER: Normal PELVIC:  External Genitalia: Normal  BUS: Normal  Vagina: Minimal white discharge; no lesions  Cervix: Normal; scarred with stenotic loss; no cervical motion tenderness  Uterus: Normal; Midplane, normal size and shape, nontender, Mobile  Adnexa: Normal; Nonpalpable; nontender  RV: External Exam NormaI  MUSCULOSKELETAL: Normal range of motion. No tenderness.  No cyanosis, clubbing, or edema.  2+ distal pulses. LYMPHATIC: No Axillary, Supraclavicular, or Inguinal Adenopathy.     Assessment:   Annual gynecologic examination 32 y.o. Contraception: OCP (estrogen/progesterone) bmi- 37 Recent diagnosis of sickle cell trait; history of recurrent UTIs; now on antibiotic prophylaxis Myasthenia gravis, on medication Decreased sex drive; association with birth control pills is noted; patient understands we may adjust contraception if desired it to help resolve decreased libido  Plan:  Pap: due 2020 Mammogram: Not Indicated Stool Guaiac  Testing:  Not Indicated Labs: thur pcp Routine preventative health maintenance measures emphasized: Exercise/Diet/Weight control, Tobacco Warnings, Alcohol/Substance use risks and Safe Sex  Birth control pills are refilled Return to Clinic - 1 Year  Akshitha [place], CMA  Herold Harms, MD   Note: This dictation was prepared with Dragon dictation along with smaller phrase technology. Any transcriptional errors that result from this process are unintentional.

## 2018-01-23 ENCOUNTER — Encounter: Payer: Self-pay | Admitting: Emergency Medicine

## 2018-01-23 ENCOUNTER — Emergency Department
Admission: EM | Admit: 2018-01-23 | Discharge: 2018-01-23 | Disposition: A | Discharge status: Home / Self Care | Payer: Medicaid Other | Attending: Emergency Medicine | Admitting: Emergency Medicine

## 2018-01-23 ENCOUNTER — Other Ambulatory Visit: Payer: Self-pay

## 2018-01-23 ENCOUNTER — Emergency Department: Payer: Medicaid Other

## 2018-01-23 DIAGNOSIS — Z7984 Long term (current) use of oral hypoglycemic drugs: Secondary | ICD-10-CM | POA: Diagnosis not present

## 2018-01-23 DIAGNOSIS — E1122 Type 2 diabetes mellitus with diabetic chronic kidney disease: Secondary | ICD-10-CM | POA: Diagnosis not present

## 2018-01-23 DIAGNOSIS — R51 Headache: Secondary | ICD-10-CM | POA: Insufficient documentation

## 2018-01-23 DIAGNOSIS — Z91040 Latex allergy status: Secondary | ICD-10-CM | POA: Insufficient documentation

## 2018-01-23 DIAGNOSIS — Z79899 Other long term (current) drug therapy: Secondary | ICD-10-CM | POA: Insufficient documentation

## 2018-01-23 DIAGNOSIS — N189 Chronic kidney disease, unspecified: Secondary | ICD-10-CM | POA: Diagnosis not present

## 2018-01-23 DIAGNOSIS — I129 Hypertensive chronic kidney disease with stage 1 through stage 4 chronic kidney disease, or unspecified chronic kidney disease: Secondary | ICD-10-CM | POA: Insufficient documentation

## 2018-01-23 DIAGNOSIS — R519 Headache, unspecified: Secondary | ICD-10-CM

## 2018-01-23 LAB — URINALYSIS, COMPLETE (UACMP) WITH MICROSCOPIC
Bilirubin Urine: NEGATIVE
Glucose, UA: 150 mg/dL — AB
HGB URINE DIPSTICK: NEGATIVE
KETONES UR: 5 mg/dL — AB
Leukocytes, UA: NEGATIVE
NITRITE: POSITIVE — AB
PROTEIN: NEGATIVE mg/dL
Specific Gravity, Urine: 1.018 (ref 1.005–1.030)
pH: 5 (ref 5.0–8.0)

## 2018-01-23 LAB — CBC WITH DIFFERENTIAL/PLATELET
Basophils Absolute: 0.1 10*3/uL (ref 0–0.1)
Basophils Relative: 1 %
Eosinophils Absolute: 0.1 10*3/uL (ref 0–0.7)
Eosinophils Relative: 1 %
HEMATOCRIT: 39.8 % (ref 35.0–47.0)
HEMOGLOBIN: 13.2 g/dL (ref 12.0–16.0)
LYMPHS ABS: 2.3 10*3/uL (ref 1.0–3.6)
LYMPHS PCT: 18 %
MCH: 26.9 pg (ref 26.0–34.0)
MCHC: 33.1 g/dL (ref 32.0–36.0)
MCV: 81.3 fL (ref 80.0–100.0)
MONOS PCT: 5 %
Monocytes Absolute: 0.7 10*3/uL (ref 0.2–0.9)
NEUTROS ABS: 10 10*3/uL — AB (ref 1.4–6.5)
Neutrophils Relative %: 75 %
Platelets: 391 10*3/uL (ref 150–440)
RBC: 4.9 MIL/uL (ref 3.80–5.20)
RDW: 12.8 % (ref 11.5–14.5)
WBC: 13.2 10*3/uL — ABNORMAL HIGH (ref 3.6–11.0)

## 2018-01-23 LAB — BASIC METABOLIC PANEL
Anion gap: 10 (ref 5–15)
BUN: 9 mg/dL (ref 6–20)
CHLORIDE: 103 mmol/L (ref 101–111)
CO2: 22 mmol/L (ref 22–32)
Calcium: 9 mg/dL (ref 8.9–10.3)
Creatinine, Ser: 0.67 mg/dL (ref 0.44–1.00)
GFR calc Af Amer: >60 mL/min (ref >60)
GFR calc non Af Amer: >60 mL/min (ref >60)
Glucose, Bld: 167 mg/dL — ABNORMAL HIGH (ref 65–99)
Potassium: 3.8 mmol/L (ref 3.5–5.1)
Sodium: 135 mmol/L (ref 135–145)

## 2018-01-23 LAB — GLUCOSE, CAPILLARY: Glucose-Capillary: 171 mg/dL — ABNORMAL HIGH (ref 65–99)

## 2018-01-23 NOTE — Discharge Instructions (Signed)

## 2018-01-23 NOTE — ED Triage Notes (Signed)
Here for shooting pains in head for 1-2 weeks.  Reports hands have locked up at times but only lasts for about 20 minutes; has had xrays through one of regular doctors.  Pt states "I am only here because I want my head checked".  Has had increased stress.  Increased thirst, diabetic.  Has not checked blood sugars. On pills.  Mild increased urination.

## 2018-01-23 NOTE — ED Notes (Signed)
MD at bedside. 

## 2018-01-23 NOTE — ED Provider Notes (Signed)
[facility] Emergency Department Provider Note  ____________________________________________   First MD Initiated Contact with Patient 01/23/18 2050     (approximate)  I have reviewed the triage vital signs and the nursing notes.   HISTORY  Chief Complaint Headache    HPI [name] is a 32 y.o. female with a complicated past medical history who presents primarily for evaluation of acute onset headache today, sharp, and severe.  It is almost completely resolved but she was scared by the episode because she does not typically have headaches.  Shooting pain in her heads for 1-2 weeks but today was the worst and was severe, nothing in particular made it better or worse.  She states that during the time that it was hurting, primarily a sharp and throbbing pain, she states that her hands locked up with one finger over the other and she had to use the other hand to unlock the fingers.  She feels better now and states that she has been under a great deal of stress recently.  She is also had increased thirst lately and has been taking her diabetes medicine but has not been checking her blood sugar.  She is taking nitrofurantoin for chronic urinary tract infection.  She states that she has a lot of side effects to various medications.  [facility] including Dr. Dimas Aguas who she sees for myasthenia gravis.  She is not having any generalized weakness, no difficulty breathing, no chest pain, no visual disturbances, no difficulty swallowing or speaking.  No difficulty with ambulation greater than baseline; she has a boot on her right foot with a chronic foot wound but it is stable and no worse than usual.  States that she is here today because she wants her head checked and to make sure it is okay, but otherwise she is comfortable with the plan to follow-up as an outpatient.  Past Medical History:  Diagnosis Date  . BV (bacterial vaginosis)   . Chronic kidney  disease    Frequent UTI  . Diabetes mellitus without complication (HCC)   . DUB (dysfunctional uterine bleeding)   . E. coli UTI (urinary tract infection)   . GERD (gastroesophageal reflux disease)   . H/O recurrent urinary tract infection   . H/O sickle cell trait   . Heavy periods   . Hypertension   . Hypokalemia   . Myasthenia gravis (HCC)   . Myasthenia gravis (HCC)   . Pneumonia January 12, 2016   [facility]  . PONV (postoperative nausea and vomiting)   . Psoriasis   . Spina bifida (HCC)   . Trichimoniasis   . Vaginal discharge   . Yeast infection     Patient Active Problem List   Diagnosis Date Noted  . Sickle cell trait (HCC) 01/10/2018  . Cerebral palsy (HCC) 12/12/2016  . Class 2 obesity 12/12/2016  . Encounter for surveillance of contraceptive pills 12/12/2016  . Cholecystitis 02/02/2016  . Sepsis (HCC) 01/12/2016  . Absolute anemia 08/17/2015  . Hay fever 05/17/2015  . Can't get food down 05/17/2015  . Secondary diabetes mellitus (HCC) 03/15/2015  . Other long term (current) drug therapy 04/20/2014  . Long term current use of systemic steroids 04/20/2014  . Controlled type 2 diabetes mellitus without complication (HCC) 03/17/2014  . Clinical depression 01/19/2014  . Adenopathy 01/19/2014  . CN (constipation) 08/12/2013  . Spina bifida (HCC) 08/12/2013  . Esophagitis, reflux 10/02/2011  . Avitaminosis D 10/02/2011  . Essential (primary)  hypertension 05/15/2011  . Myasthenia gravis (HCC) 12/06/2010    Past Surgical History:  Procedure Laterality Date  . ADENOIDECTOMY    . BACK SURGERY    . CHOLECYSTECTOMY N/A 02/02/2016   Procedure: LAPAROSCOPIC CHOLECYSTECTOMY WITH CHOLANGIOGRAM;  Surgeon: Leafy Roiego F Pabon, MD;  Location: [facility] ORS;  Service: General;  Laterality: N/A;  . DILATION AND CURETTAGE OF UTERUS    . EXCISION NECK NODULE Left December 09, 2015   [facility]  . EYE SURGERY Right   . FOOT SURGERY Bilateral    [facility]  . HYSTEROSCOPY W/D&C   2014   benign endometrial polyps  . TONSILLECTOMY      Prior to Admission medications   Medication Sig Start Date End Date Taking? Authorizing Provider  glucose blood (BAYER CONTOUR TEST) test strip Check blood sugars three times per day. ICD-10 E11.9 05/17/15   [provider]  hydrocortisone cream 0.5 % Apply 1 application topically 2 (two) times daily. 04/21/16   Beers, Charmayne Sheerharles M, PA-C  KETOCONAZOLE, TOPICAL, 1 % SHAM Apply daily as needed. 11/17/15   [provider]  lisinopril (PRINIVIL,ZESTRIL) 5 MG tablet Take 5 mg by mouth daily.    [provider]  loratadine (CLARITIN) 10 MG tablet Take 10 mg by mouth daily.    [provider]  metFORMIN (GLUCOPHAGE) 500 MG tablet Take 500 mg by mouth 2 (two) times daily with a meal.    [provider]  mycophenolate (CELLCEPT) 500 MG tablet Take 1,500 mg by mouth 2 (two) times daily.    [provider]  nitrofurantoin, macrocrystal-monohydrate, (MACROBID) 100 MG capsule Take 100 mg by mouth at bedtime.    [provider]  norethindrone-ethinyl estradiol (JUNEL FE 1/20) 1-20 MG-MCG tablet Take 1 tablet by mouth daily. 01/10/18   Defrancesco, Prentice DockerMartin A, MD  potassium chloride SA (K-DUR,KLOR-CON) 20 MEQ tablet Take 20 mEq by mouth daily.    [provider]  predniSONE (DELTASONE) 2.5 MG tablet Take 2.5 mg by mouth every other day. Take along 10 mg tablet to equal 12.5 mg total dose.    [provider]  pyridostigmine (MESTINON) 60 MG tablet Take 60 mg by mouth every 4 (four) hours.    [provider]  ranitidine (ZANTAC) 150 MG tablet Take 150 mg by mouth 2 (two) times daily.    [provider]  VESICARE 10 MG tablet Take 20 tablets by mouth daily.  11/25/15   [provider]    Allergies Penicillins; Contrast media [iodinated diagnostic agents]; and Latex  Family History  Adopted: Yes  Family history unknown: Yes    Social History Social  History   Tobacco Use  . Smoking status: Never Smoker  . Smokeless tobacco: Never Used  Substance Use Topics  . Alcohol use: No    Alcohol/week: 0.0 oz  . Drug use: No    Review of Systems Constitutional: No fever/chills Eyes: No visual changes. ENT: No sore throat. Cardiovascular: Denies chest pain. Respiratory: Denies shortness of breath. Gastrointestinal: No abdominal pain.  No nausea, no vomiting.  No diarrhea.  No constipation. Genitourinary: Negative for dysuria. Musculoskeletal: Negative for neck pain.  Negative for back pain. Integumentary: Negative for rash. Neurological: Shooting headaches for 1-2 weeks, worse today, now resolved   ____________________________________________   PHYSICAL EXAM:  VITAL SIGNS: ED Triage Vitals  Enc Vitals Group     BP 01/23/18 1727 (!) 150/96     Pulse Rate 01/23/18 1727 96  Resp 01/23/18 1727 20     Temp 01/23/18 1727 99.3 F (37.4 C)     Temp Source 01/23/18 1727 Oral     SpO2 01/23/18 1727 98 %     Weight 01/23/18 1728 90.7 kg (200 lb)     Height 01/23/18 1728 1.575 m (5\' 2" )     Head Circumference --      Peak Flow --      Pain Score 01/23/18 1731 8     Pain Loc --      Pain Edu? --      Excl. in GC? --     Constitutional: Alert and oriented. Well appearing and in no acute distress. Eyes: Conjunctivae are normal. PERRL. EOMI. Head: Atraumatic. Nose: No congestion/rhinnorhea. Mouth/Throat: Mucous membranes are moist. Neck: No stridor.  No meningeal signs.   Cardiovascular: Normal rate, regular rhythm. Good peripheral circulation. Grossly normal heart sounds. Respiratory: Normal respiratory effort.  No retractions. Lungs CTAB. Gastrointestinal: Soft and nontender. No distention.  Musculoskeletal: No lower extremity tenderness nor edema. No gross deformities of extremities. Neurologic:  Normal speech and language. No gross focal neurologic deficits are appreciated.  Good muscle strength throughout with no evidence  of any focal deficits Skin:  Skin is warm, dry and intact. No rash noted. Psychiatric: Mood and affect are normal. Speech and behavior are normal.  ____________________________________________   LABS (all labs ordered are listed, but only abnormal results are displayed)  Labs Reviewed  GLUCOSE, CAPILLARY - Abnormal; Notable for the following components:      Result Value   Glucose-Capillary 171 (*)    All other components within normal limits  CBC WITH DIFFERENTIAL/PLATELET - Abnormal; Notable for the following components:   WBC 13.2 (*)    Neutro Abs 10.0 (*)    All other components within normal limits  BASIC METABOLIC PANEL - Abnormal; Notable for the following components:   Glucose, Bld 167 (*)    All other components within normal limits  URINALYSIS, COMPLETE (UACMP) WITH MICROSCOPIC - Abnormal; Notable for the following components:   Color, Urine YELLOW (*)    APPearance CLOUDY (*)    Glucose, UA 150 (*)    Ketones, ur 5 (*)    Nitrite POSITIVE (*)    Bacteria, UA MANY (*)    Squamous Epithelial / LPF 0-5 (*)    All other components within normal limits  URINE CULTURE   ____________________________________________  EKG  None - EKG not ordered by ED physician ____________________________________________  RADIOLOGY   ED MD interpretation:  No acute/emergent abnormalities  Official radiology report(s): Ct Head Wo Contrast  Result Date: 01/23/2018 CLINICAL DATA:  Headache EXAM: CT HEAD WITHOUT CONTRAST TECHNIQUE: Contiguous axial images were obtained from the base of the skull through the vertex without intravenous contrast. COMPARISON:  None. FINDINGS: Brain: No acute intracranial abnormality. Specifically, no hemorrhage, hydrocephalus, mass lesion, acute infarction, or significant intracranial injury. Vascular: No hyperdense vessel or unexpected calcification. Skull: No acute calvarial abnormality. Sinuses/Orbits: Visualized paranasal sinuses and mastoids clear.  Orbital soft tissues unremarkable. Other: None IMPRESSION: No acute intracranial abnormality. Electronically Signed   By: Charlett Nose M.D.   On: 01/23/2018 21:18    ____________________________________________   PROCEDURES  Critical Care performed: No   Procedure(s) performed:   Procedures   ____________________________________________   INITIAL IMPRESSION / ASSESSMENT AND PLAN / ED COURSE  As part of my medical decision making, I reviewed the following data within the electronic MEDICAL RECORD NUMBER Nursing notes reviewed and  incorporated and Labs reviewed     Differential diagnosis includes, but is not limited to, intracranial hemorrhage, meningitis/encephalitis, previous head trauma, cavernous venous thrombosis, tension headache, temporal arteritis, migraine or migraine equivalent, idiopathic intracranial hypertension, and non-specific headache.  However the patient that the headache is describing sounds most consistent with a migraine although certainly stress-induced headache is possible.  I do not really know how to interpret her report that her fingers her hands locked up although it is possible she was hyperventilating during the episode which could explain why everything resolved when she calmed down.  She has no focal neurological deficits, and although she does have a number of chronic medical issues, her exam and workup today is reassuring.  She has nitrite positive urine and reported to me that she takes nitrofurantoin; I suggested I start her on Keflex instead but she tells me that she has bad side effects to most medications.  We decided that I would order a urine culture but not treat with any different medication at this time the possibility for medication side effects.  She is comfortable with the plan for a noncontrast head CT and follow-up as an outpatient if it is reassuring and I think that is appropriate.  Clinical Course as of Jan 23 2200  Wed Jan 23, 2018  2135 No  evidence of any acute intracranial she will follow-up with her neurologist CT Head Wo Contrast [CF]    Clinical Course User Index [CF] Loleta Rose, MD    ____________________________________________  FINAL CLINICAL IMPRESSION(S) / ED DIAGNOSES  Final diagnoses:  Acute nonintractable headache, unspecified headache type     MEDICATIONS GIVEN DURING THIS VISIT:  Medications - No data to display   ED Discharge Orders    None       Note:  This document was prepared using Dragon voice recognition software and may include unintentional dictation errors.    Loleta Rose, MD 01/23/18 2201

## 2018-01-26 LAB — URINE CULTURE
Culture: >=100000 — AB
SPECIAL REQUESTS: NORMAL

## 2018-01-29 ENCOUNTER — Emergency Department
Admission: EM | Admit: 2018-01-29 | Discharge: 2018-01-29 | Disposition: A | Discharge status: Home / Self Care | Payer: Medicaid Other | Attending: Emergency Medicine | Admitting: Emergency Medicine

## 2018-01-29 ENCOUNTER — Other Ambulatory Visit: Payer: Self-pay

## 2018-01-29 ENCOUNTER — Encounter: Payer: Self-pay | Admitting: Emergency Medicine

## 2018-01-29 ENCOUNTER — Emergency Department: Payer: Medicaid Other

## 2018-01-29 DIAGNOSIS — Z7984 Long term (current) use of oral hypoglycemic drugs: Secondary | ICD-10-CM | POA: Diagnosis not present

## 2018-01-29 DIAGNOSIS — Z79899 Other long term (current) drug therapy: Secondary | ICD-10-CM | POA: Diagnosis not present

## 2018-01-29 DIAGNOSIS — M5416 Radiculopathy, lumbar region: Secondary | ICD-10-CM | POA: Diagnosis not present

## 2018-01-29 DIAGNOSIS — I10 Essential (primary) hypertension: Secondary | ICD-10-CM | POA: Insufficient documentation

## 2018-01-29 DIAGNOSIS — M25562 Pain in left knee: Secondary | ICD-10-CM

## 2018-01-29 DIAGNOSIS — M545 Low back pain: Secondary | ICD-10-CM | POA: Diagnosis present

## 2018-01-29 DIAGNOSIS — E119 Type 2 diabetes mellitus without complications: Secondary | ICD-10-CM | POA: Insufficient documentation

## 2018-01-29 MED ORDER — CYCLOBENZAPRINE HCL 10 MG PO TABS: 10.0000 mg | ORAL_TABLET | Freq: Three times a day (TID) | ORAL | 0 refills | Status: DC | PRN

## 2018-01-29 MED ORDER — HYDROCODONE-ACETAMINOPHEN 5-325 MG PO TABS
1.0000 | ORAL_TABLET | Freq: Once | ORAL | Status: AC
  Administered 2018-01-29: 1 via ORAL
  Filled 2018-01-29: qty 1

## 2018-01-29 MED ORDER — HYDROCODONE-ACETAMINOPHEN 5-325 MG PO TABS: 1.0000 | ORAL_TABLET | ORAL | 0 refills | Status: DC | PRN

## 2018-01-29 MED ORDER — CYCLOBENZAPRINE HCL 10 MG PO TABS
10.0000 mg | ORAL_TABLET | Freq: Once | ORAL | Status: AC
  Administered 2018-01-29: 10 mg via ORAL
  Filled 2018-01-29: qty 1

## 2018-01-29 NOTE — ED Notes (Signed)
Pt stating that she fell last night. Pt has a boot on her RLE that she has been wearing and she said it caused her to trip. Pt stating lower back pain that has been going on since then. Pt stating left lower back is worse and is shooting down her LLE. Pain is worse with movement. Pt denying any issues with BM or urination. Pt stating that she is having some numbness in her LLE near her ankle when she has spasms.

## 2018-01-29 NOTE — ED Triage Notes (Signed)
Pt fell last night and now having left knee pain.

## 2018-01-29 NOTE — ED Provider Notes (Signed)
[facility] Emergency Department Provider Note ____________________________________________  Time seen: Approximately 4:37 PM  I have reviewed the triage vital signs and the nursing notes.   HISTORY  Chief Complaint Fall    HPI [name] is a 32 y.o. female who presents to the emergency department for evaluation of lower back pain with radiation into the left lower extremity.  She has a boot on her right lower extremity which caused her to trip.  She denies loss of consciousness or striking her head during the fall.  Pain in the lower back is worse with movement.  No alleviating measures have been attempted for this complaint prior to arrival. Past Medical History:  Diagnosis Date  . BV (bacterial vaginosis)   . Chronic kidney disease    Frequent UTI  . Diabetes mellitus without complication (HCC)   . DUB (dysfunctional uterine bleeding)   . E. coli UTI (urinary tract infection)   . GERD (gastroesophageal reflux disease)   . H/O recurrent urinary tract infection   . H/O sickle cell trait   . Heavy periods   . Hypertension   . Hypokalemia   . Myasthenia gravis (HCC)   . Myasthenia gravis (HCC)   . Pneumonia January 12, 2016   [facility]  . PONV (postoperative nausea and vomiting)   . Psoriasis   . Spina bifida (HCC)   . Trichimoniasis   . Vaginal discharge   . Yeast infection     Patient Active Problem List   Diagnosis Date Noted  . Sickle cell trait (HCC) 01/10/2018  . Cerebral palsy (HCC) 12/12/2016  . Class 2 obesity 12/12/2016  . Encounter for surveillance of contraceptive pills 12/12/2016  . Cholecystitis 02/02/2016  . Sepsis (HCC) 01/12/2016  . Absolute anemia 08/17/2015  . Hay fever 05/17/2015  . Can't get food down 05/17/2015  . Secondary diabetes mellitus (HCC) 03/15/2015  . Other long term (current) drug therapy 04/20/2014  . Long term current use of systemic steroids 04/20/2014  . Controlled type 2 diabetes mellitus without  complication (HCC) 03/17/2014  . Clinical depression 01/19/2014  . Adenopathy 01/19/2014  . CN (constipation) 08/12/2013  . Spina bifida (HCC) 08/12/2013  . Esophagitis, reflux 10/02/2011  . Avitaminosis D 10/02/2011  . Essential (primary) hypertension 05/15/2011  . Myasthenia gravis (HCC) 12/06/2010    Past Surgical History:  Procedure Laterality Date  . ADENOIDECTOMY    . BACK SURGERY    . CHOLECYSTECTOMY N/A 02/02/2016   Procedure: LAPAROSCOPIC CHOLECYSTECTOMY WITH CHOLANGIOGRAM;  Surgeon: Leafy Ro, MD;  Location: [facility] ORS;  Service: General;  Laterality: N/A;  . DILATION AND CURETTAGE OF UTERUS    . EXCISION NECK NODULE Left December 09, 2015   [facility]  . EYE SURGERY Right   . FOOT SURGERY Bilateral    [facility]  . HYSTEROSCOPY W/D&C  2014   benign endometrial polyps  . TONSILLECTOMY      Prior to Admission medications   Medication Sig Start Date End Date Taking? Authorizing Provider  cyclobenzaprine (FLEXERIL) 10 MG tablet Take 1 tablet (10 mg total) by mouth 3 (three) times daily as needed for muscle spasms. 01/29/18   Vaishnav Demartin B, FNP  glucose blood (BAYER CONTOUR TEST) test strip Check blood sugars three times per day. ICD-10 E11.9 05/17/15   [provider]  HYDROcodone-acetaminophen (NORCO/VICODIN) 5-325 MG tablet Take 1 tablet by mouth every 4 (four) hours as needed for moderate pain. 01/29/18 01/29/19  Chinita Pester, FNP  hydrocortisone cream 0.5 % Apply 1 application topically 2 (two) times daily. 04/21/16   Beers, Charmayne Sheerharles M, PA-C  KETOCONAZOLE, TOPICAL, 1 % SHAM Apply daily as needed. 11/17/15   [provider]  lisinopril (PRINIVIL,ZESTRIL) 5 MG tablet Take 5 mg by mouth daily.    [provider]  loratadine (CLARITIN) 10 MG tablet Take 10 mg by mouth daily.    [provider]  metFORMIN (GLUCOPHAGE) 500 MG tablet Take 500 mg by mouth 2 (two) times daily with a meal.    [provider]  mycophenolate  (CELLCEPT) 500 MG tablet Take 1,500 mg by mouth 2 (two) times daily.    [provider]  nitrofurantoin, macrocrystal-monohydrate, (MACROBID) 100 MG capsule Take 100 mg by mouth at bedtime.    [provider]  norethindrone-ethinyl estradiol (JUNEL FE 1/20) 1-20 MG-MCG tablet Take 1 tablet by mouth daily. 01/10/18   Defrancesco, Prentice DockerMartin A, MD  potassium chloride SA (K-DUR,KLOR-CON) 20 MEQ tablet Take 20 mEq by mouth daily.    [provider]  predniSONE (DELTASONE) 2.5 MG tablet Take 2.5 mg by mouth every other day. Take along 10 mg tablet to equal 12.5 mg total dose.    [provider]  pyridostigmine (MESTINON) 60 MG tablet Take 60 mg by mouth every 4 (four) hours.    [provider]  ranitidine (ZANTAC) 150 MG tablet Take 150 mg by mouth 2 (two) times daily.    [provider]  VESICARE 10 MG tablet Take 20 tablets by mouth daily.  11/25/15   [provider]    Allergies Penicillins; Contrast media [iodinated diagnostic agents]; and Latex  Family History  Adopted: Yes  Family history unknown: Yes    Social History Social History   Tobacco Use  . Smoking status: Never Smoker  . Smokeless tobacco: Never Used  Substance Use Topics  . Alcohol use: No    Alcohol/week: 0.0 oz  . Drug use: No    Review of Systems Constitutional: Negative for fever or new illness. Cardiovascular: Negative for chest pain Respiratory: Negative for cough or shortness of breath Musculoskeletal: Positive for myalgias Skin: Negative for open lesion or wound. Neurological: Positive for paresthesias in the left lower extremity.  ____________________________________________   PHYSICAL EXAM:  VITAL SIGNS: ED Triage Vitals  Enc Vitals Group     BP 01/29/18 1529 134/80     Pulse Rate 01/29/18 1529 93     Resp 01/29/18 1529 20     Temp 01/29/18 1529 98.8 F (37.1 C)     Temp Source 01/29/18 1529 Oral     SpO2 01/29/18 1529 100 %     Weight  --      Height --      Head Circumference --      Peak Flow --      Pain Score 01/29/18 1530 9     Pain Loc --      Pain Edu? --      Excl. in GC? --     Constitutional: Alert and oriented. Well appearing and in no acute distress. Eyes: Conjunctivae are clear without discharge or drainage Head: Atraumatic Neck: Supple Respiratory: Respirations even and unlabored. Musculoskeletal: Diffuse tenderness over the left thigh.  No focal tenderness is identified of the left hip or thigh.  Diffuse tenderness over the left knee as well without focal tenderness there either.  At baseline, the patient is unable to perform full range of motion of the hip but states that  her current ability is unchanged. Neurologic: Awake, alert, oriented. Skin: Intact Psychiatric: Behavior and affect are appropriate  ____________________________________________   LABS (all labs ordered are listed, but only abnormal results are displayed)  Labs Reviewed - No data to display ____________________________________________  RADIOLOGY  Image of the left femur are both negative for acute bony abnormality per radiology. ____________________________________________   PROCEDURES  Procedures  ____________________________________________   INITIAL IMPRESSION / ASSESSMENT AND PLAN / ED COURSE  [name] is a 32 y.o. female who presents to the emergency department for evaluation of symptoms and exam most consistent with left-sided sciatica.  She was given prescriptions for Flexeril and Norco and encouraged to follow-up with the primary care provider for symptoms that are not improving over the week.  She is instructed to return to the emergency department for symptoms of change or worsen if she is unable to schedule an appointment.  Medications  cyclobenzaprine (FLEXERIL) tablet 10 mg (10 mg Oral Given 01/29/18 1822)  HYDROcodone-acetaminophen (NORCO/VICODIN) 5-325 MG per tablet 1 tablet (1 tablet Oral Given  01/29/18 1822)    Pertinent labs & imaging results that were available during my care of the patient were reviewed by me and considered in my medical decision making (see chart for details).  _________________________________________   FINAL CLINICAL IMPRESSION(S) / ED DIAGNOSES  Final diagnoses:  Acute lumbar radiculopathy  Acute pain of left knee    ED Discharge Orders        Ordered    cyclobenzaprine (FLEXERIL) 10 MG tablet  3 times daily PRN     01/29/18 1801    HYDROcodone-acetaminophen (NORCO/VICODIN) 5-325 MG tablet  Every 4 hours PRN     01/29/18 1801       If controlled substance prescribed during this visit, 12 month history viewed on the [facility] prior to issuing an initial prescription for Schedule II or III opiod.    Chinita Pester, FNP 01/30/18 0026    Nita Sickle, MD 01/30/18 [phone]

## 2018-01-29 NOTE — ED Notes (Signed)
Pt discharged to home.  Family member driving.  Discharge instructions reviewed.  Verbalized understanding.  No questions or concerns at this time.  Teach back verified.  Pt in NAD.  No items left in ED.   

## 2018-02-26 ENCOUNTER — Encounter: Payer: Self-pay | Admitting: Emergency Medicine

## 2018-02-26 ENCOUNTER — Emergency Department
Admission: EM | Admit: 2018-02-26 | Discharge: 2018-02-26 | Disposition: A | Discharge status: Home / Self Care | Payer: Medicaid Other | Attending: Emergency Medicine | Admitting: Emergency Medicine

## 2018-02-26 DIAGNOSIS — I129 Hypertensive chronic kidney disease with stage 1 through stage 4 chronic kidney disease, or unspecified chronic kidney disease: Secondary | ICD-10-CM | POA: Insufficient documentation

## 2018-02-26 DIAGNOSIS — Z91040 Latex allergy status: Secondary | ICD-10-CM | POA: Diagnosis not present

## 2018-02-26 DIAGNOSIS — E1122 Type 2 diabetes mellitus with diabetic chronic kidney disease: Secondary | ICD-10-CM | POA: Diagnosis not present

## 2018-02-26 DIAGNOSIS — R319 Hematuria, unspecified: Secondary | ICD-10-CM

## 2018-02-26 DIAGNOSIS — R509 Fever, unspecified: Secondary | ICD-10-CM | POA: Diagnosis present

## 2018-02-26 DIAGNOSIS — N390 Urinary tract infection, site not specified: Secondary | ICD-10-CM | POA: Diagnosis not present

## 2018-02-26 DIAGNOSIS — N189 Chronic kidney disease, unspecified: Secondary | ICD-10-CM | POA: Insufficient documentation

## 2018-02-26 DIAGNOSIS — Z79899 Other long term (current) drug therapy: Secondary | ICD-10-CM | POA: Insufficient documentation

## 2018-02-26 DIAGNOSIS — Z7984 Long term (current) use of oral hypoglycemic drugs: Secondary | ICD-10-CM | POA: Insufficient documentation

## 2018-02-26 LAB — COMPREHENSIVE METABOLIC PANEL
ALK PHOS: 52 U/L (ref 38–126)
ALT: 11 U/L — ABNORMAL LOW (ref 14–54)
ANION GAP: 10 (ref 5–15)
AST: 16 U/L (ref 15–41)
Albumin: 3.9 g/dL (ref 3.5–5.0)
BILIRUBIN TOTAL: 0.5 mg/dL (ref 0.3–1.2)
BUN: 9 mg/dL (ref 6–20)
CALCIUM: 8.9 mg/dL (ref 8.9–10.3)
CO2: 23 mmol/L (ref 22–32)
Chloride: 104 mmol/L (ref 101–111)
Creatinine, Ser: 0.69 mg/dL (ref 0.44–1.00)
Glucose, Bld: 116 mg/dL — ABNORMAL HIGH (ref 65–99)
Potassium: 3.8 mmol/L (ref 3.5–5.1)
Sodium: 137 mmol/L (ref 135–145)
TOTAL PROTEIN: 7.7 g/dL (ref 6.5–8.1)

## 2018-02-26 LAB — URINALYSIS, COMPLETE (UACMP) WITH MICROSCOPIC
Bilirubin Urine: NEGATIVE
Glucose, UA: NEGATIVE mg/dL
KETONES UR: NEGATIVE mg/dL
NITRITE: POSITIVE — AB
PH: 5 (ref 5.0–8.0)
Protein, ur: 30 mg/dL — AB
Specific Gravity, Urine: 1.019 (ref 1.005–1.030)

## 2018-02-26 LAB — CBC
HEMATOCRIT: 40 % (ref 35.0–47.0)
HEMOGLOBIN: 12.9 g/dL (ref 12.0–16.0)
MCH: 26.5 pg (ref 26.0–34.0)
MCHC: 32.4 g/dL (ref 32.0–36.0)
MCV: 81.9 fL (ref 80.0–100.0)
Platelets: 338 10*3/uL (ref 150–440)
RBC: 4.88 MIL/uL (ref 3.80–5.20)
RDW: 12.7 % (ref 11.5–14.5)
WBC: 9 10*3/uL (ref 3.6–11.0)

## 2018-02-26 LAB — PREGNANCY, URINE: Preg Test, Ur: NEGATIVE

## 2018-02-26 MED ORDER — SULFAMETHOXAZOLE-TRIMETHOPRIM 800-160 MG PO TABS
1.0000 | ORAL_TABLET | Freq: Once | ORAL | Status: AC
  Administered 2018-02-26: 1 via ORAL
  Filled 2018-02-26: qty 1

## 2018-02-26 MED ORDER — LISINOPRIL 10 MG PO TABS: ORAL_TABLET | ORAL | 1 refills | Status: DC

## 2018-02-26 MED ORDER — ACETAMINOPHEN 325 MG PO TABS
975.0000 mg | ORAL_TABLET | Freq: Once | ORAL | Status: AC
  Administered 2018-02-26: 975 mg via ORAL

## 2018-02-26 MED ORDER — SULFAMETHOXAZOLE-TRIMETHOPRIM 800-160 MG PO TABS: 1.0000 | ORAL_TABLET | Freq: Two times a day (BID) | ORAL | 0 refills | Status: DC

## 2018-02-26 MED ORDER — PHENAZOPYRIDINE HCL 200 MG PO TABS: 200.0000 mg | ORAL_TABLET | Freq: Three times a day (TID) | ORAL | 0 refills | Status: DC | PRN

## 2018-02-26 MED ORDER — ACETAMINOPHEN 325 MG PO TABS
650.0000 mg | ORAL_TABLET | Freq: Once | ORAL | Status: DC
  Filled 2018-02-26: qty 2

## 2018-02-26 NOTE — ED Provider Notes (Signed)
[facility] Emergency Department Provider Note   ____________________________________________   First MD Initiated Contact with Patient 02/26/18 1201     (approximate)  I have reviewed the triage vital signs and the nursing notes.   HISTORY  Chief Complaint Hypertension and Recurrent UTI    HPI [name] is a 32 y.o. female Who was sent from the podiatrist's office for hypertension. She says her blood pressures been high for a while. Her doctor had her on lisinopril 5 and increased to 10 several months ago. She is not had increased since then. Patient also self catheters and thinks she may have a UTI she has some suprapubic pain and tenderness and dysuria. She had a temperature of 99.3. She is not nauseated or vomiting. She does have a headache as well and asked for something for that. She has myasthenia gravis and cannot take Cipro. She is allergic to penicillin as well. She thinks she might of had swelling but she's not sure since the allergy was when she was a child.   Past Medical History:  Diagnosis Date  . BV (bacterial vaginosis)   . Chronic kidney disease    Frequent UTI  . Diabetes mellitus without complication (HCC)   . DUB (dysfunctional uterine bleeding)   . E. coli UTI (urinary tract infection)   . GERD (gastroesophageal reflux disease)   . H/O recurrent urinary tract infection   . H/O sickle cell trait   . Heavy periods   . Hypertension   . Hypokalemia   . Myasthenia gravis (HCC)   . Myasthenia gravis (HCC)   . Pneumonia January 12, 2016   [facility]  . PONV (postoperative nausea and vomiting)   . Psoriasis   . Spina bifida (HCC)   . Trichimoniasis   . Vaginal discharge   . Yeast infection     Patient Active Problem List   Diagnosis Date Noted  . Sickle cell trait (HCC) 01/10/2018  . Cerebral palsy (HCC) 12/12/2016  . Class 2 obesity 12/12/2016  . Encounter for surveillance of contraceptive pills 12/12/2016  .  Cholecystitis 02/02/2016  . Sepsis (HCC) 01/12/2016  . Absolute anemia 08/17/2015  . Hay fever 05/17/2015  . Can't get food down 05/17/2015  . Secondary diabetes mellitus (HCC) 03/15/2015  . Other long term (current) drug therapy 04/20/2014  . Long term current use of systemic steroids 04/20/2014  . Controlled type 2 diabetes mellitus without complication (HCC) 03/17/2014  . Clinical depression 01/19/2014  . Adenopathy 01/19/2014  . CN (constipation) 08/12/2013  . Spina bifida (HCC) 08/12/2013  . Esophagitis, reflux 10/02/2011  . Avitaminosis D 10/02/2011  . Essential (primary) hypertension 05/15/2011  . Myasthenia gravis (HCC) 12/06/2010    Past Surgical History:  Procedure Laterality Date  . ADENOIDECTOMY    . BACK SURGERY    . CHOLECYSTECTOMY N/A 02/02/2016   Procedure: LAPAROSCOPIC CHOLECYSTECTOMY WITH CHOLANGIOGRAM;  Surgeon: Leafy Ro, MD;  Location: [facility] ORS;  Service: General;  Laterality: N/A;  . DILATION AND CURETTAGE OF UTERUS    . EXCISION NECK NODULE Left December 09, 2015   [facility]  . EYE SURGERY Right   . FOOT SURGERY Bilateral    [facility]  . HYSTEROSCOPY W/D&C  2014   benign endometrial polyps  . TONSILLECTOMY      Prior to Admission medications   Medication Sig Start Date End Date Taking? Authorizing Provider  cyclobenzaprine (FLEXERIL) 10 MG tablet Take 1 tablet (10 mg total) by mouth  3 (three) times daily as needed for muscle spasms. 01/29/18   Triplett, Cari B, FNP  glucose blood (BAYER CONTOUR TEST) test strip Check blood sugars three times per day. ICD-10 E11.9 05/17/15   [provider]  HYDROcodone-acetaminophen (NORCO/VICODIN) 5-325 MG tablet Take 1 tablet by mouth every 4 (four) hours as needed for moderate pain. 01/29/18 01/29/19  Triplett, Rulon Eisenmengerari B, FNP  hydrocortisone cream 0.5 % Apply 1 application topically 2 (two) times daily. 04/21/16   Beers, Charmayne Sheerharles M, PA-C  KETOCONAZOLE, TOPICAL, 1 % SHAM Apply daily as needed. 11/17/15    [provider]  lisinopril (PRINIVIL,ZESTRIL) 5 MG tablet Take 5 mg by mouth daily.    [provider]  loratadine (CLARITIN) 10 MG tablet Take 10 mg by mouth daily.    [provider]  metFORMIN (GLUCOPHAGE) 500 MG tablet Take 500 mg by mouth 2 (two) times daily with a meal.    [provider]  mycophenolate (CELLCEPT) 500 MG tablet Take 1,500 mg by mouth 2 (two) times daily.    [provider]  nitrofurantoin, macrocrystal-monohydrate, (MACROBID) 100 MG capsule Take 100 mg by mouth at bedtime.    [provider]  norethindrone-ethinyl estradiol (JUNEL FE 1/20) 1-20 MG-MCG tablet Take 1 tablet by mouth daily. 01/10/18   Defrancesco, Prentice DockerMartin A, MD  phenazopyridine (PYRIDIUM) 200 MG tablet Take 1 tablet (200 mg total) by mouth 3 (three) times daily as needed for pain. pharmacist please check and make sure patient who has myasthenia can take Pyridium. I have checked in up-to-date and cannot find any contraindications but please check in your systems. 02/26/18 02/26/19  Arnaldo NatalMalinda, Paul F, MD  potassium chloride SA (K-DUR,KLOR-CON) 20 MEQ tablet Take 20 mEq by mouth daily.    [provider]  predniSONE (DELTASONE) 2.5 MG tablet Take 2.5 mg by mouth every other day. Take along 10 mg tablet to equal 12.5 mg total dose.    [provider]  pyridostigmine (MESTINON) 60 MG tablet Take 60 mg by mouth every 4 (four) hours.    [provider]  ranitidine (ZANTAC) 150 MG tablet Take 150 mg by mouth 2 (two) times daily.    [provider]  sulfamethoxazole-trimethoprim (BACTRIM DS,SEPTRA DS) 800-160 MG tablet Take 1 tablet by mouth 2 (two) times daily. 02/26/18   Arnaldo NatalMalinda, Paul F, MD  VESICARE 10 MG tablet Take 20 tablets by mouth daily.  11/25/15   [provider]    Allergies Penicillins; Contrast media [iodinated diagnostic agents]; and Latex  Family History  Adopted: Yes  Family history unknown: Yes    Social  History Social History   Tobacco Use  . Smoking status: Never Smoker  . Smokeless tobacco: Never Used  Substance Use Topics  . Alcohol use: No    Alcohol/week: 0.0 oz  . Drug use: No    Review of Systems  Constitutional: No fever/chills Eyes: No visual changes. ENT: No sore throat. Cardiovascular: Denies chest pain. Respiratory: Denies shortness of breath. Gastrointestinal:suprapubic abdominal pain only.  No nausea, no vomiting.  No diarrhea.  No constipation. Genitourinary: Negative for dysuria. Musculoskeletal: Negative for back pain. Skin: Negative for rash. Neurological: Negative for focal weakness   ____________________________________________   PHYSICAL EXAM:  VITAL SIGNS: ED Triage Vitals  Enc Vitals Group     BP 02/26/18 0953 (!) 159/106     Pulse Rate 02/26/18 0953 87     Resp 02/26/18 0953 18     Temp 02/26/18 0953 99.3 F (37.4 C)  Temp Source 02/26/18 0953 Oral     SpO2 02/26/18 0953 100 %     Weight 02/26/18 0954 199 lb (90.3 kg)     Height 02/26/18 0954 5' (1.524 m)     Head Circumference --      Peak Flow --      Pain Score 02/26/18 0954 7     Pain Loc --      Pain Edu? --      Excl. in GC? --     Constitutional: Alert and oriented. Well appearing and in no acute distress. Eyes: Conjunctivae are normal. fundi appear normal except for some AV nicking Head: Atraumatic. Nose: No congestion/rhinnorhea. Mouth/Throat: Mucous membranes are moist.  Oropharynx non-erythematous. Neck: No stridor.  Cardiovascular: Normal rate, regular rhythm. Grossly normal heart sounds.  Good peripheral circulation. Respiratory: Normal respiratory effort.  No retractions. Lungs CTAB. Gastrointestinal: Soft and nontenderexcept for mildly suprapubically. No distention. No abdominal bruits. No CVA tenderness. Neurologic:  Normal speech and language. No gross focal neurologic deficits are appreciated. Skin:  Skin is warm, dry and intact. No rash noted. Psychiatric:  Mood and affect are normal. Speech and behavior are normal.  ____________________________________________   LABS (all labs ordered are listed, but only abnormal results are displayed)  Labs Reviewed  URINALYSIS, COMPLETE (UACMP) WITH MICROSCOPIC - Abnormal; Notable for the following components:      Result Value   Color, Urine YELLOW (*)    APPearance TURBID (*)    Hgb urine dipstick MODERATE (*)    Protein, ur 30 (*)    Nitrite POSITIVE (*)    Leukocytes, UA MODERATE (*)    Bacteria, UA MANY (*)    Squamous Epithelial / LPF 6-30 (*)    All other components within normal limits  COMPREHENSIVE METABOLIC PANEL - Abnormal; Notable for the following components:   Glucose, Bld 116 (*)    ALT 11 (*)    All other components within normal limits  CBC  PREGNANCY, URINE   ____________________________________________  EKG   ____________________________________________  RADIOLOGY  ED MD interpretation  Official radiology report(s): No results found.  ____________________________________________   PROCEDURES  Procedure(s) performed:   Procedures  Critical Care performed:   ____________________________________________   INITIAL IMPRESSION / ASSESSMENT AND PLAN / ED COURSE  I will give the patient Bactrim DS as this does not seem to have any problems with myasthenia in up-to-date. I will give her prescription for Pyridium but asked her to have the pharmacist check it out. I cannot find any contraindications pretty mucin up-to-date either though. Patient has a headache which is not severe I will give her some Tylenol for that and a dose of Bactrim here. She will return if she is worse or no better in 2 days.I will also increase lisinopril to 20 mg once a day. This will double her dose.      ____________________________________________   FINAL CLINICAL IMPRESSION(S) / ED DIAGNOSES  Final diagnoses:  Urinary tract infection with hematuria, site unspecified     ED  Discharge Orders        Ordered    phenazopyridine (PYRIDIUM) 200 MG tablet  3 times daily PRN     02/26/18 1230    sulfamethoxazole-trimethoprim (BACTRIM DS,SEPTRA DS) 800-160 MG tablet  2 times daily     02/26/18 1230       Note:  This document was prepared using Dragon voice recognition software and may include unintentional dictation errors.    Arnaldo Natal, MD  02/26/18 1235  

## 2018-02-26 NOTE — ED Notes (Signed)
Pt stating that she was sent here by her doctor because of elevated BP. Pt stating that she self-caths herself and is concerned that she may have a UTI due to pain with catheterization, lower back, and in lower abdomen. Pt stating HA also. Pt in NAD at this time. Pt stating that she has taken two of her Lisinopril today already and is concerned that her medication may need readjusted.

## 2018-02-26 NOTE — ED Triage Notes (Signed)
Pt sent by podiatrist for hypertension. Pt also has c/o of UTI.

## 2018-02-26 NOTE — Discharge Instructions (Signed)
please take the Bactrim 1 pill twice a day. Have the pharmacist check if the Pyridium can be used in myasthenia. You can take that 3 times a day it may help with the pain and discomfort. it will make your urine orange and stain. please return if you're worse or no better in 2 days. Worse includes feeling sicker vomiting having a fever or worse pain. Please increase lisinopril to 20 mg once a day. Have your doctor check on you in the next week.

## 2018-03-06 ENCOUNTER — Telehealth: Payer: Self-pay | Admitting: Obstetrics and Gynecology

## 2018-03-06 NOTE — Telephone Encounter (Signed)
Pt has missed at least 2 pills. She has started bleeding today. Advised her to take Wednesdays pill tonite. Continue to complete this pack. Aware she may have aub for the next several weeks. Use back up x 1 month. May want to set a timer on her phone to help her remember to take her pills.

## 2018-03-06 NOTE — Telephone Encounter (Signed)
The patient called and state that she would like to have [name] give her a call back in regards to her having some abnormal issues. Please advise.

## 2018-06-12 ENCOUNTER — Encounter: Payer: Self-pay | Admitting: Emergency Medicine

## 2018-06-12 ENCOUNTER — Other Ambulatory Visit: Payer: Self-pay

## 2018-06-12 ENCOUNTER — Emergency Department
Admission: EM | Admit: 2018-06-12 | Discharge: 2018-06-12 | Disposition: A | Discharge status: Home / Self Care | Payer: Medicaid Other | Attending: Emergency Medicine | Admitting: Emergency Medicine

## 2018-06-12 DIAGNOSIS — I10 Essential (primary) hypertension: Secondary | ICD-10-CM | POA: Insufficient documentation

## 2018-06-12 DIAGNOSIS — G43909 Migraine, unspecified, not intractable, without status migrainosus: Secondary | ICD-10-CM | POA: Insufficient documentation

## 2018-06-12 DIAGNOSIS — Z7984 Long term (current) use of oral hypoglycemic drugs: Secondary | ICD-10-CM | POA: Insufficient documentation

## 2018-06-12 DIAGNOSIS — Z79899 Other long term (current) drug therapy: Secondary | ICD-10-CM | POA: Diagnosis not present

## 2018-06-12 DIAGNOSIS — N390 Urinary tract infection, site not specified: Secondary | ICD-10-CM | POA: Diagnosis not present

## 2018-06-12 DIAGNOSIS — E119 Type 2 diabetes mellitus without complications: Secondary | ICD-10-CM | POA: Diagnosis not present

## 2018-06-12 LAB — BASIC METABOLIC PANEL
ANION GAP: 9 (ref 5–15)
BUN: 8 mg/dL (ref 6–20)
CALCIUM: 8.7 mg/dL — AB (ref 8.9–10.3)
CO2: 23 mmol/L (ref 22–32)
CREATININE: 0.78 mg/dL (ref 0.44–1.00)
Chloride: 102 mmol/L (ref 101–111)
Glucose, Bld: 114 mg/dL — ABNORMAL HIGH (ref 65–99)
Potassium: 4.5 mmol/L (ref 3.5–5.1)
SODIUM: 134 mmol/L — AB (ref 135–145)

## 2018-06-12 LAB — CBC
HEMATOCRIT: 37.7 % (ref 35.0–47.0)
Hemoglobin: 13 g/dL (ref 12.0–16.0)
MCH: 27.7 pg (ref 26.0–34.0)
MCHC: 34.5 g/dL (ref 32.0–36.0)
MCV: 80.3 fL (ref 80.0–100.0)
PLATELETS: 296 10*3/uL (ref 150–440)
RBC: 4.69 MIL/uL (ref 3.80–5.20)
RDW: 12.8 % (ref 11.5–14.5)
WBC: 12.7 10*3/uL — AB (ref 3.6–11.0)

## 2018-06-12 LAB — POCT PREGNANCY, URINE: PREG TEST UR: NEGATIVE

## 2018-06-12 LAB — URINALYSIS, COMPLETE (UACMP) WITH MICROSCOPIC
Bilirubin Urine: NEGATIVE
GLUCOSE, UA: NEGATIVE mg/dL
KETONES UR: 20 mg/dL — AB
NITRITE: POSITIVE — AB
PROTEIN: NEGATIVE mg/dL
Specific Gravity, Urine: 1.016 (ref 1.005–1.030)
pH: 5 (ref 5.0–8.0)

## 2018-06-12 LAB — GLUCOSE, CAPILLARY: GLUCOSE-CAPILLARY: 99 mg/dL (ref 65–99)

## 2018-06-12 MED ORDER — BUTALBITAL-APAP-CAFFEINE 50-325-40 MG PO TABS: 1.0000 | ORAL_TABLET | Freq: Four times a day (QID) | ORAL | 0 refills | Status: DC | PRN

## 2018-06-12 MED ORDER — CIPROFLOXACIN IN D5W 400 MG/200ML IV SOLN
400.0000 mg | Freq: Once | INTRAVENOUS | Status: AC
  Administered 2018-06-12: 400 mg via INTRAVENOUS
  Filled 2018-06-12: qty 200

## 2018-06-12 MED ORDER — SULFAMETHOXAZOLE-TRIMETHOPRIM 800-160 MG PO TABS: 1.0000 | ORAL_TABLET | Freq: Two times a day (BID) | ORAL | 0 refills | Status: DC

## 2018-06-12 MED ORDER — SODIUM CHLORIDE 0.9 % IV SOLN
1000.0000 mL | Freq: Once | INTRAVENOUS | Status: AC
  Administered 2018-06-12: 1000 mL via INTRAVENOUS

## 2018-06-12 MED ORDER — DEXTROSE 5 % IV SOLN
20.0000 mg | Freq: Once | INTRAVENOUS | Status: AC
  Administered 2018-06-12: 20 mg via INTRAVENOUS
  Filled 2018-06-12: qty 4

## 2018-06-12 MED ORDER — KETOROLAC TROMETHAMINE 30 MG/ML IJ SOLN
30.0000 mg | Freq: Once | INTRAMUSCULAR | Status: AC
  Administered 2018-06-12: 30 mg via INTRAVENOUS
  Filled 2018-06-12: qty 1

## 2018-06-12 MED ORDER — DIPHENHYDRAMINE HCL 50 MG/ML IJ SOLN
25.0000 mg | Freq: Once | INTRAMUSCULAR | Status: AC
  Administered 2018-06-12: 25 mg via INTRAVENOUS
  Filled 2018-06-12: qty 1

## 2018-06-12 NOTE — ED Notes (Signed)
Informed RN that patient has been roomed and is ready for evaluation.  Patient in NAD at this time and call bell placed within reach.   

## 2018-06-12 NOTE — ED Triage Notes (Signed)
Pt c/o migraines x 2-3 days, pt also states she has had increased thirst and thinks she may have UTI.  Pt states she has spina bifida and uses a catheter.  Pt states she is currently being weaned off prednisone for Myasthenia Gravis.

## 2018-06-12 NOTE — ED Provider Notes (Signed)
[facility] Emergency Department Provider Note   ____________________________________________    I have reviewed the triage vital signs and the nursing notes.   HISTORY  Chief Complaint Migraine and Fatigue     HPI [name] is a 32 y.o. female who presents with complaints of migraine headache which she describes as a throbbing pain bilaterally which has been ongoing for 3 days.  She has a history of migraines and this feels similar.  Denies neuro deficits.  No fevers chills or neck pain.  Also feels quite fatigued and reports a history of diabetes and frequent urinary tract infections.  She is tapering down prednisone dose for myasthenia gravis as well.  She is concerned that she may have a urinary tract infection.   Past Medical History:  Diagnosis Date  . BV (bacterial vaginosis)   . Chronic kidney disease    Frequent UTI  . Diabetes mellitus without complication (HCC)   . DUB (dysfunctional uterine bleeding)   . E. coli UTI (urinary tract infection)   . GERD (gastroesophageal reflux disease)   . H/O recurrent urinary tract infection   . H/O sickle cell trait   . Heavy periods   . Hypertension   . Hypokalemia   . Myasthenia gravis (HCC)   . Myasthenia gravis (HCC)   . Pneumonia January 12, 2016   [facility]  . PONV (postoperative nausea and vomiting)   . Psoriasis   . Spina bifida (HCC)   . Trichimoniasis   . Vaginal discharge   . Yeast infection     Patient Active Problem List   Diagnosis Date Noted  . Sickle cell trait (HCC) 01/10/2018  . Cerebral palsy (HCC) 12/12/2016  . Class 2 obesity 12/12/2016  . Encounter for surveillance of contraceptive pills 12/12/2016  . Cholecystitis 02/02/2016  . Sepsis (HCC) 01/12/2016  . Absolute anemia 08/17/2015  . Hay fever 05/17/2015  . Can't get food down 05/17/2015  . Secondary diabetes mellitus (HCC) 03/15/2015  . Other long term (current) drug therapy 04/20/2014  . Long term current  use of systemic steroids 04/20/2014  . Controlled type 2 diabetes mellitus without complication (HCC) 03/17/2014  . Clinical depression 01/19/2014  . Adenopathy 01/19/2014  . CN (constipation) 08/12/2013  . Spina bifida (HCC) 08/12/2013  . Esophagitis, reflux 10/02/2011  . Avitaminosis D 10/02/2011  . Essential (primary) hypertension 05/15/2011  . Myasthenia gravis (HCC) 12/06/2010    Past Surgical History:  Procedure Laterality Date  . ADENOIDECTOMY    . BACK SURGERY    . CHOLECYSTECTOMY N/A 02/02/2016   Procedure: LAPAROSCOPIC CHOLECYSTECTOMY WITH CHOLANGIOGRAM;  Surgeon: Leafy Ro, MD;  Location: [facility] ORS;  Service: General;  Laterality: N/A;  . DILATION AND CURETTAGE OF UTERUS    . EXCISION NECK NODULE Left December 09, 2015   [facility]  . EYE SURGERY Right   . FOOT SURGERY Bilateral    [facility]  . HYSTEROSCOPY W/D&C  2014   benign endometrial polyps  . TONSILLECTOMY      Prior to Admission medications   Medication Sig Start Date End Date Taking? Authorizing Provider  lisinopril (PRINIVIL,ZESTRIL) 10 MG tablet take 2 pills once a day. 02/26/18  Yes Arnaldo Natal, MD  loratadine (CLARITIN) 10 MG tablet Take 10 mg by mouth daily.   Yes [provider]  metFORMIN (GLUCOPHAGE) 500 MG tablet Take 500 mg by mouth 2 (two) times daily with a meal.   Yes [provider]  mycophenolate (CELLCEPT) 500 MG tablet Take 1,500 mg by mouth 2 (two) times daily.   Yes [provider]  norethindrone-ethinyl estradiol (JUNEL FE 1/20) 1-20 MG-MCG tablet Take 1 tablet by mouth daily. 01/10/18  Yes Defrancesco, Prentice DockerMartin A, MD  potassium chloride SA (K-DUR,KLOR-CON) 20 MEQ tablet Take 20 mEq by mouth daily.   Yes [provider]  predniSONE (DELTASONE) 5 MG tablet Take 5 mg by mouth every other day.    Yes [provider]  pyridostigmine (MESTINON) 60 MG tablet Take 60 mg by mouth every 4 (four) hours.   Yes [provider]    ranitidine (ZANTAC) 150 MG tablet Take 150 mg by mouth 2 (two) times daily.   Yes [provider]  VESICARE 10 MG tablet Take 20 tablets by mouth daily.  11/25/15  Yes [provider]  butalbital-acetaminophen-caffeine (FIORICET, ESGIC) 50-325-40 MG tablet Take 1-2 tablets by mouth every 6 (six) hours as needed for headache. 06/12/18 06/12/19  Jene EveryKinner, Rosario Kushner, MD  cyclobenzaprine (FLEXERIL) 10 MG tablet Take 1 tablet (10 mg total) by mouth 3 (three) times daily as needed for muscle spasms. Patient not taking: Reported on 06/12/2018 01/29/18   Kem Boroughsriplett, Cari B, FNP  HYDROcodone-acetaminophen (NORCO/VICODIN) 5-325 MG tablet Take 1 tablet by mouth every 4 (four) hours as needed for moderate pain. Patient not taking: Reported on 06/12/2018 01/29/18 01/29/19  Chinita Pesterriplett, Cari B, FNP  phenazopyridine (PYRIDIUM) 200 MG tablet Take 1 tablet (200 mg total) by mouth 3 (three) times daily as needed for pain. pharmacist please check and make sure patient who has myasthenia can take Pyridium. I have checked in up-to-date and cannot find any contraindications but please check in your systems. Patient not taking: Reported on 06/12/2018 02/26/18 02/26/19  Arnaldo NatalMalinda, Paul F, MD  sulfamethoxazole-trimethoprim (BACTRIM DS,SEPTRA DS) 800-160 MG tablet Take 1 tablet by mouth 2 (two) times daily. 06/12/18   Jene EveryKinner, Stevee Valenta, MD     Allergies Penicillins; Contrast media [iodinated diagnostic agents]; and Latex  Family History  Adopted: Yes  Family history unknown: Yes    Social History Social History   Tobacco Use  . Smoking status: Never Smoker  . Smokeless tobacco: Never Used  Substance Use Topics  . Alcohol use: No    Alcohol/week: 0.0 oz  . Drug use: No    Review of Systems  Constitutional: No fever/chills Eyes: No visual changes.  ENT: No pain Cardiovascular: Denies chest pain. Respiratory: Denies shortness of breath. Gastrointestinal: No abdominal pain.   Genitourinary: Negative for  dysuria. Musculoskeletal: Negative for back pain. Skin: Negative for rash. Neurological: As above   ____________________________________________   PHYSICAL EXAM:  VITAL SIGNS: ED Triage Vitals  Enc Vitals Group     BP 06/12/18 1035 (!) 135/98     Pulse Rate 06/12/18 1035 88     Resp 06/12/18 1035 18     Temp 06/12/18 1035 98 F (36.7 C)     Temp Source 06/12/18 1035 Oral     SpO2 06/12/18 1035 99 %     Weight 06/12/18 1049 88 kg (194 lb)     Height 06/12/18 1049 1.524 m (5')     Head Circumference --      Peak Flow --      Pain Score 06/12/18 1049 9     Pain Loc --      Pain Edu? --      Excl. in GC? --     Constitutional: Alert and oriented. No acute distress. Pleasant and interactive  Eyes: Conjunctivae are normal.  PERRLA  Nose: No congestion/rhinnorhea. Mouth/Throat: Mucous membranes are moist.    Cardiovascular: Normal rate, regular rhythm.   Good peripheral circulation. Respiratory: Normal respiratory effort.  No retractions.  Gastrointestinal: Soft and nontender. No distention.   Musculoskeletal:   Warm and well perfused Neurologic:  Normal speech and language. No gross focal neurologic deficits are appreciated.  Skin:  Skin is warm, dry and intact. No rash noted. Psychiatric: Mood and affect are normal. Speech and behavior are normal.  ____________________________________________   LABS (all labs ordered are listed, but only abnormal results are displayed)  Labs Reviewed  URINALYSIS, COMPLETE (UACMP) WITH MICROSCOPIC - Abnormal; Notable for the following components:      Result Value   Color, Urine AMBER (*)    APPearance CLOUDY (*)    Hgb urine dipstick SMALL (*)    Ketones, ur 20 (*)    Nitrite POSITIVE (*)    Leukocytes, UA MODERATE (*)    WBC, UA >50 (*)    Bacteria, UA MANY (*)    All other components within normal limits  CBC - Abnormal; Notable for the following components:   WBC 12.7 (*)    All other components within normal limits   BASIC METABOLIC PANEL - Abnormal; Notable for the following components:   Sodium 134 (*)    Glucose, Bld 114 (*)    Calcium 8.7 (*)    All other components within normal limits  URINE CULTURE  GLUCOSE, CAPILLARY  POC URINE PREG, ED  CBG MONITORING, ED  POCT PREGNANCY, URINE   ____________________________________________  EKG  None ____________________________________________  RADIOLOGY  None ____________________________________________   PROCEDURES  Procedure(s) performed: No  Procedures   Critical Care performed: No ____________________________________________   INITIAL IMPRESSION / ASSESSMENT AND PLAN / ED COURSE  Pertinent labs & imaging results that were available during my care of the patient were reviewed by me and considered in my medical decision making (see chart for details).  She overall well-appearing in no acute distress.  Lab work is significant for mildly elevated white blood cell count, this could be related to prednisone use or due to her urinary tract infection.  We will treat migraine with IV Reglan, IV Toradol, IV Benadryl and also give her a dose of IV Cipro for her urinary tract infection.  ----------------------------------------- 2:10 PM on 06/12/2018 -----------------------------------------  Patient reports her headache has resolved after treatment.  She would like to go home, she requests Bactrim for UTI as she states this works better for her.    ____________________________________________   FINAL CLINICAL IMPRESSION(S) / ED DIAGNOSES  Final diagnoses:  Migraine without status migrainosus, not intractable, unspecified migraine type  Lower urinary tract infectious disease        Note:  This document was prepared using Dragon voice recognition software and may include unintentional dictation errors.    Jene Every, MD 06/12/18 1410

## 2018-06-12 NOTE — ED Notes (Addendum)
Pt provided warm blanket. Family at bedside. Pt eating food family brought.

## 2018-06-14 LAB — URINE CULTURE

## 2018-06-19 ENCOUNTER — Other Ambulatory Visit: Payer: Self-pay

## 2018-06-19 ENCOUNTER — Emergency Department: Payer: Medicaid Other

## 2018-06-19 ENCOUNTER — Emergency Department
Admission: EM | Admit: 2018-06-19 | Discharge: 2018-06-19 | Disposition: A | Discharge status: Home / Self Care | Payer: Medicaid Other | Attending: Emergency Medicine | Admitting: Emergency Medicine

## 2018-06-19 DIAGNOSIS — I10 Essential (primary) hypertension: Secondary | ICD-10-CM | POA: Diagnosis not present

## 2018-06-19 DIAGNOSIS — Z7984 Long term (current) use of oral hypoglycemic drugs: Secondary | ICD-10-CM | POA: Insufficient documentation

## 2018-06-19 DIAGNOSIS — H5712 Ocular pain, left eye: Secondary | ICD-10-CM | POA: Diagnosis not present

## 2018-06-19 DIAGNOSIS — E119 Type 2 diabetes mellitus without complications: Secondary | ICD-10-CM | POA: Insufficient documentation

## 2018-06-19 DIAGNOSIS — R51 Headache: Secondary | ICD-10-CM | POA: Diagnosis present

## 2018-06-19 MED ORDER — KETOROLAC TROMETHAMINE 0.5 % OP SOLN: 1.0000 [drp] | Freq: Four times a day (QID) | OPHTHALMIC | 0 refills | Status: DC

## 2018-06-19 MED ORDER — ERYTHROMYCIN 5 MG/GM OP OINT
TOPICAL_OINTMENT | Freq: Once | OPHTHALMIC | Status: AC
  Administered 2018-06-19: 1 via OPHTHALMIC
  Filled 2018-06-19: qty 1

## 2018-06-19 MED ORDER — TETRACAINE HCL 0.5 % OP SOLN
1.0000 [drp] | Freq: Once | OPHTHALMIC | Status: AC
  Administered 2018-06-19: 1 [drp] via OPHTHALMIC

## 2018-06-19 MED ORDER — FLUORESCEIN SODIUM 1 MG OP STRP
ORAL_STRIP | OPHTHALMIC | Status: AC
  Filled 2018-06-19: qty 1

## 2018-06-19 MED ORDER — ERYTHROMYCIN 5 MG/GM OP OINT: 1.0000 "application " | TOPICAL_OINTMENT | Freq: Four times a day (QID) | OPHTHALMIC | 0 refills | Status: DC

## 2018-06-19 MED ORDER — TETRACAINE HCL 0.5 % OP SOLN
OPHTHALMIC | Status: AC
  Administered 2018-06-19: 1 [drp] via OPHTHALMIC
  Filled 2018-06-19: qty 4

## 2018-06-19 NOTE — ED Provider Notes (Signed)
[facility] Emergency Department Provider Note   ____________________________________________   First MD Initiated Contact with Patient 06/19/18 1748     (approximate)  I have reviewed the triage vital signs and the nursing notes.   HISTORY  Chief Complaint Eye Pain   HPI [name] is a 32 y.o. female who presents to the emergency department for evaluation and treatment of left and facial pain.  On Sunday, she states that she fell in the bathtub and struck that side of her face.  She states that she has some swelling to the left side of her forehead, left cheek, and left eye but that went away after she applied some ice.  She states that on Monday, she was struck in the same and same side of the face with a baseball.  Since that time, she has had consistent pain behind the eye and the is red and irritated. No alleviating measures have been attempted for this complaint prior to arrival.  Past Medical History:  Diagnosis Date  . BV (bacterial vaginosis)   . Chronic kidney disease    Frequent UTI  . Diabetes mellitus without complication (HCC)   . DUB (dysfunctional uterine bleeding)   . E. coli UTI (urinary tract infection)   . GERD (gastroesophageal reflux disease)   . H/O recurrent urinary tract infection   . H/O sickle cell trait   . Heavy periods   . Hypertension   . Hypokalemia   . Myasthenia gravis (HCC)   . Myasthenia gravis (HCC)   . Pneumonia January 12, 2016   [facility]  . PONV (postoperative nausea and vomiting)   . Psoriasis   . Spina bifida (HCC)   . Trichimoniasis   . Vaginal discharge   . Yeast infection     Patient Active Problem List   Diagnosis Date Noted  . Sickle cell trait (HCC) 01/10/2018  . Cerebral palsy (HCC) 12/12/2016  . Class 2 obesity 12/12/2016  . Encounter for surveillance of contraceptive pills 12/12/2016  . Cholecystitis 02/02/2016  . Sepsis (HCC) 01/12/2016  . Absolute anemia 08/17/2015  . Hay fever  05/17/2015  . Can't get food down 05/17/2015  . Secondary diabetes mellitus (HCC) 03/15/2015  . Other long term (current) drug therapy 04/20/2014  . Long term current use of systemic steroids 04/20/2014  . Controlled type 2 diabetes mellitus without complication (HCC) 03/17/2014  . Clinical depression 01/19/2014  . Adenopathy 01/19/2014  . CN (constipation) 08/12/2013  . Spina bifida (HCC) 08/12/2013  . Esophagitis, reflux 10/02/2011  . Avitaminosis D 10/02/2011  . Essential (primary) hypertension 05/15/2011  . Myasthenia gravis (HCC) 12/06/2010    Past Surgical History:  Procedure Laterality Date  . ADENOIDECTOMY    . BACK SURGERY    . CHOLECYSTECTOMY N/A 02/02/2016   Procedure: LAPAROSCOPIC CHOLECYSTECTOMY WITH CHOLANGIOGRAM;  Surgeon: Leafy Ro, MD;  Location: [facility] ORS;  Service: General;  Laterality: N/A;  . DILATION AND CURETTAGE OF UTERUS    . EXCISION NECK NODULE Left December 09, 2015   [facility]  . EYE SURGERY Right   . FOOT SURGERY Bilateral    [facility]  . HYSTEROSCOPY W/D&C  2014   benign endometrial polyps  . TONSILLECTOMY      Prior to Admission medications   Medication Sig Start Date End Date Taking? Authorizing Provider  butalbital-acetaminophen-caffeine (FIORICET, ESGIC) [id number] MG tablet Take 1-2 tablets by mouth every 6 (six) hours as needed for headache. 06/12/18 06/12/19  Jene Every,  MD  cyclobenzaprine (FLEXERIL) 10 MG tablet Take 1 tablet (10 mg total) by mouth 3 (three) times daily as needed for muscle spasms. Patient not taking: Reported on 06/12/2018 01/29/18   Kem Boroughs B, FNP  erythromycin ophthalmic ointment Place 1 application into the left eye 4 (four) times daily. 06/19/18   Amillia Biffle, Rulon Eisenmenger B, FNP  HYDROcodone-acetaminophen (NORCO/VICODIN) 5-325 MG tablet Take 1 tablet by mouth every 4 (four) hours as needed for moderate pain. Patient not taking: Reported on 06/12/2018 01/29/18 01/29/19  Chinita Pester, FNP  ketorolac (ACULAR)  0.5 % ophthalmic solution Place 1 drop into the left eye 4 (four) times daily. 06/19/18   Darnisha Vernet B, FNP  lisinopril (PRINIVIL,ZESTRIL) 10 MG tablet take 2 pills once a day. 02/26/18   Arnaldo Natal, MD  loratadine (CLARITIN) 10 MG tablet Take 10 mg by mouth daily.    [provider]  metFORMIN (GLUCOPHAGE) 500 MG tablet Take 500 mg by mouth 2 (two) times daily with a meal.    [provider]  mycophenolate (CELLCEPT) 500 MG tablet Take 1,500 mg by mouth 2 (two) times daily.    [provider]  norethindrone-ethinyl estradiol (JUNEL FE 1/20) 1-20 MG-MCG tablet Take 1 tablet by mouth daily. 01/10/18   Defrancesco, Prentice Docker, MD  phenazopyridine (PYRIDIUM) 200 MG tablet Take 1 tablet (200 mg total) by mouth 3 (three) times daily as needed for pain. pharmacist please check and make sure patient who has myasthenia can take Pyridium. I have checked in up-to-date and cannot find any contraindications but please check in your systems. Patient not taking: Reported on 06/12/2018 02/26/18 02/26/19  Arnaldo Natal, MD  potassium chloride SA (K-DUR,KLOR-CON) 20 MEQ tablet Take 20 mEq by mouth daily.    [provider]  predniSONE (DELTASONE) 5 MG tablet Take 5 mg by mouth every other day.     [provider]  pyridostigmine (MESTINON) 60 MG tablet Take 60 mg by mouth every 4 (four) hours.    [provider]  ranitidine (ZANTAC) 150 MG tablet Take 150 mg by mouth 2 (two) times daily.    [provider]  sulfamethoxazole-trimethoprim (BACTRIM DS,SEPTRA DS) 800-160 MG tablet Take 1 tablet by mouth 2 (two) times daily. 06/12/18   Jene Every, MD  VESICARE 10 MG tablet Take 20 tablets by mouth daily.  11/25/15   [provider]    Allergies Penicillins; Contrast media [iodinated diagnostic agents]; and Latex  Family History  Adopted: Yes  Family history unknown: Yes    Social History Social History   Tobacco Use  . Smoking status:  Never Smoker  . Smokeless tobacco: Never Used  Substance Use Topics  . Alcohol use: No    Alcohol/week: 0.0 oz  . Drug use: No    Review of Systems  Constitutional: No fever/chills Eyes: No visual changes.  Left eye pain.   ENT: No sore throat. Cardiovascular: Denies chest pain. Respiratory: Denies shortness of breath. Gastrointestinal: No abdominal pain.  No nausea, no vomiting.   Genitourinary: Negative for dysuria. Musculoskeletal: Negative for back pain. Skin: Negative for rash.  Negative for contusion or open wound Neurological: Negative for focal weakness or numbness. ____________________________________________   PHYSICAL EXAM:  VITAL SIGNS: ED Triage Vitals  Enc Vitals Group     BP 06/19/18 1733 128/70     Pulse Rate 06/19/18 1733 78     Resp 06/19/18 1733 18     Temp 06/19/18 1733 98.5 F (36.9 C)  Temp Source 06/19/18 1733 Oral     SpO2 06/19/18 1733 98 %     Weight 06/19/18 1734 194 lb (88 kg)     Height 06/19/18 1734 5' (1.524 m)     Head Circumference --      Peak Flow --      Pain Score 06/19/18 1734 9     Pain Loc --      Pain Edu? --      Excl. in GC? --     Constitutional: Alert and oriented. Well appearing and in no acute distress. Eyes: Conjunctivae are normal. PERRL. No diplopia. No hyphema. Fluorescein stain exam of the left eye does not reveal any corneal abrasion.  There is no diet uptake in the conjunctival surface.  There is no obvious retained foreign body.  Pressure assessed with the Tono-Pen shows readings of 21, 26, 24, and 27.  There is erythema/ecchymosis on the conjunctival surface on the left side that ranges from approximately the 3:00 to the 5 o'clock position. Head: Atraumatic. Nose: No congestion/rhinnorhea. Mouth/Throat: Mucous membranes are moist.  Oropharynx non-erythematous. Neck: No stridor.   Cardiovascular: Normal rate, regular rhythm. Grossly normal heart sounds.  Good peripheral circulation.  Respiratory: Normal  respiratory effort.  No retractions. Lungs CTAB. Gastrointestinal: Soft and nontender. No distention. No abdominal bruits. No CVA tenderness. Musculoskeletal: No lower extremity tenderness nor edema.  No joint effusions. Neurologic:  Normal speech and language. No gross focal neurologic deficits are appreciated. No gait instability. Skin:  Skin is warm, dry and intact. No rash noted. Psychiatric: Mood and affect are normal. Speech and behavior are normal.  ____________________________________________   LABS (all labs ordered are listed, but only abnormal results are displayed)  Labs Reviewed - No data to display ____________________________________________  EKG  Not indicated ____________________________________________  RADIOLOGY  Official radiology report(s): Ct Maxillofacial Wo Contrast  Result Date: 06/19/2018 CLINICAL DATA:  Initial evaluation for recent left-sided facial trauma, left-sided eye swelling and pain. EXAM: CT MAXILLOFACIAL WITHOUT CONTRAST TECHNIQUE: Multidetector CT imaging of the maxillofacial structures was performed. Multiplanar CT image reconstructions were also generated. COMPARISON:  None. FINDINGS: Osseous: Zygomatic arches intact. No acute maxillary fracture. Pterygoid plates intact. Nasal bones intact. Nasal septum deviated to the right but intact. No acute mandibular fracture. Mandibular condyles normally situated. No acute abnormality about the dentition. Orbits: Globes and orbital soft tissues within normal limits without acute injury. Bony orbits intact. Sinuses: Mild mucosal thickening within the maxillary sinuses bilaterally. Paranasal sinuses are otherwise clear. Mastoid air cells and middle ear cavities are well pneumatized and free of fluid. Soft tissues: No appreciable soft tissue injury about the face. Limited intracranial: Unremarkable. IMPRESSION: No acute maxillofacial injury identified. Electronically Signed   By: Rise MuBenjamin  McClintock M.D.   On:  06/19/2018 19:28    ____________________________________________   PROCEDURES  Procedure(s) performed: None  Procedures  Critical Care performed: No  ____________________________________________   INITIAL IMPRESSION / ASSESSMENT AND PLAN / ED COURSE  As part of my medical decision making, I reviewed the following data within the electronic MEDICAL RECORD NUMBER    32 year old female presenting to the emergency department for treatment and evaluation of left eye pain and headache after a fall on Sunday and then a subsequent strike with a baseball on Monday.  No obvious corneal abrasion, however there is an erythematous/ecchymotic area in the conjunctiva which will be covered with erythromycin empirically.  She will also be given a prescription for Acular to help with the pain.  She is to call in the morning to schedule follow-up appointment with ophthalmology and a referral name and number was provided on the discharge papers.  She is to return to the emergency department for any vision changes or acute changes if she is unable to see ophthalmology right away.      ____________________________________________   FINAL CLINICAL IMPRESSION(S) / ED DIAGNOSES  Final diagnoses:  Left eye pain     ED Discharge Orders        Ordered    ketorolac (ACULAR) 0.5 % ophthalmic solution  4 times daily,   Status:  Discontinued     06/19/18 1957    erythromycin ophthalmic ointment  4 times daily,   Status:  Discontinued     06/19/18 1957    erythromycin ophthalmic ointment  4 times daily     06/19/18 2001    ketorolac (ACULAR) 0.5 % ophthalmic solution  4 times daily     06/19/18 2001       Note:  This document was prepared using Dragon voice recognition software and may include unintentional dictation errors.    Chinita Pester, FNP 06/19/18 2032    Rockne Menghini, MD 06/20/18 [phone]

## 2018-06-19 NOTE — ED Notes (Signed)
See triage note  Presents with pain to left eye and side of face  States she fell   Hit face  Drainage from eye today with some redness

## 2018-06-19 NOTE — ED Triage Notes (Signed)
Pt states she fell this weekend. Hit L side of head on tub. Got up on own after fall. States L sided eye swelling from it, has been using ice packs. Redness noticed to eye. States sensitive to light. C/o eye drainage. L eye. States feels like pressure in eye.   Alert, oriented, ambulatory. No distress noted. Denies blood thinner use. Has been taking fiorcet at home for migraine and bactrim for UTI.

## 2018-06-19 NOTE — Discharge Instructions (Signed)
Call the eye doctor tomorrow to get an appointment. Return to the ER for symptoms that change or worsen if unable to see your primary care provider or the eye specialist.

## 2018-08-12 DIAGNOSIS — R29898 Other symptoms and signs involving the musculoskeletal system: Secondary | ICD-10-CM | POA: Insufficient documentation

## 2018-08-22 ENCOUNTER — Other Ambulatory Visit: Payer: Self-pay

## 2018-08-22 ENCOUNTER — Emergency Department: Payer: Medicaid Other

## 2018-08-22 ENCOUNTER — Emergency Department
Admission: EM | Admit: 2018-08-22 | Discharge: 2018-08-22 | Disposition: A | Discharge status: Home / Self Care | Payer: Medicaid Other | Attending: Emergency Medicine | Admitting: Emergency Medicine

## 2018-08-22 ENCOUNTER — Encounter: Payer: Self-pay | Admitting: Emergency Medicine

## 2018-08-22 DIAGNOSIS — Y939 Activity, unspecified: Secondary | ICD-10-CM | POA: Insufficient documentation

## 2018-08-22 DIAGNOSIS — I10 Essential (primary) hypertension: Secondary | ICD-10-CM | POA: Insufficient documentation

## 2018-08-22 DIAGNOSIS — H7291 Unspecified perforation of tympanic membrane, right ear: Secondary | ICD-10-CM | POA: Diagnosis not present

## 2018-08-22 DIAGNOSIS — S6991XA Unspecified injury of right wrist, hand and finger(s), initial encounter: Secondary | ICD-10-CM | POA: Diagnosis not present

## 2018-08-22 DIAGNOSIS — Z79899 Other long term (current) drug therapy: Secondary | ICD-10-CM | POA: Insufficient documentation

## 2018-08-22 DIAGNOSIS — T07XXXA Unspecified multiple injuries, initial encounter: Secondary | ICD-10-CM | POA: Insufficient documentation

## 2018-08-22 DIAGNOSIS — Y929 Unspecified place or not applicable: Secondary | ICD-10-CM | POA: Insufficient documentation

## 2018-08-22 DIAGNOSIS — Z91040 Latex allergy status: Secondary | ICD-10-CM | POA: Diagnosis not present

## 2018-08-22 DIAGNOSIS — E119 Type 2 diabetes mellitus without complications: Secondary | ICD-10-CM | POA: Diagnosis not present

## 2018-08-22 DIAGNOSIS — Y999 Unspecified external cause status: Secondary | ICD-10-CM | POA: Diagnosis not present

## 2018-08-22 DIAGNOSIS — R51 Headache: Secondary | ICD-10-CM | POA: Diagnosis present
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
## 2018-10-14 ENCOUNTER — Encounter: Payer: Self-pay | Admitting: Emergency Medicine

## 2018-10-14 ENCOUNTER — Other Ambulatory Visit: Payer: Self-pay

## 2018-10-14 ENCOUNTER — Emergency Department
Admission: EM | Admit: 2018-10-14 | Discharge: 2018-10-14 | Disposition: A | Discharge status: Home / Self Care | Payer: Medicaid Other | Attending: Emergency Medicine | Admitting: Emergency Medicine

## 2018-10-14 DIAGNOSIS — S61451A Open bite of right hand, initial encounter: Secondary | ICD-10-CM | POA: Insufficient documentation

## 2018-10-14 DIAGNOSIS — Z7984 Long term (current) use of oral hypoglycemic drugs: Secondary | ICD-10-CM | POA: Diagnosis not present

## 2018-10-14 DIAGNOSIS — W503XXA Accidental bite by another person, initial encounter: Secondary | ICD-10-CM | POA: Insufficient documentation

## 2018-10-14 DIAGNOSIS — Y9389 Activity, other specified: Secondary | ICD-10-CM | POA: Insufficient documentation

## 2018-10-14 DIAGNOSIS — Z79899 Other long term (current) drug therapy: Secondary | ICD-10-CM | POA: Diagnosis not present

## 2018-10-14 DIAGNOSIS — Y929 Unspecified place or not applicable: Secondary | ICD-10-CM | POA: Diagnosis not present

## 2018-10-14 DIAGNOSIS — N390 Urinary tract infection, site not specified: Secondary | ICD-10-CM | POA: Diagnosis not present

## 2018-10-14 DIAGNOSIS — N189 Chronic kidney disease, unspecified: Secondary | ICD-10-CM | POA: Diagnosis not present

## 2018-10-14 DIAGNOSIS — R319 Hematuria, unspecified: Secondary | ICD-10-CM | POA: Insufficient documentation

## 2018-10-14 DIAGNOSIS — Y999 Unspecified external cause status: Secondary | ICD-10-CM | POA: Insufficient documentation

## 2018-10-14 DIAGNOSIS — E1122 Type 2 diabetes mellitus with diabetic chronic kidney disease: Secondary | ICD-10-CM | POA: Diagnosis not present

## 2018-10-14 DIAGNOSIS — I129 Hypertensive chronic kidney disease with stage 1 through stage 4 chronic kidney disease, or unspecified chronic kidney disease: Secondary | ICD-10-CM | POA: Diagnosis not present

## 2018-10-14 DIAGNOSIS — Z91040 Latex allergy status: Secondary | ICD-10-CM | POA: Insufficient documentation

## 2018-10-14 DIAGNOSIS — S6991XA Unspecified injury of right wrist, hand and finger(s), initial encounter: Secondary | ICD-10-CM | POA: Diagnosis present

## 2018-10-14 LAB — URINALYSIS, COMPLETE (UACMP) WITH MICROSCOPIC
Bilirubin Urine: NEGATIVE
Glucose, UA: NEGATIVE mg/dL
Ketones, ur: 5 mg/dL — AB
NITRITE: POSITIVE — AB
Protein, ur: NEGATIVE mg/dL
SPECIFIC GRAVITY, URINE: 1.008 (ref 1.005–1.030)
WBC, UA: >50 WBC/hpf — ABNORMAL HIGH (ref 0–5)
pH: 5 (ref 5.0–8.0)

## 2018-10-14 LAB — PREGNANCY, URINE: PREG TEST UR: NEGATIVE

## 2018-10-14 MED ORDER — SULFAMETHOXAZOLE-TRIMETHOPRIM 800-160 MG PO TABS: 1.0000 | ORAL_TABLET | Freq: Two times a day (BID) | ORAL | 0 refills | Status: DC

## 2018-10-14 MED ORDER — SULFAMETHOXAZOLE-TRIMETHOPRIM 800-160 MG PO TABS
1.0000 | ORAL_TABLET | Freq: Once | ORAL | Status: AC
  Administered 2018-10-14: 1 via ORAL
  Filled 2018-10-14: qty 1
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
## 2018-10-14 NOTE — ED Notes (Signed)
Pt states she has taken septra in the past, no concern of side effects.
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
## 2018-10-14 NOTE — ED Notes (Signed)
Pt reports she is a hard stick and would prefer to have IV placed if doing labs, informed her once she saw the doctor, lab work and IV could be ordered if necessary.

## 2018-10-29 ENCOUNTER — Emergency Department
Admission: EM | Admit: 2018-10-29 | Discharge: 2018-10-29 | Disposition: A | Discharge status: Home / Self Care | Payer: Medicaid Other | Attending: Emergency Medicine | Admitting: Emergency Medicine

## 2018-10-29 ENCOUNTER — Other Ambulatory Visit: Payer: Self-pay

## 2018-10-29 DIAGNOSIS — Z91040 Latex allergy status: Secondary | ICD-10-CM | POA: Diagnosis not present

## 2018-10-29 DIAGNOSIS — Z7984 Long term (current) use of oral hypoglycemic drugs: Secondary | ICD-10-CM | POA: Insufficient documentation

## 2018-10-29 DIAGNOSIS — I10 Essential (primary) hypertension: Secondary | ICD-10-CM | POA: Diagnosis not present

## 2018-10-29 DIAGNOSIS — G43909 Migraine, unspecified, not intractable, without status migrainosus: Secondary | ICD-10-CM | POA: Diagnosis not present

## 2018-10-29 DIAGNOSIS — Z79899 Other long term (current) drug therapy: Secondary | ICD-10-CM | POA: Insufficient documentation

## 2018-10-29 DIAGNOSIS — E119 Type 2 diabetes mellitus without complications: Secondary | ICD-10-CM | POA: Diagnosis not present

## 2018-10-29 DIAGNOSIS — R51 Headache: Secondary | ICD-10-CM | POA: Diagnosis present

## 2018-10-29 LAB — GLUCOSE, CAPILLARY: GLUCOSE-CAPILLARY: 108 mg/dL — AB (ref 70–99)

## 2018-10-29 MED ORDER — DIPHENHYDRAMINE HCL 50 MG/ML IJ SOLN
25.0000 mg | Freq: Once | INTRAMUSCULAR | Status: AC
  Administered 2018-10-29: 25 mg via INTRAVENOUS
  Filled 2018-10-29: qty 1

## 2018-10-29 MED ORDER — SODIUM CHLORIDE 0.9 % IV BOLUS
1000.0000 mL | Freq: Once | INTRAVENOUS | Status: AC
  Administered 2018-10-29: 1000 mL via INTRAVENOUS

## 2018-10-29 MED ORDER — METOCLOPRAMIDE HCL 5 MG/ML IJ SOLN
10.0000 mg | Freq: Once | INTRAMUSCULAR | Status: AC
  Administered 2018-10-29: 10 mg via INTRAVENOUS
  Filled 2018-10-29: qty 2

## 2018-10-29 MED ORDER — KETOROLAC TROMETHAMINE 30 MG/ML IJ SOLN
30.0000 mg | Freq: Once | INTRAMUSCULAR | Status: AC
  Administered 2018-10-29: 30 mg via INTRAVENOUS
  Filled 2018-10-29: qty 1

## 2018-10-29 NOTE — ED Notes (Signed)
POCT Glucose 108

## 2018-10-29 NOTE — Discharge Instructions (Signed)
Follow-up with your primary care provider if any continued problems with headaches.  Increase fluids.  Take Tylenol or ibuprofen as needed this evening.  Also tried to eat on a regular basis and get plenty of rest.

## 2018-10-29 NOTE — ED Triage Notes (Signed)
Pt c/o migraine for the past 3 days with a hx of migraines, states "I wish I had the medication at home so I wouldn't have to comes here for treatment".Marland Kitchen
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
## 2018-12-16 ENCOUNTER — Other Ambulatory Visit: Payer: Self-pay

## 2018-12-16 ENCOUNTER — Telehealth: Payer: Self-pay | Admitting: Surgery

## 2018-12-16 ENCOUNTER — Ambulatory Visit: Payer: Self-pay

## 2018-12-16 VITALS — BP 151/110 | HR 92 | Temp 97.9°F | Resp 16 | Ht 60.0 in | Wt 197.0 lb

## 2018-12-16 DIAGNOSIS — L02216 Cutaneous abscess of umbilicus: Secondary | ICD-10-CM

## 2018-12-16 NOTE — Telephone Encounter (Signed)
Patient is calling said her belly button has been draining, and has a odor to it, her drainage is a clear, but had a little bloode look to it this morning. Please call patient.

## 2018-12-16 NOTE — Telephone Encounter (Signed)
Patient states she has drainage coming from her navel.  She had Laparoscopic Cholecystectomy 02/02/16. Since her surgery this is the first episode. Clear drainage, but it tends to have a little odor. Denies fever or chills. Patient states she will come to the office so a nurse can look to see if there is a noticeable scratch or wound.

## 2018-12-19 ENCOUNTER — Encounter: Payer: Self-pay | Admitting: Surgery

## 2018-12-19 ENCOUNTER — Other Ambulatory Visit: Payer: Self-pay

## 2018-12-19 ENCOUNTER — Ambulatory Visit (INDEPENDENT_AMBULATORY_CARE_PROVIDER_SITE_OTHER): Payer: Medicaid Other | Admitting: Surgery

## 2018-12-19 VITALS — BP 171/110 | HR 86 | Temp 97.7°F | Resp 16 | Ht 60.0 in | Wt 199.6 lb

## 2018-12-19 DIAGNOSIS — L02216 Cutaneous abscess of umbilicus: Secondary | ICD-10-CM | POA: Diagnosis not present

## 2018-12-19 NOTE — Progress Notes (Signed)
Surgical Clinic Progress/Follow-up Note   HPI:  32 y.o. Female presents to clinic for evaluation of sharp, stabbing umbilical pain 2.5 years s/p laparoscopic cholecystectomy for acute on chronic cholecystitis (Pabon, 02/02/2016). Patient reports she in retrospect first noted a small amount of clear drainage from her umbilicus 2 months ago at the end of this past October, but only recently did she develop an increasingly foul odor with increased umbilical drainage and increasing pain, for which she requested surgical evaluation due to concern her symptoms could be related to her prior surgery. She was evaluated in office 3 days ago by nurse, who requested patient return for MD evaluation. Patient describes pain is constant without any associated bulge or erythema and not affected by eating. She also denies any constipation or diarrhea, N/V, CP, or SOB.  Review of Systems:  Constitutional: denies any other weight loss, fever, chills, or sweats  Eyes: denies any other vision changes, history of eye injury  ENT: denies sore throat, hearing problems  Respiratory: denies shortness of breath, wheezing  Cardiovascular: denies chest pain, palpitations  Gastrointestinal: abdominal pain, N/V, and bowel function as per HPI Musculoskeletal: denies any other joint pains or cramps  Skin: Denies any other rashes or skin discolorations  Neurological: denies any other headache, dizziness, weakness  Psychiatric: denies any other depression, anxiety  All other review of systems: otherwise negative   Vital Signs:  BP (!) 171/110   Pulse 86   Temp 97.7 F (36.5 C) (Temporal)   Resp 16   Ht 5' (1.524 m)   Wt 199 lb 9.6 oz (90.5 kg)   SpO2 94%   BMI 38.98 kg/m    Physical Exam:  Constitutional:  -- Obese body habitus  -- Awake, alert, and oriented x3  Eyes:  -- Pupils equally round and reactive to light  -- No scleral icterus  Ear, nose, throat:  -- No jugular venous distension  -- No nasal  drainage, bleeding Pulmonary:  -- No crackles -- Equal breath sounds bilaterally -- Breathing non-labored at rest Cardiovascular:  -- S1, S2 present  -- No pericardial rubs  Gastrointestinal:  -- Soft, nontender, non-distended, no guarding/rebound  -- Focal deep umbilical pain with a small amount of expressible purulent drainage -- No surrounding erythema and no peri-incisional supra-umbilical pain or swelling -- No abdominal masses appreciated, pulsatile or otherwise  Musculoskeletal / Integumentary:  -- Wounds or skin discoloration: None appreciated except as described above (GI)  -- Extremities: B/L UE and LE FROM, hands and feet warm, no edema  Neurologic:  -- Motor function: intact and symmetric  -- Sensation: intact and symmetric   Imaging: No new pertinent imaging available for review at this time   Assessment:  32 y.o. yo Female with a problem list including...  Patient Active Problem List   Diagnosis Date Noted  . Knee clicking 08/12/2018  . Sickle cell trait (HCC) 01/10/2018  . Neurogenic bladder 12/27/2016  . Neurogenic bowel 12/27/2016  . Cerebral palsy (HCC) 12/12/2016  . Class 2 obesity 12/12/2016  . Encounter for surveillance of contraceptive pills 12/12/2016  . Muscle twitch 10/30/2016  . S/P cholecystectomy 03/27/2016  . Cholecystitis 02/02/2016  . Sepsis (HCC) 01/12/2016  . Absolute anemia 08/17/2015  . Hay fever 05/17/2015  . Can't get food down 05/17/2015  . Secondary diabetes mellitus (HCC) 03/15/2015  . Other long term (current) drug therapy 04/20/2014  . Long term current use of systemic steroids 04/20/2014  . Controlled type 2 diabetes mellitus without complication (HCC)  03/17/2014  . Clinical depression 01/19/2014  . Adenopathy 01/19/2014  . CN (constipation) 08/12/2013  . Spina bifida (HCC) 08/12/2013  . Esophagitis, reflux 10/02/2011  . Avitaminosis D 10/02/2011  . Essential (primary) hypertension 05/15/2011  . Myasthenia gravis (HCC)  12/06/2010    presents to clinic for small umbilical cutaneous abscess, not associated with prior supra-umbilical surgical port site 2.5 years s/p laparoscopic cholecystectomy.  Plan:   - all risks, benefits, and alternatives to drainage of small abscess at the base of patient's umbilicus were discussed with the patient, all of her questions were answered to her expressed satisfaction, patient expressed she wished to proceed, and informed consent was obtained accordingly at that time  - drainage of small cutaneous abscess at base of umbilicus was performed with immediate relief from both associated pressure and pain  - no cultures were obtained nor any antibiotics prescribed due to very small size of umbilical abscess  - routine follow-up appointment was offered, but patient expresses preference to follow up as needed  - instructed to call office if any questions or concerns  All of the above recommendations were discussed with the patient, and all of patient's questions were answered to her expressed satisfaction.  -- Scherrie GerlachJason E. Earlene Plateravis, MD, RPVI [place]: [facility] General Surgery - Partnering for exceptional care. Office: [phone]

## 2018-12-19 NOTE — Patient Instructions (Signed)
Please call our office with any questions or concerns. 

## 2019-01-14 ENCOUNTER — Encounter: Payer: Medicaid Other | Admitting: Obstetrics and Gynecology

## 2019-06-08 ENCOUNTER — Inpatient Hospital Stay
Admission: EM | Admit: 2019-06-08 | Discharge: 2019-06-11 | DRG: 872 | Disposition: A | Discharge status: Home / Self Care | Payer: Medicaid Other | Attending: Internal Medicine | Admitting: Internal Medicine

## 2019-06-08 ENCOUNTER — Encounter: Payer: Self-pay | Admitting: Internal Medicine

## 2019-06-08 ENCOUNTER — Other Ambulatory Visit: Payer: Self-pay

## 2019-06-08 DIAGNOSIS — E119 Type 2 diabetes mellitus without complications: Secondary | ICD-10-CM

## 2019-06-08 DIAGNOSIS — Z7952 Long term (current) use of systemic steroids: Secondary | ICD-10-CM

## 2019-06-08 DIAGNOSIS — I129 Hypertensive chronic kidney disease with stage 1 through stage 4 chronic kidney disease, or unspecified chronic kidney disease: Secondary | ICD-10-CM | POA: Diagnosis present

## 2019-06-08 DIAGNOSIS — D573 Sickle-cell trait: Secondary | ICD-10-CM | POA: Diagnosis present

## 2019-06-08 DIAGNOSIS — Z79899 Other long term (current) drug therapy: Secondary | ICD-10-CM

## 2019-06-08 DIAGNOSIS — Q059 Spina bifida, unspecified: Secondary | ICD-10-CM

## 2019-06-08 DIAGNOSIS — A4151 Sepsis due to Escherichia coli [E. coli]: Principal | ICD-10-CM | POA: Diagnosis present

## 2019-06-08 DIAGNOSIS — K2100 Gastro-esophageal reflux disease with esophagitis, without bleeding: Secondary | ICD-10-CM | POA: Diagnosis present

## 2019-06-08 DIAGNOSIS — Z1159 Encounter for screening for other viral diseases: Secondary | ICD-10-CM

## 2019-06-08 DIAGNOSIS — I10 Essential (primary) hypertension: Secondary | ICD-10-CM | POA: Diagnosis present

## 2019-06-08 DIAGNOSIS — N189 Chronic kidney disease, unspecified: Secondary | ICD-10-CM | POA: Diagnosis present

## 2019-06-08 DIAGNOSIS — A419 Sepsis, unspecified organism: Secondary | ICD-10-CM | POA: Diagnosis present

## 2019-06-08 DIAGNOSIS — Z7984 Long term (current) use of oral hypoglycemic drugs: Secondary | ICD-10-CM

## 2019-06-08 DIAGNOSIS — K219 Gastro-esophageal reflux disease without esophagitis: Secondary | ICD-10-CM | POA: Diagnosis present

## 2019-06-08 DIAGNOSIS — G7000 Myasthenia gravis without (acute) exacerbation: Secondary | ICD-10-CM | POA: Diagnosis present

## 2019-06-08 DIAGNOSIS — N390 Urinary tract infection, site not specified: Secondary | ICD-10-CM | POA: Diagnosis present

## 2019-06-08 DIAGNOSIS — E1122 Type 2 diabetes mellitus with diabetic chronic kidney disease: Secondary | ICD-10-CM | POA: Diagnosis present

## 2019-06-08 LAB — CBC WITH DIFFERENTIAL/PLATELET
Abs Immature Granulocytes: 0.17 10*3/uL — ABNORMAL HIGH (ref 0.00–0.07)
Basophils Absolute: 0.1 10*3/uL (ref 0.0–0.1)
Basophils Relative: 0 %
Eosinophils Absolute: 0.1 10*3/uL (ref 0.0–0.5)
Eosinophils Relative: 1 %
HCT: 40.1 % (ref 36.0–46.0)
Hemoglobin: 13.2 g/dL (ref 12.0–15.0)
Immature Granulocytes: 1 %
Lymphocytes Relative: 10 %
Lymphs Abs: 1.2 10*3/uL (ref 0.7–4.0)
MCH: 27.2 pg (ref 26.0–34.0)
MCHC: 32.9 g/dL (ref 30.0–36.0)
MCV: 82.5 fL (ref 80.0–100.0)
Monocytes Absolute: 0.3 10*3/uL (ref 0.1–1.0)
Monocytes Relative: 2 %
Neutro Abs: 11 10*3/uL — ABNORMAL HIGH (ref 1.7–7.7)
Neutrophils Relative %: 86 %
Platelets: 271 10*3/uL (ref 150–400)
RBC: 4.86 MIL/uL (ref 3.87–5.11)
RDW: 13 % (ref 11.5–15.5)
WBC: 12.8 10*3/uL — ABNORMAL HIGH (ref 4.0–10.5)
nRBC: 0 % (ref 0.0–0.2)

## 2019-06-08 LAB — URINALYSIS, COMPLETE (UACMP) WITH MICROSCOPIC
Bilirubin Urine: NEGATIVE
Glucose, UA: 150 mg/dL — AB
Ketones, ur: NEGATIVE mg/dL
Nitrite: POSITIVE — AB
Protein, ur: 30 mg/dL — AB
Specific Gravity, Urine: 1.02 (ref 1.005–1.030)
WBC, UA: >50 WBC/hpf — ABNORMAL HIGH (ref 0–5)
pH: 5 (ref 5.0–8.0)

## 2019-06-08 LAB — LACTIC ACID, PLASMA
Lactic Acid, Venous: 2.2 mmol/L (ref 0.5–1.9)
Lactic Acid, Venous: 1.3 mmol/L (ref 0.5–1.9)

## 2019-06-08 LAB — COMPREHENSIVE METABOLIC PANEL
ALT: 21 U/L (ref 0–44)
AST: 19 U/L (ref 15–41)
Albumin: 3.9 g/dL (ref 3.5–5.0)
Alkaline Phosphatase: 62 U/L (ref 38–126)
Anion gap: 13 (ref 5–15)
BUN: 12 mg/dL (ref 6–20)
CO2: 23 mmol/L (ref 22–32)
Calcium: 8.8 mg/dL — ABNORMAL LOW (ref 8.9–10.3)
Chloride: 102 mmol/L (ref 98–111)
Creatinine, Ser: 0.75 mg/dL (ref 0.44–1.00)
GFR calc Af Amer: >60 mL/min (ref >60)
GFR calc non Af Amer: >60 mL/min (ref >60)
Glucose, Bld: 140 mg/dL — ABNORMAL HIGH (ref 70–99)
Potassium: 3.8 mmol/L (ref 3.5–5.1)
Sodium: 138 mmol/L (ref 135–145)
Total Bilirubin: 0.8 mg/dL (ref 0.3–1.2)
Total Protein: 7.2 g/dL (ref 6.5–8.1)

## 2019-06-08 MED ORDER — SODIUM CHLORIDE 0.9 % IV BOLUS
1000.0000 mL | Freq: Once | INTRAVENOUS | Status: AC
  Administered 2019-06-08: 1000 mL via INTRAVENOUS

## 2019-06-08 MED ORDER — CEFTRIAXONE SODIUM 1 G IJ SOLR
1.0000 g | Freq: Once | INTRAMUSCULAR | Status: AC
  Administered 2019-06-08: 1 g via INTRAVENOUS
  Filled 2019-06-08: qty 10

## 2019-06-08 NOTE — ED Triage Notes (Addendum)
Pt states she believes she has a UTI. Pt states has foul odor to urine, fever. Pt states she has chills. Pt has taken no antipyretics. Pt denies back pain. Pt states she has MG and spinal bifida.

## 2019-06-08 NOTE — ED Provider Notes (Signed)
[facility] Emergency Department Provider Note  ____________________________________________   I have reviewed the triage vital signs and the nursing notes.   HISTORY  Chief Complaint Fever and Dysuria   History limited by: Not Limited   HPI [name] is a 33 y.o. female who presents to the emergency department today because of concern for possible UTI. The patient states that she gets UTIs frequently. Started having some discomfort with urination last night. Continued today. She then started feeling like she was getting fevers and chills this evening. She denies any weakness, change in vision or shortness of breath.   Records reviewed. Per medical record review patient has a history of admission for MG exacerbation secondary to UTI 3 months ago. Frequent UTIs.   Past Medical History:  Diagnosis Date  . BV (bacterial vaginosis)   . Chronic kidney disease    Frequent UTI  . Diabetes mellitus without complication (HCC)   . DUB (dysfunctional uterine bleeding)   . E. coli UTI (urinary tract infection)   . GERD (gastroesophageal reflux disease)   . H/O recurrent urinary tract infection   . H/O sickle cell trait   . Heavy periods   . Hypertension   . Hypokalemia   . Myasthenia gravis (HCC)   . Myasthenia gravis (HCC)   . [facility]  . PONV (postoperative nausea and vomiting)   . Psoriasis   . Spina bifida (HCC)   . Trichimoniasis   . Vaginal discharge   . Yeast infection     Patient Active Problem List   Diagnosis Date Noted  . Knee clicking 08/12/2018  . Sickle cell trait (HCC) 01/10/2018  . Neurogenic bladder 12/27/2016  . Neurogenic bowel 12/27/2016  . Cerebral palsy (HCC) 12/12/2016  . Class 2 obesity 12/12/2016  . Encounter for surveillance of contraceptive pills 12/12/2016  . Muscle twitch 10/30/2016  . S/P cholecystectomy 03/27/2016  . Cholecystitis 02/02/2016  . Sepsis (HCC) 01/12/2016  . Absolute  anemia 08/17/2015  . Hay fever 05/17/2015  . Can't get food down 05/17/2015  . Secondary diabetes mellitus (HCC) 03/15/2015  . Other long term (current) drug therapy 04/20/2014  . Long term current use of systemic steroids 04/20/2014  . Controlled type 2 diabetes mellitus without complication (HCC) 03/17/2014  . Clinical depression 01/19/2014  . Adenopathy 01/19/2014  . CN (constipation) 08/12/2013  . Spina bifida (HCC) 08/12/2013  . Esophagitis, reflux 10/02/2011  . Avitaminosis D 10/02/2011  . Essential (primary) hypertension 05/15/2011  . Myasthenia gravis (HCC) 12/06/2010    Past Surgical History:  Procedure Laterality Date  . ADENOIDECTOMY    . BACK SURGERY    . CHOLECYSTECTOMY N/A 02/02/2016   Procedure: LAPAROSCOPIC CHOLECYSTECTOMY WITH CHOLANGIOGRAM;  Surgeon: Leafy Roiego F Pabon, MD;  Location: [facility] ORS;  Service: General;  Laterality: N/A;  . DILATION AND CURETTAGE OF UTERUS    . EXCISION NECK NODULE Left December 09, 2015   [facility]  . EYE SURGERY Right   . FOOT SURGERY Bilateral    [facility]  . HYSTEROSCOPY W/D&C  2014   benign endometrial polyps  . TONSILLECTOMY      Prior to Admission medications   Medication Sig Start Date End Date Taking? Authorizing Provider  lisinopril (PRINIVIL,ZESTRIL) 10 MG tablet take 2 pills once a day. 02/26/18   Arnaldo NatalMalinda, Paul F, MD  loratadine (CLARITIN) 10 MG tablet Take 10 mg by mouth daily.    [provider]  metFORMIN (GLUCOPHAGE)  500 MG tablet Take 500 mg by mouth 2 (two) times daily with a meal.    [provider]  mycophenolate (CELLCEPT) 500 MG tablet Take 1,500 mg by mouth 2 (two) times daily.    [provider]  nitrofurantoin (MACRODANTIN) 100 MG capsule Take by mouth. 08/05/18   [provider]  norethindrone-ethinyl estradiol (JUNEL FE 1/20) 1-20 MG-MCG tablet Take 1 tablet by mouth daily. 01/10/18   Defrancesco, Prentice DockerMartin A, MD  potassium chloride SA (K-DUR,KLOR-CON) 20 MEQ tablet  TAKE 1 TABLET BY MOUTH EVERY DAY 12/13/18   [provider]  predniSONE (DELTASONE) 5 MG tablet Take 5 mg by mouth every other day.     [provider]  pyridostigmine (MESTINON) 60 MG tablet Take 60 mg by mouth every 4 (four) hours.    [provider]  ranitidine (ZANTAC) 150 MG tablet TAKE 1 TABLET(150 MG) BY MOUTH TWICE DAILY 08/29/18   [provider]  solifenacin (VESICARE) 10 MG tablet Take 2 tablets (20 mg total) by mouth daily. 11/05/18   [provider]    Allergies Iodinated diagnostic agents, Penicillins, Latex, and Metrizamide  Family History  Adopted: Yes  Family history unknown: Yes    Social History Social History   Tobacco Use  . Smoking status: Never Smoker  . Smokeless tobacco: Never Used  Substance Use Topics  . Alcohol use: No    Alcohol/week: 0.0 standard drinks  . Drug use: No    Review of Systems Constitutional: Positive for fever and chills.  Eyes: No visual changes. ENT: No sore throat. Cardiovascular: Denies chest pain. Respiratory: Denies shortness of breath. Gastrointestinal: No abdominal pain.  No nausea, no vomiting.  No diarrhea.   Genitourinary: Positive for dysuria.  Musculoskeletal: Negative for back pain. Skin: Negative for rash. Neurological: Negative for headaches, focal weakness or numbness.  ____________________________________________   PHYSICAL EXAM:  VITAL SIGNS: ED Triage Vitals [06/08/19 1944]  Enc Vitals Group     BP (!) 151/104     Pulse Rate (!) 124     Resp 16     Temp (!) 102.7 F (39.3 C)     Temp Source Oral     SpO2 99 %     Weight 170 lb (77.1 kg)     Height 5' (1.524 m)     Head Circumference      Peak Flow      Pain Score 9   Constitutional: Alert and oriented.  Eyes: Conjunctivae are normal.  ENT      Head: Normocephalic and atraumatic.      Nose: No congestion/rhinnorhea.      Mouth/Throat: Mucous membranes are moist.      Neck: No  stridor. Hematological/Lymphatic/Immunilogical: No cervical lymphadenopathy. Cardiovascular: Tachycardic, regular rhythm.  No murmurs, rubs, or gallops.  Respiratory: Normal respiratory effort without tachypnea nor retractions. Breath sounds are clear and equal bilaterally. No wheezes/rales/rhonchi. Gastrointestinal: Soft and non tender. No rebound. No guarding.  Genitourinary: Deferred Musculoskeletal: Normal range of motion in all extremities. No lower extremity edema. Neurologic:  Normal speech and language. No gross focal neurologic deficits are appreciated.  Skin:  Skin is warm, dry and intact. No rash noted. Psychiatric: Mood and affect are normal. Speech and behavior are normal. Patient exhibits appropriate insight and judgment.  ____________________________________________    LABS (pertinent positives/negatives)  CBC wbc 12.8, hgb 13.2, plt 271 CMP wnl except glu 140, ca 8.8 Lactic 2.2 UA cloudy, positive nitrite, 11-20 rbc, >50 wbc ____________________________________________  EKG  None  ____________________________________________    RADIOLOGY  None  ____________________________________________   PROCEDURES  Procedures  ____________________________________________   INITIAL IMPRESSION / ASSESSMENT AND PLAN / ED COURSE  Pertinent labs & imaging results that were available during my care of the patient were reviewed by me and considered in my medical decision making (see chart for details).   Patient presented to the emergency department today because of concerns for possible urinary tract infection, fevers and chills.  Upon arrival patient was febrile and tachycardic.  Blood work does show a lactic acidosis with mild leukocytosis.  Urine is consistent with urinary tract infection.  Discussed findings with patient.  At this point he do think patient would benefit from hospitalization.  She does have history of myasthenia gravis.  Has had complications from  UTIs in the past.  ____________________________________________   FINAL CLINICAL IMPRESSION(S) / ED DIAGNOSES  Final diagnoses:  Lower urinary tract infectious disease     Note: This dictation was prepared with Dragon dictation. Any transcriptional errors that result from this process are unintentional     Nance Pear, MD 06/09/19 1625

## 2019-06-09 ENCOUNTER — Other Ambulatory Visit: Payer: Self-pay

## 2019-06-09 ENCOUNTER — Encounter: Payer: Self-pay | Admitting: Radiology

## 2019-06-09 DIAGNOSIS — G7000 Myasthenia gravis without (acute) exacerbation: Secondary | ICD-10-CM | POA: Diagnosis present

## 2019-06-09 DIAGNOSIS — Z7984 Long term (current) use of oral hypoglycemic drugs: Secondary | ICD-10-CM | POA: Diagnosis not present

## 2019-06-09 DIAGNOSIS — N189 Chronic kidney disease, unspecified: Secondary | ICD-10-CM | POA: Diagnosis present

## 2019-06-09 DIAGNOSIS — Z79899 Other long term (current) drug therapy: Secondary | ICD-10-CM | POA: Diagnosis not present

## 2019-06-09 DIAGNOSIS — Z7952 Long term (current) use of systemic steroids: Secondary | ICD-10-CM | POA: Diagnosis not present

## 2019-06-09 DIAGNOSIS — N390 Urinary tract infection, site not specified: Secondary | ICD-10-CM | POA: Diagnosis not present

## 2019-06-09 DIAGNOSIS — D573 Sickle-cell trait: Secondary | ICD-10-CM | POA: Diagnosis present

## 2019-06-09 DIAGNOSIS — E1122 Type 2 diabetes mellitus with diabetic chronic kidney disease: Secondary | ICD-10-CM | POA: Diagnosis present

## 2019-06-09 DIAGNOSIS — A4151 Sepsis due to Escherichia coli [E. coli]: Secondary | ICD-10-CM | POA: Diagnosis present

## 2019-06-09 DIAGNOSIS — K219 Gastro-esophageal reflux disease without esophagitis: Secondary | ICD-10-CM | POA: Diagnosis present

## 2019-06-09 DIAGNOSIS — Z1159 Encounter for screening for other viral diseases: Secondary | ICD-10-CM | POA: Diagnosis not present

## 2019-06-09 DIAGNOSIS — Q059 Spina bifida, unspecified: Secondary | ICD-10-CM | POA: Diagnosis not present

## 2019-06-09 DIAGNOSIS — I129 Hypertensive chronic kidney disease with stage 1 through stage 4 chronic kidney disease, or unspecified chronic kidney disease: Secondary | ICD-10-CM | POA: Diagnosis present

## 2019-06-09 LAB — CBC
HCT: 34.6 % — ABNORMAL LOW (ref 36.0–46.0)
Hemoglobin: 11.3 g/dL — ABNORMAL LOW (ref 12.0–15.0)
MCH: 27.4 pg (ref 26.0–34.0)
MCHC: 32.7 g/dL (ref 30.0–36.0)
MCV: 83.8 fL (ref 80.0–100.0)
Platelets: 246 10*3/uL (ref 150–400)
RBC: 4.13 MIL/uL (ref 3.87–5.11)
RDW: 12.9 % (ref 11.5–15.5)
WBC: 17.3 10*3/uL — ABNORMAL HIGH (ref 4.0–10.5)
nRBC: 0 % (ref 0.0–0.2)

## 2019-06-09 LAB — BLOOD CULTURE ID PANEL (REFLEXED)

## 2019-06-09 LAB — BASIC METABOLIC PANEL
Anion gap: 7 (ref 5–15)
BUN: 10 mg/dL (ref 6–20)
CO2: 23 mmol/L (ref 22–32)
Calcium: 7.8 mg/dL — ABNORMAL LOW (ref 8.9–10.3)
Chloride: 106 mmol/L (ref 98–111)
Creatinine, Ser: 0.64 mg/dL (ref 0.44–1.00)
GFR calc Af Amer: >60 mL/min (ref >60)
GFR calc non Af Amer: >60 mL/min (ref >60)
Glucose, Bld: 119 mg/dL — ABNORMAL HIGH (ref 70–99)
Potassium: 3.5 mmol/L (ref 3.5–5.1)
Sodium: 136 mmol/L (ref 135–145)

## 2019-06-09 LAB — PREGNANCY, URINE: Preg Test, Ur: NEGATIVE

## 2019-06-09 LAB — GLUCOSE, CAPILLARY
Glucose-Capillary: 180 mg/dL — ABNORMAL HIGH (ref 70–99)
Glucose-Capillary: 132 mg/dL — ABNORMAL HIGH (ref 70–99)
Glucose-Capillary: 133 mg/dL — ABNORMAL HIGH (ref 70–99)
Glucose-Capillary: 125 mg/dL — ABNORMAL HIGH (ref 70–99)

## 2019-06-09 LAB — SARS CORONAVIRUS 2 BY RT PCR (HOSPITAL ORDER, PERFORMED IN ~~LOC~~ HOSPITAL LAB): SARS Coronavirus 2: NEGATIVE

## 2019-06-09 MED ORDER — ONDANSETRON HCL 4 MG/2ML IJ SOLN: 4.0000 mg | Freq: Four times a day (QID) | INTRAMUSCULAR | Status: DC | PRN

## 2019-06-09 MED ORDER — ENOXAPARIN SODIUM 40 MG/0.4ML ~~LOC~~ SOLN
40.0000 mg | SUBCUTANEOUS | Status: DC
  Filled 2019-06-09 (×2): qty 0.4

## 2019-06-09 MED ORDER — ENSURE MAX PROTEIN PO LIQD
11.0000 [oz_av] | Freq: Every day | ORAL | Status: DC
  Administered 2019-06-09 – 2019-06-10 (×2): 11 [oz_av] via ORAL
  Filled 2019-06-09: qty 330

## 2019-06-09 MED ORDER — ACETAMINOPHEN 325 MG PO TABS
650.0000 mg | ORAL_TABLET | Freq: Four times a day (QID) | ORAL | Status: DC | PRN
  Administered 2019-06-09: 17:00:00 650 mg via ORAL
  Filled 2019-06-09: qty 2

## 2019-06-09 MED ORDER — SODIUM CHLORIDE 0.9 % IV SOLN
2.0000 g | INTRAVENOUS | Status: DC
  Administered 2019-06-09 – 2019-06-10 (×2): 2 g via INTRAVENOUS
  Filled 2019-06-09: qty 2
  Filled 2019-06-09: qty 20
  Filled 2019-06-09: qty 2

## 2019-06-09 MED ORDER — PYRIDOSTIGMINE BROMIDE 60 MG PO TABS
60.0000 mg | ORAL_TABLET | ORAL | Status: DC
  Administered 2019-06-09 – 2019-06-11 (×9): 60 mg via ORAL
  Filled 2019-06-09 (×17): qty 1

## 2019-06-09 MED ORDER — FAMOTIDINE 20 MG PO TABS
20.0000 mg | ORAL_TABLET | Freq: Every day | ORAL | Status: DC
  Administered 2019-06-09 – 2019-06-11 (×3): 20 mg via ORAL
  Filled 2019-06-09 (×3): qty 1

## 2019-06-09 MED ORDER — PREDNISONE 5 MG PO TABS
5.0000 mg | ORAL_TABLET | Freq: Every day | ORAL | Status: DC
  Administered 2019-06-09 – 2019-06-11 (×3): 5 mg via ORAL
  Filled 2019-06-09 (×3): qty 1

## 2019-06-09 MED ORDER — ACETAMINOPHEN 650 MG RE SUPP: 650.0000 mg | Freq: Four times a day (QID) | RECTAL | Status: DC | PRN

## 2019-06-09 MED ORDER — ADULT MULTIVITAMIN W/MINERALS CH
1.0000 | ORAL_TABLET | Freq: Every day | ORAL | Status: DC
  Administered 2019-06-09 – 2019-06-11 (×3): 1 via ORAL
  Filled 2019-06-09 (×3): qty 1

## 2019-06-09 MED ORDER — IBUPROFEN 400 MG PO TABS
600.0000 mg | ORAL_TABLET | Freq: Once | ORAL | Status: AC
  Administered 2019-06-09: 600 mg via ORAL
  Filled 2019-06-09: qty 2

## 2019-06-09 MED ORDER — MYCOPHENOLATE MOFETIL 250 MG PO CAPS
1500.0000 mg | ORAL_CAPSULE | Freq: Two times a day (BID) | ORAL | Status: DC
  Administered 2019-06-09 – 2019-06-11 (×5): 1500 mg via ORAL
  Filled 2019-06-09 (×6): qty 6

## 2019-06-09 MED ORDER — LISINOPRIL 20 MG PO TABS
20.0000 mg | ORAL_TABLET | Freq: Every day | ORAL | Status: DC
  Administered 2019-06-09 – 2019-06-11 (×3): 20 mg via ORAL
  Filled 2019-06-09 (×3): qty 1

## 2019-06-09 MED ORDER — INSULIN ASPART 100 UNIT/ML ~~LOC~~ SOLN
0.0000 [IU] | Freq: Three times a day (TID) | SUBCUTANEOUS | Status: DC
  Administered 2019-06-09: 09:00:00 2 [IU] via SUBCUTANEOUS
  Administered 2019-06-09 (×2): 1 [IU] via SUBCUTANEOUS
  Filled 2019-06-09 (×3): qty 1

## 2019-06-09 MED ORDER — SODIUM CHLORIDE 0.9 % IV SOLN
1.0000 g | Freq: Once | INTRAVENOUS | Status: AC
  Administered 2019-06-10: 1 g via INTRAVENOUS
  Filled 2019-06-09: qty 1

## 2019-06-09 MED ORDER — ONDANSETRON HCL 4 MG PO TABS: 4.0000 mg | ORAL_TABLET | Freq: Four times a day (QID) | ORAL | Status: DC | PRN

## 2019-06-09 NOTE — ED Notes (Signed)
ED TO INPATIENT HANDOFF REPORT  ED Nurse Name and Phone #: Woody SellerJenna  S Name/Age/Gender [name] 33 y.o. female Room/Bed: ED24A/ED24A  Code Status   Code Status: Prior  Home/SNF/Other Home Patient oriented to: self, place, time and situation Is this baseline? Yes   Triage Complete: Triage complete  Chief Complaint Poss uti  Triage Note Pt states she believes she has a UTI. Pt states has foul odor to urine, fever. Pt states she has chills. Pt has taken no antipyretics. Pt denies back pain. Pt states she has MG and spinal bifida.    Allergies Allergies  Allergen Reactions  . Iodinated Diagnostic Agents Itching and Hives  . Penicillins Anaphylaxis and Rash    Has patient had a PCN reaction causing immediate rash, facial/tongue/throat swelling, SOB or lightheadedness with hypotension: Yes Has patient had a PCN reaction causing severe rash involving mucus membranes or skin necrosis: No Has patient had a PCN reaction that required hospitalization No Has patient had a PCN reaction occurring within the last 10 years: Yes If all of the above answers are "NO", then may proceed with Cephalosporin use.  . Latex Rash  . Metrizamide Itching    Level of Care/Admitting Diagnosis ED Disposition    ED Disposition Condition Comment   Admit  [facility] [[id number]]  Level of Care: Med-Surg [16]  Covid Evaluation: Confirmed COVID Negative  Diagnosis: Sepsis [facility](HCC) [[id number]]) [[id number]]  Admitting Physician: Oralia ManisWILLIS, DAVID [[id number]]  Attending Physician: Oralia ManisWILLIS, DAVID [[id number]]  PT Class (Do Not Modify): Observation [104]  PT Acc Code (Do Not Modify): Observation [10022]       B Medical/Surgery History Past Medical History:  Diagnosis Date  . BV (bacterial vaginosis)   . Chronic kidney disease    Frequent UTI  . Diabetes mellitus without complication (HCC)   . DUB (dysfunctional uterine bleeding)   . E. coli UTI (urinary tract infection)   . GERD  (gastroesophageal reflux disease)   . H/O recurrent urinary tract infection   . H/O sickle cell trait   . Heavy periods   . Hypertension   . Hypokalemia   . Myasthenia gravis (HCC)   . [facility]neum[facility]  . PONV (postoperative nausea and vomiting)   . Psoriasis   . Spina bifida (HCC)   . Trichimoniasis   . Vaginal discharge   . Yeast infection    Past Surgical History:  Procedure Laterality Date  . ADENOIDECTOMY    . BACK SURGERY    . CHOLECYSTECTOMY N/A 02/02/2016   Procedure: LAPAROSCOPIC CHOLECYSTECTOMY WITH CHOLANGIOGRAM;  Surgeon: Leafy Roiego F Pabon, MD;  Location: [facility] ORS;  Service: General;  Laterality: N/A;  . DILATION AND CURETTAGE OF UTERUS    . EXCISION NECK NODULE Left December 09, 2015   [facility]  . EYE SURGERY Right   . FOOT SURGERY Bilateral    [facility]  . HYSTEROSCOPY W/D&C  2014   benign endometrial polyps  . TONSILLECTOMY       A IV Location/Drains/Wounds Patient Lines/Drains/Airways Status   Active Line/Drains/Airways    Name:   Placement date:   Placement time:   Site:   Days:   Peripheral IV 06/08/19 Right Antecubital   06/08/19    1954    Antecubital   1   Incision - 5 Ports Abdomen 1: Right;Lateral;Lower 2: Right;Lateral;Upper 3: Right;Medial 4: Upper;Mid 5: Umbilicus   02/02/16    1410     1223  Intake/Output Last 24 hours No intake or output data in the 24 hours ending 06/09/19 0119  Labs/Imaging Results for orders placed or performed during the hospital encounter of 06/08/19 (from the past 48 hour(s))  Comprehensive metabolic panel     Status: Abnormal   Collection Time: 06/08/19  7:56 PM  Result Value Ref Range   Sodium 138 135 - 145 mmol/L   Potassium 3.8 3.5 - 5.1 mmol/L   Chloride 102 98 - 111 mmol/L   CO2 23 22 - 32 mmol/L   Glucose, Bld 140 (H) 70 - 99 mg/dL   BUN 12 6 - 20 mg/dL   Creatinine, Ser 1.610.75 0.44 - 1.00 mg/dL   Calcium 8.8 (L) 8.9 - 10.3 mg/dL   Total Protein 7.2 6.5 - 8.1 g/dL    Albumin 3.9 3.5 - 5.0 g/dL   AST 19 15 - 41 U/L   ALT 21 0 - 44 U/L   Alkaline Phosphatase 62 38 - 126 U/L   Total Bilirubin 0.8 0.3 - 1.2 mg/dL   GFR calc non Af Amer >60 >60 mL/min   GFR calc Af Amer >60 >60 mL/min   Anion gap 13 5 - 15    Comment: Performed at [facility], [address] [place], [place] [address]  Lactic acid, plasma     Status: Abnormal   Collection Time: 06/08/19  7:56 PM  Result Value Ref Range   Lactic Acid, Venous 2.2 (HH) 0.5 - 1.9 mmol/L    Comment: CRITICAL RESULT CALLED TO, READ BACK BY AND VERIFIED WITH ANN CALES AT 2034 ON 06/08/2019 JJB Performed at [facility], [address] [place], [place] [address]   CBC with Differential     Status: Abnormal   Collection Time: 06/08/19  7:56 PM  Result Value Ref Range   WBC 12.8 (H) 4.0 - 10.5 K/uL   RBC 4.86 3.87 - 5.11 MIL/uL   Hemoglobin 13.2 12.0 - 15.0 g/dL   HCT 11.940.1 14.736.0 - 82.946.0 %   MCV 82.5 80.0 - 100.0 fL   MCH 27.2 26.0 - 34.0 pg   MCHC 32.9 30.0 - 36.0 g/dL   RDW 56.213.0 13.011.5 - 86.515.5 %   Platelets 271 150 - 400 K/uL   nRBC 0.0 0.0 - 0.2 %   Neutrophils Relative % 86 %   Neutro Abs 11.0 (H) 1.7 - 7.7 K/uL   Lymphocytes Relative 10 %   Lymphs Abs 1.2 0.7 - 4.0 K/uL   Monocytes Relative 2 %   Monocytes Absolute 0.3 0.1 - 1.0 K/uL   Eosinophils Relative 1 %   Eosinophils Absolute 0.1 0.0 - 0.5 K/uL   Basophils Relative 0 %   Basophils Absolute 0.1 0.0 - 0.1 K/uL   Immature Granulocytes 1 %   Abs Immature Granulocytes 0.17 (H) 0.00 - 0.07 K/uL    Comment: Performed at [facility], [address] [place], [place] [address]  Urinalysis, Complete w Microscopic     Status: Abnormal   Collection Time: 06/08/19  8:39 PM  Result Value Ref Range   Color, Urine YELLOW (A) YELLOW   APPearance CLOUDY (A) CLEAR   Specific Gravity, Urine 1.020 1.005 - 1.030   pH 5.0 5.0 - 8.0   Glucose, UA 150 (A) NEGATIVE mg/dL   Hgb urine dipstick LARGE (A) NEGATIVE   Bilirubin Urine  NEGATIVE NEGATIVE   Ketones, ur NEGATIVE NEGATIVE mg/dL   Protein, ur 30 (A) NEGATIVE mg/dL   Nitrite POSITIVE (A) NEGATIVE  Leukocytes,Ua SMALL (A) NEGATIVE   RBC / HPF 11-20 0 - 5 RBC/hpf   WBC, UA >50 (H) 0 - 5 WBC/hpf   Bacteria, UA RARE (A) NONE SEEN   Squamous Epithelial / LPF 0-5 0 - 5   Mucus PRESENT     Comment: Performed at [facility], [address] [place], [place] [address]  Pregnancy, urine     Status: None   Collection Time: 06/08/19  8:39 PM  Result Value Ref Range   Preg Test, Ur NEGATIVE NEGATIVE    Comment: Performed at [facility] [place], [place] [address]  Lactic acid, plasma     Status: None   Collection Time: 06/08/19 10:29 PM  Result Value Ref Range   Lactic Acid, Venous 1.3 0.5 - 1.9 mmol/L    Comment: Performed at [facility], [address], [place], [place] [address]  SARS Coronavirus 2 (CEPHEID - Performed in Opp hospital lab), Hosp Order     Status: None   Collection Time: 06/08/19 11:29 PM   Specimen: Nasopharyngeal Swab  Result Value Ref Range   SARS Coronavirus 2 NEGATIVE NEGATIVE    Comment: (NOTE) If result is NEGATIVE SARS-CoV-2 target nucleic acids are NOT DETECTED. The SARS-CoV-2 RNA is generally detectable in upper and lower  respiratory specimens during the acute phase of infection. The lowest  concentration of SARS-CoV-2 viral copies this assay can detect is 250  copies / mL. A negative result does not preclude SARS-CoV-2 infection  and should not be used as the sole basis for treatment or other  patient management decisions.  A negative result may occur with  improper specimen collection / handling, submission of specimen other  than nasopharyngeal swab, presence of viral mutation(s) within the  areas targeted by this assay, and inadequate number of viral copies  (<250 copies / mL). A negative result must be combined with clinical  observations, patient history, and  epidemiological information. If result is POSITIVE SARS-CoV-2 target nucleic acids are DETECTED. The SARS-CoV-2 RNA is generally detectable in upper and lower  respiratory specimens dur ing the acute phase of infection.  Positive  results are indicative of active infection with SARS-CoV-2.  Clinical  correlation with patient history and other diagnostic information is  necessary to determine patient infection status.  Positive results do  not rule out bacterial infection or co-infection with other viruses. If result is PRESUMPTIVE POSTIVE SARS-CoV-2 nucleic acids MAY BE PRESENT.   A presumptive positive result was obtained on the submitted specimen  and confirmed on repeat testing.  While 2019 novel coronavirus  (SARS-CoV-2) nucleic acids may be present in the submitted sample  additional confirmatory testing may be necessary for epidemiological  and / or clinical management purposes  to differentiate between  SARS-CoV-2 and other Sarbecovirus currently known to infect humans.  If clinically indicated additional testing with an alternate test  methodology [phone]) is advised. The SARS-CoV-2 RNA is generally  detectable in upper and lower respiratory sp ecimens during the acute  phase of infection. The expected result is Negative. Fact Sheet for Patients:  StrictlyIdeas.no Fact Sheet for Healthcare Providers: BankingDealers.co.za This test is not yet approved or cleared by the [place] FDA and has been authorized for detection and/or diagnosis of SARS-CoV-2 by FDA under an Emergency Use Authorization (EUA).  This EUA will remain in effect (meaning this test can be used) for the duration of the COVID-19 declaration under Section 564(b)(1) of the Act, 21  U.S.C. section 360bbb-3(b)(1), unless the authorization is terminated or revoked sooner. Performed at [facility], [address] [place], [place] [address]    No  results found.  Pending Labs Unresulted Labs (From admission, onward)    Start     Ordered   06/09/19 0048  Urine Culture  Add-on,   AD     06/09/19 0047   06/08/19 1950  Culture, blood (Routine x 2)  BLOOD CULTURE X 2,   STAT     06/08/19 1949   Signed and Held  HIV antibody (Routine Testing)  Once,   R     Signed and Held   Signed and Held  CBC  (enoxaparin (LOVENOX)    CrCl >/= 30 ml/min)  Once,   R    Comments: Baseline for enoxaparin therapy IF NOT ALREADY DRAWN.  Notify MD if PLT < 100 K.    Signed and Held   Signed and Held  Creatinine, serum  (enoxaparin (LOVENOX)    CrCl >/= 30 ml/min)  Once,   R    Comments: Baseline for enoxaparin therapy IF NOT ALREADY DRAWN.    Signed and Held   Signed and Held  Creatinine, serum  (enoxaparin (LOVENOX)    CrCl >/= 30 ml/min)  Weekly,   R    Comments: while on enoxaparin therapy    Signed and Held   Signed and Held  Basic metabolic panel  Tomorrow morning,   R     Signed and Held   Signed and Held  CBC  Tomorrow morning,   R     Signed and Held          Vitals/Pain Today's Vitals   06/08/19 2230 06/08/19 2330 06/09/19 0015 06/09/19 0030  BP: (!) 125/114 (!) 146/96  (!) 137/93  Pulse: (!) 122 (!) 111 (!) 104 (!) 103  Resp:  15  18  Temp:    99.5 F (37.5 C)  TempSrc:    Oral  SpO2: 97% 96% 95% 95%  Weight:      Height:      PainSc:        Isolation Precautions No active isolations  Medications Medications  sodium chloride 0.9 % bolus 1,000 mL (0 mLs Intravenous Canceled Entry 06/09/19 0016)  cefTRIAXone (ROCEPHIN) 1 g in sodium chloride 0.9 % 100 mL IVPB (0 g Intravenous Stopped 06/08/19 2257)    Mobility walks with device Low fall risk   Focused Assessments GU   R Recommendations: See Admitting Provider Note  Report given to:   Additional Notes:

## 2019-06-09 NOTE — Progress Notes (Signed)
PHARMACY - PHYSICIAN COMMUNICATION CRITICAL VALUE ALERT - BLOOD CULTURE IDENTIFICATION (BCID)  [name] is an 33 y.o. female who presented to [facility] on [facility]/08/2019 with a chief complaint of Urinary frequency and Flank Pain  Assessment:  2/4 anaerobic bottles positive for Enterobacter spp, E. Coli (KPC negative)  (include suspected source if known)  Name of physician (or Provider) Contacted: Dr. Bridgett Larsson  Current antibiotics: Rocephin  Changes to prescribed antibiotics recommended:  Patient is on recommended antibiotics - No changes needed  Results for orders placed or performed during the hospital encounter of 06/08/19  Blood Culture ID Panel (Reflexed) (Collected: 06/08/2019  7:56 PM)  Result Value Ref Range   Enterococcus species NOT DETECTED NOT DETECTED   Listeria monocytogenes NOT DETECTED NOT DETECTED   Staphylococcus species NOT DETECTED NOT DETECTED   Staphylococcus aureus (BCID) NOT DETECTED NOT DETECTED   Streptococcus species NOT DETECTED NOT DETECTED   Streptococcus agalactiae NOT DETECTED NOT DETECTED   Streptococcus pneumoniae NOT DETECTED NOT DETECTED   Streptococcus pyogenes NOT DETECTED NOT DETECTED   Acinetobacter baumannii NOT DETECTED NOT DETECTED   Enterobacteriaceae species DETECTED (A) NOT DETECTED   Enterobacter cloacae complex NOT DETECTED NOT DETECTED   Escherichia coli DETECTED (A) NOT DETECTED   Klebsiella oxytoca NOT DETECTED NOT DETECTED   Klebsiella pneumoniae NOT DETECTED NOT DETECTED   Proteus species NOT DETECTED NOT DETECTED   Serratia marcescens NOT DETECTED NOT DETECTED   Carbapenem resistance NOT DETECTED NOT DETECTED   Haemophilus influenzae NOT DETECTED NOT DETECTED   Neisseria meningitidis NOT DETECTED NOT DETECTED   Pseudomonas aeruginosa NOT DETECTED NOT DETECTED   Candida albicans NOT DETECTED NOT DETECTED   Candida glabrata NOT DETECTED NOT DETECTED   Candida krusei NOT DETECTED NOT DETECTED   Candida parapsilosis NOT DETECTED  NOT DETECTED   Candida tropicalis NOT DETECTED NOT DETECTED    Pearla Dubonnet, PharmD Clinical Pharmacist 06/09/2019 8:20 AM

## 2019-06-09 NOTE — TOC Initial Note (Signed)
Transition of Care [facility]) - Initial/Assessment Note    Patient Details  Name: [name] MRN: [id number] Date of Birth: Jun 04, 1986  [facility] of [facility]) CM/SW Contact:    Shelbie Hutching, RN Phone Number: 06/09/2019, 2:33 PM  Clinical Narrative:                 Patient admitted with urinary tract infection.  Patient self caths at home, she has a history of Myasthenia Gravis and spina bifida.  Patient reports that she lives with her sister and does everything for herself.  Patient refuses home health services, she does not need them.  Patient has no difficulty obtaining medications or urinary catheter supplies.  Patient's sister provides transportation.     Expected Discharge Plan: Home/Self Care Barriers to Discharge: Continued Medical Work up   Patient Goals and CMS Choice Patient states their goals for this hospitalization and ongoing recovery are:: To go home      Expected Discharge Plan and Services Expected Discharge Plan: Home/Self Care   Discharge Planning Services: CM Consult   Living arrangements for the past 2 months: Single Family Home                                      Prior Living Arrangements/Services Living arrangements for the past 2 months: Single Family Home Lives with:: Siblings Patient language and need for interpreter reviewed:: No Do you feel safe going back to the place where you live?: Yes      Need for Family Participation in Patient Care: Yes (Comment)(chronic medical needs) Care giver support system in place?: Yes (comment)(lives with family)   Criminal Activity/Legal Involvement Pertinent to Current Situation/Hospitalization: No - Comment as needed  Activities of Daily Living Home Assistive Devices/Equipment: Brace (specify type), Wheelchair, Cane (specify quad or straight) ADL Screening (condition at time of admission) Patient's cognitive ability adequate to safely complete daily activities?: Yes Is the patient deaf or have  difficulty hearing?: No Does the patient have difficulty seeing, even when wearing glasses/contacts?: No Does the patient have difficulty concentrating, remembering, or making decisions?: No Patient able to express need for assistance with ADLs?: Yes Does the patient have difficulty dressing or bathing?: Yes Independently performs ADLs?: Yes (appropriate for developmental age) Does the patient have difficulty walking or climbing stairs?: No Weakness of Legs: Both Weakness of Arms/Hands: None  Permission Sought/Granted                  Emotional Assessment Appearance:: Appears stated age Attitude/Demeanor/Rapport: Engaged Affect (typically observed): Accepting Orientation: : Oriented to Self, Oriented to Place, Oriented to  Time, Oriented to Situation Alcohol / Substance Use: Not Applicable Psych Involvement: No (comment)  Admission diagnosis:  Lower urinary tract infectious disease [N39.0] Patient Active Problem List   Diagnosis Date Noted  . UTI (urinary tract infection) 06/08/2019  . Knee clicking 43/15/4008  . Sickle cell trait ([place]) 01/10/2018  . Neurogenic bladder 12/27/2016  . Neurogenic bowel 12/27/2016  . Cerebral palsy ([place]) 12/12/2016  . Class 2 obesity 12/12/2016  . Encounter for surveillance of contraceptive pills 12/12/2016  . Muscle twitch 10/30/2016  . S/P cholecystectomy 03/27/2016  . Cholecystitis 02/02/2016  . Sepsis ([place]) 01/12/2016  . Absolute anemia 08/17/2015  . Hay fever 05/17/2015  . Can't get food down 05/17/2015  . Secondary diabetes mellitus ([place]) 03/15/2015  . Other long term (current) drug therapy 04/20/2014  . Long  term current use of systemic steroids 04/20/2014  . Controlled type 2 diabetes mellitus without complication (HCC) 03/17/2014  . Clinical depression 01/19/2014  . Adenopathy 01/19/2014  . CN (constipation) 08/12/2013  . Spina bifida (HCC) 08/12/2013  . Esophagitis, reflux 10/02/2011  . Avitaminosis D 10/02/2011  .  Essential (primary) hypertension 05/15/2011  . Myasthenia gravis (HCC) 12/06/2010   PCP:  Doug SouWeil, Amy B, MD Pharmacy:   [facility] [phone]#[id number] - [place] - [address] [facility] [address] [place] [address] Phone: [phone] Fax: [phone]     Social Determinants of Health (SDOH) Interventions    Readmission Risk Interventions No flowsheet data found.

## 2019-06-09 NOTE — H&P (Signed)
[facility] NAME: [name]    MR#:  [id number]  DATE OF BIRTH:  [birth year]  DATE OF ADMISSION:  06/08/2019  PRIMARY CARE PHYSICIAN: Esmond Harps, MD   REQUESTING/REFERRING PHYSICIAN: Archie Balboa, MD  CHIEF COMPLAINT:   Chief Complaint  Patient presents with  . Fever  . Dysuria    HISTORY OF PRESENT ILLNESS:  [name]  is a 33 y.o. female who presents with chief complaint as above.  Patient presents the ED with a complaint of fever and dysuria.  She is found to be febrile on presentation to the ED with a fever of >102.  Urinalysis here in the ED is highly suspicious for UTI with positive nitrites.  She does meet sepsis criteria with fever, tachycardia, leukocytosis.  Hospitalist were called for admission  PAST MEDICAL HISTORY:   Past Medical History:  Diagnosis Date  . BV (bacterial vaginosis)   . Chronic kidney disease    Frequent UTI  . Diabetes mellitus without complication ([place])   . DUB (dysfunctional uterine bleeding)   . E. coli UTI (urinary tract infection)   . GERD (gastroesophageal reflux disease)   . H/O recurrent urinary tract infection   . H/O sickle cell trait   . Heavy periods   . Hypertension   . Hypokalemia   . Myasthenia gravis ([place])   . Pneumonia January 12, 2016   [facility]  . PONV (postoperative nausea and vomiting)   . Psoriasis   . Spina bifida ([employer])   . Trichimoniasis   . Vaginal discharge   . Yeast infection      PAST SURGICAL HISTORY:   Past Surgical History:  Procedure Laterality Date  . ADENOIDECTOMY    . BACK SURGERY    . CHOLECYSTECTOMY N/A 02/02/2016   Procedure: LAPAROSCOPIC CHOLECYSTECTOMY WITH CHOLANGIOGRAM;  Surgeon: Jules Husbands, MD;  Location: [facility] ORS;  Service: General;  Laterality: N/A;  . DILATION AND CURETTAGE OF UTERUS    . EXCISION NECK NODULE Left December 09, 2015   [facility]  . EYE SURGERY Right   . FOOT SURGERY Bilateral    [facility]   . HYSTEROSCOPY W/D&C  2014   benign endometrial polyps  . TONSILLECTOMY       SOCIAL HISTORY:   Social History   Tobacco Use  . Smoking status: Never Smoker  . Smokeless tobacco: Never Used  Substance Use Topics  . Alcohol use: No    Alcohol/week: 0.0 standard drinks     FAMILY HISTORY:   Family History  Adopted: Yes  Family history unknown: Yes     DRUG ALLERGIES:   Allergies  Allergen Reactions  . Iodinated Diagnostic Agents Itching and Hives  . Penicillins Anaphylaxis and Rash    Has patient had a PCN reaction causing immediate rash, facial/tongue/throat swelling, SOB or lightheadedness with hypotension: Yes Has patient had a PCN reaction causing severe rash involving mucus membranes or skin necrosis: No Has patient had a PCN reaction that required hospitalization No Has patient had a PCN reaction occurring within the last 10 years: Yes If all of the above answers are "NO", then may proceed with Cephalosporin use.  . Latex Rash  . Metrizamide Itching    MEDICATIONS AT HOME:   Prior to Admission medications   Medication Sig Start Date End Date Taking? Authorizing Provider  lisinopril (PRINIVIL,ZESTRIL) 10 MG tablet take 2 pills once a day. Patient taking differently: Take 20  mg by mouth daily.  02/26/18  Yes Arnaldo NatalMalinda, Paul F, MD  mycophenolate (CELLCEPT) 500 MG tablet Take 1,500 mg by mouth 2 (two) times daily.   Yes [provider]  norethindrone-ethinyl estradiol (JUNEL FE 1/20) 1-20 MG-MCG tablet Take 1 tablet by mouth daily. 01/10/18  Yes Defrancesco, Prentice DockerMartin A, MD  potassium chloride SA (K-DUR,KLOR-CON) 20 MEQ tablet Take 20 mEq by mouth daily.  12/13/18  Yes [provider]  predniSONE (DELTASONE) 5 MG tablet Take 5 mg by mouth daily with breakfast.    Yes [provider]  pyridostigmine (MESTINON) 60 MG tablet Take 60 mg by mouth every 4 (four) hours.   Yes [provider]  ranitidine (ZANTAC) 150 MG tablet Take 150 mg by  mouth daily.  08/29/18  Yes [provider]  solifenacin (VESICARE) 10 MG tablet Take 20 mg by mouth daily.  11/05/18  Yes [provider]    REVIEW OF SYSTEMS:  Review of Systems  Constitutional: Positive for fever and malaise/fatigue. Negative for chills and weight loss.  HENT: Negative for ear pain, hearing loss and tinnitus.   Eyes: Negative for blurred vision, double vision, pain and redness.  Respiratory: Negative for cough, hemoptysis and shortness of breath.   Cardiovascular: Negative for chest pain, palpitations, orthopnea and leg swelling.  Gastrointestinal: Negative for abdominal pain, constipation, diarrhea, nausea and vomiting.  Genitourinary: Positive for dysuria. Negative for frequency and hematuria.  Musculoskeletal: Negative for back pain, joint pain and neck pain.  Skin:       No acne, rash, or lesions  Neurological: Negative for dizziness, tremors, focal weakness and weakness.  Endo/Heme/Allergies: Negative for polydipsia. Does not bruise/bleed easily.  Psychiatric/Behavioral: Negative for depression. The patient is not nervous/anxious and does not have insomnia.      VITAL SIGNS:   Vitals:   06/08/19 2230 06/08/19 2330 06/09/19 0015 06/09/19 0030  BP: (!) 125/114 (!) 146/96  (!) 137/93  Pulse: (!) 122 (!) 111 (!) 104 (!) 103  Resp:  15  18  Temp:    99.5 F (37.5 C)  TempSrc:    Oral  SpO2: 97% 96% 95% 95%  Weight:      Height:       Wt Readings from Last 3 Encounters:  06/08/19 77.1 kg  12/19/18 90.5 kg  12/16/18 89.4 kg    PHYSICAL EXAMINATION:  Physical Exam  Vitals reviewed. Constitutional: She is oriented to person, place, and time. She appears well-developed and well-nourished. No distress.  HENT:  Head: Normocephalic and atraumatic.  Mouth/Throat: Oropharynx is clear and moist.  Eyes: Pupils are equal, round, and reactive to light. Conjunctivae and EOM are normal. No scleral icterus.  Neck: Normal range of motion. Neck  supple. No JVD present. No thyromegaly present.  Cardiovascular: Regular rhythm and intact distal pulses. Exam reveals no gallop and no friction rub.  No murmur heard. Tachycardic  Respiratory: Effort normal and breath sounds normal. No respiratory distress. She has no wheezes. She has no rales.  GI: Soft. Bowel sounds are normal. She exhibits no distension. There is no abdominal tenderness.  Musculoskeletal: Normal range of motion.        General: No edema.     Comments: No arthritis, no gout  Lymphadenopathy:    She has no cervical adenopathy.  Neurological: She is alert and oriented to person, place, and time. No cranial nerve deficit.  No dysarthria, no aphasia  Skin: Skin is warm and dry. No rash noted. No erythema.  Psychiatric: She has a normal mood and affect. Her behavior is normal. Judgment and thought content normal.    LABORATORY PANEL:   CBC Recent Labs  Lab 06/08/19 1956  WBC 12.8*  HGB 13.2  HCT 40.1  PLT 271   ------------------------------------------------------------------------------------------------------------------  Chemistries  Recent Labs  Lab 06/08/19 1956  NA 138  K 3.8  CL 102  CO2 23  GLUCOSE 140*  BUN 12  CREATININE 0.75  CALCIUM 8.8*  AST 19  ALT 21  ALKPHOS 62  BILITOT 0.8   ------------------------------------------------------------------------------------------------------------------  Cardiac Enzymes No results for input(s): TROPONINI in the last 168 hours. ------------------------------------------------------------------------------------------------------------------  RADIOLOGY:  No results found.  EKG:   Orders placed or performed during the hospital encounter of 04/27/17  . EKG 12-Lead  . EKG 12-Lead  . EKG    IMPRESSION AND PLAN:  Principal Problem:   Sepsis (HCC) -due to UTI as below, lactic acid mildly elevated in ED, but corrected with IV fluids.  Blood pressure stable.  IV antibiotics given.  Cultures  sent from the ED Active Problems:   UTI (urinary tract infection) -IV antibiotics and urine culture as above   Essential (primary) hypertension -continue home dose antihypertensives   Myasthenia gravis (HCC) -continue home dose Mestinon   Controlled type 2 diabetes mellitus without complication (HCC) -sliding scale insulin coverage   Esophagitis, reflux -home dose PPI  Chart review performed and case discussed with ED provider. Labs, imaging and/or ECG reviewed by provider and discussed with patient/family. Management plans discussed with the patient and/or family.  COVID-19 status: Tested negative     DVT PROPHYLAXIS: SubQ lovenox   GI PROPHYLAXIS:  PPI   ADMISSION STATUS: Observation  CODE STATUS: Full Code Status History    Date Active Date Inactive Code Status Order ID Comments User Context   06/11/2016 2051 06/13/2016 1957 Full Code [id number]  [place] SirenSainani, Vivek J, MD Inpatient   02/02/2016 1107 02/03/2016 1224 Full Code [id number]  Leafy RoPabon, Diego F, MD Inpatient   01/12/2016 1733 01/15/2016 1536 Full Code [id number]  Gale JourneyWalsh, Catherine P, MD Inpatient   Advance Care Planning Activity      TOTAL TIME TAKING CARE OF THIS PATIENT: 40 minutes.   This patient was evaluated in the context of the global COVID-19 pandemic, which necessitated consideration that the patient might be at risk for infection with the SARS-CoV-2 virus that causes COVID-19. Institutional protocols and algorithms that pertain to the evaluation of patients at risk for COVID-19 are in a state of rapid change based on information released by regulatory bodies including the CDC and federal and state organizations. These policies and algorithms were followed to the best of this provider's knowledge to date during the patient's care at this facility.  Barney DrainDavid F Galena Logie 06/09/2019, 12:43 AM  [facility]  Office  [phone]  CC: Primary care physician; Doug SouWeil, Amy B, MD  Note:  This document was prepared using  Dragon voice recognition software and may include unintentional dictation errors.

## 2019-06-09 NOTE — Progress Notes (Signed)
Initial Nutrition Assessment  RD working remotely.  DOCUMENTATION CODES:   Obesity unspecified  INTERVENTION:  Provide Ensure Max Protein po once daily, each supplement provides 150 kcal and 30 grams of protein. Patient prefers vanilla.  Provide daily MVI.  NUTRITION DIAGNOSIS:   Inadequate oral intake related to decreased appetite as evidenced by per patient/family report.  GOAL:   Patient will meet greater than or equal to 90% of their needs  MONITOR:   PO intake, Supplement acceptance, Labs, Weight trends, I & O's  REASON FOR ASSESSMENT:   Malnutrition Screening Tool    ASSESSMENT:   33 year old female with PMHx of DM, psoriasis, hx recurrent UTI, spina bifida, GERD, HTN, myasthenia gravis, CKD admitted with sepsis due to UTI.   Spoke with patient over the phone. She reports her appetite has been decreased for a few days now since she has not been feeling well. She reports she has been eating about 50% of her meals the past few days. This morning she did eat 100% of her breakfast according to chart. She is amenable to drinking ONS to help meet protein needs.  Patient reports she has been losing weight but is unsure of weight history. Per chart she has been 88-91 kg the past year. She is currently 91.8 kg (202.38 lbs).  Medications reviewed and include: famotidine, Novolog 0-9 units TID, lisinopril 20 mg daily, Cellcept, prednisone 5 mg daily, ceftriaxone.  Labs reviewed: CBG 180. Last HgbA1c was 8.6 on 01/12/2016.  NUTRITION - FOCUSED PHYSICAL EXAM:  Unable to complete at this time.  Diet Order:   Diet Order            Diet heart healthy/carb modified Room service appropriate? Yes; Fluid consistency: Thin  Diet effective now             EDUCATION NEEDS:   No education needs have been identified at this time  Skin:  Skin Assessment: Reviewed RN Assessment  Last BM:  06/09/2019 per chart  Height:   Ht Readings from Last 1 Encounters:  06/09/19 5' (1.524  m)   Weight:   Wt Readings from Last 1 Encounters:  06/09/19 91.8 kg   Ideal Body Weight:  45.5 kg  BMI:  Body mass index is 39.53 kg/m.  Estimated Nutritional Needs:   Kcal:  1800-2000  Protein:  90-100 grams  Fluid:  1.8-2 L/day  Willey Blade, MS, RD, LDN Office: [phone] Pager: [phone] After Hours/Weekend Pager: [phone]

## 2019-06-09 NOTE — Progress Notes (Signed)
Patient admitted to unit for UTI, pt A&Ox4. Pt was reeducated on Peri care and sterile technique prior to self craterization. Pt stated and demonstrated understanding. Pt educated on UTI prevention. Pt stated understanding. Will continue to monitor.

## 2019-06-09 NOTE — Progress Notes (Signed)
The patient feels better. Vital signs and labs reviewed, blood culture reported E. Coli. Physical examination is done. Continue Rocephin IV, follow-up urine culture.  Continue current treatment. I discussed with the patient and RN, time spent about 25 minutes.

## 2019-06-10 LAB — BASIC METABOLIC PANEL
Anion gap: 11 (ref 5–15)
BUN: 12 mg/dL (ref 6–20)
CO2: 21 mmol/L — ABNORMAL LOW (ref 22–32)
Calcium: 8.4 mg/dL — ABNORMAL LOW (ref 8.9–10.3)
Chloride: 100 mmol/L (ref 98–111)
Creatinine, Ser: 0.53 mg/dL (ref 0.44–1.00)
GFR calc Af Amer: >60 mL/min (ref >60)
GFR calc non Af Amer: >60 mL/min (ref >60)
Glucose, Bld: 175 mg/dL — ABNORMAL HIGH (ref 70–99)
Potassium: 4.3 mmol/L (ref 3.5–5.1)
Sodium: 132 mmol/L — ABNORMAL LOW (ref 135–145)

## 2019-06-10 LAB — CBC
HCT: 39 % (ref 36.0–46.0)
Hemoglobin: 12.6 g/dL (ref 12.0–15.0)
MCH: 27.6 pg (ref 26.0–34.0)
MCHC: 32.3 g/dL (ref 30.0–36.0)
MCV: 85.3 fL (ref 80.0–100.0)
Platelets: 228 10*3/uL (ref 150–400)
RBC: 4.57 MIL/uL (ref 3.87–5.11)
RDW: 12.9 % (ref 11.5–15.5)
WBC: 12.5 10*3/uL — ABNORMAL HIGH (ref 4.0–10.5)
nRBC: 0 % (ref 0.0–0.2)

## 2019-06-10 LAB — GLUCOSE, CAPILLARY
Glucose-Capillary: 106 mg/dL — ABNORMAL HIGH (ref 70–99)
Glucose-Capillary: 102 mg/dL — ABNORMAL HIGH (ref 70–99)
Glucose-Capillary: 105 mg/dL — ABNORMAL HIGH (ref 70–99)
Glucose-Capillary: 158 mg/dL — ABNORMAL HIGH (ref 70–99)

## 2019-06-10 LAB — HIV ANTIBODY (ROUTINE TESTING W REFLEX): HIV Screen 4th Generation wRfx: NONREACTIVE

## 2019-06-10 MED ORDER — IBUPROFEN 400 MG PO TABS
400.0000 mg | ORAL_TABLET | Freq: Four times a day (QID) | ORAL | Status: DC | PRN
  Administered 2019-06-10: 400 mg via ORAL
  Filled 2019-06-10: qty 1
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
## 2019-06-10 NOTE — Progress Notes (Signed)
[facility] [place] [facility]   PATIENT NAME: [name]    MR#:  [id number]  DATE OF BIRTH:  01/10/1986  SUBJECTIVE:  CHIEF COMPLAINT:   Chief Complaint  Patient presents with  . Fever  . Dysuria   The patient has fever, chills and nausea REVIEW OF SYSTEMS:  Review of Systems  Constitutional: Positive for chills, fever and malaise/fatigue.  HENT: Negative for sore throat.   Eyes: Negative for blurred vision and double vision.  Respiratory: Negative for cough, hemoptysis, shortness of breath, wheezing and stridor.   Cardiovascular: Negative for chest pain, palpitations, orthopnea and leg swelling.  Gastrointestinal: Positive for nausea. Negative for abdominal pain, blood in stool, diarrhea, melena and vomiting.  Genitourinary: Negative for dysuria, flank pain and hematuria.  Musculoskeletal: Negative for back pain and joint pain.  Skin: Negative for rash.  Neurological: Negative for dizziness, sensory change, focal weakness, seizures, loss of consciousness, weakness and headaches.  Endo/Heme/Allergies: Negative for polydipsia.  Psychiatric/Behavioral: Negative for depression. The patient is not nervous/anxious.     DRUG ALLERGIES:   Allergies  Allergen Reactions  . Iodinated Diagnostic Agents Itching and Hives  . Penicillins Anaphylaxis and Rash    Has patient had a PCN reaction causing immediate rash, facial/tongue/throat swelling, SOB or lightheadedness with hypotension: Yes Has patient had a PCN reaction causing severe rash involving mucus membranes or skin necrosis: No Has patient had a PCN reaction that required hospitalization No Has patient had a PCN reaction occurring within the last 10 years: Yes If all of the above answers are "NO", then may proceed with Cephalosporin use.  . Latex Rash  . Metrizamide Itching   VITALS:  Blood pressure (!) 139/102, pulse (!) 112, temperature 100.1 F (37.8 C), resp. rate 17, height 5' (1.524 m),  weight 91.8 kg, last menstrual period 05/01/2019, SpO2 100 %. PHYSICAL EXAMINATION:  Physical Exam Constitutional:      General: She is not in acute distress.    Appearance: She is obese.  HENT:     Head: Normocephalic.     Mouth/Throat:     Mouth: Mucous membranes are moist.  Eyes:     General: No scleral icterus.    Conjunctiva/sclera: Conjunctivae normal.     Pupils: Pupils are equal, round, and reactive to light.  Neck:     Musculoskeletal: Normal range of motion and neck supple.     Vascular: No JVD.     Trachea: No tracheal deviation.  Cardiovascular:     Rate and Rhythm: Normal rate and regular rhythm.     Heart sounds: Normal heart sounds. No murmur. No gallop.   Pulmonary:     Effort: Pulmonary effort is normal. No respiratory distress.     Breath sounds: Normal breath sounds. No wheezing or rales.  Abdominal:     General: Bowel sounds are normal. There is no distension.     Palpations: Abdomen is soft.     Tenderness: There is no abdominal tenderness. There is no rebound.  Musculoskeletal: Normal range of motion.        General: No tenderness.     Right lower leg: No edema.     Left lower leg: No edema.  Skin:    Findings: No erythema or rash.  Neurological:     General: No focal deficit present.     Mental Status: She is alert and oriented to person, place, and time.     Cranial Nerves: No cranial nerve deficit.  Psychiatric:        Mood and Affect: Mood normal.    LABORATORY PANEL:  Female CBC Recent Labs  Lab 06/10/19 1008  WBC 12.5*  HGB 12.6  HCT 39.0  PLT 228   ------------------------------------------------------------------------------------------------------------------ Chemistries  Recent Labs  Lab 06/08/19 1956  06/10/19 1008  NA 138   < > 132*  K 3.8   < > 4.3  CL 102   < > 100  CO2 23   < > 21*  GLUCOSE 140*   < > 175*  BUN 12   < > 12  CREATININE 0.75   < > 0.53  CALCIUM 8.8*   < > 8.4*  AST 19  --   --   ALT 21  --   --    ALKPHOS 62  --   --   BILITOT 0.8  --   --    < > = values in this interval not displayed.   RADIOLOGY:  No results found. ASSESSMENT AND PLAN:   Sepsis due to UTI and E. coli bacteremia.   Continue Rocephin IV, follow-up urine culture.  Leukocytosis is improving. Lactic acidosis, improved.   Essential (primary) hypertension -continue home dose antihypertensives   Myasthenia gravis ([place]) -continue home dose Mestinon   Controlled type 2 diabetes mellitus without complication (HCC) -sliding scale insulin coverage   Esophagitis, reflux -home dose PPI  All the records are reviewed and case discussed with Care Management/Social Worker. Management plans discussed with the patient, family and they are in agreement.  CODE STATUS: Full Code  TOTAL TIME TAKING CARE OF THIS PATIENT: 26 minutes.   More than 50% of the time was spent in counseling/coordination of care: YES  POSSIBLE D/C IN 1-2 DAYS, DEPENDING ON CLINICAL CONDITION.   Demetrios Loll M.D on 06/10/2019 at 11:22 AM  Between 7am to 6pm - Pager - [phone]  After 6pm go to [email or web address] - [facility]

## 2019-06-11 LAB — CULTURE, BLOOD (ROUTINE X 2)
Special Requests: ADEQUATE
Special Requests: ADEQUATE

## 2019-06-11 LAB — BASIC METABOLIC PANEL
Anion gap: 11 (ref 5–15)
BUN: 13 mg/dL (ref 6–20)
CO2: 22 mmol/L (ref 22–32)
Calcium: 8.2 mg/dL — ABNORMAL LOW (ref 8.9–10.3)
Chloride: 101 mmol/L (ref 98–111)
Creatinine, Ser: 0.64 mg/dL (ref 0.44–1.00)
GFR calc Af Amer: >60 mL/min (ref >60)
GFR calc non Af Amer: >60 mL/min (ref >60)
Glucose, Bld: 102 mg/dL — ABNORMAL HIGH (ref 70–99)
Potassium: 4.1 mmol/L (ref 3.5–5.1)
Sodium: 134 mmol/L — ABNORMAL LOW (ref 135–145)

## 2019-06-11 LAB — CBC
HCT: 35.9 % — ABNORMAL LOW (ref 36.0–46.0)
Hemoglobin: 12.2 g/dL (ref 12.0–15.0)
MCH: 27.1 pg (ref 26.0–34.0)
MCHC: 34 g/dL (ref 30.0–36.0)
MCV: 79.8 fL — ABNORMAL LOW (ref 80.0–100.0)
Platelets: 262 10*3/uL (ref 150–400)
RBC: 4.5 MIL/uL (ref 3.87–5.11)
RDW: 12.5 % (ref 11.5–15.5)
WBC: 10.8 10*3/uL — ABNORMAL HIGH (ref 4.0–10.5)
nRBC: 0 % (ref 0.0–0.2)

## 2019-06-11 LAB — URINE CULTURE: Culture: >=100000 — AB

## 2019-06-11 LAB — GLUCOSE, CAPILLARY: Glucose-Capillary: 101 mg/dL — ABNORMAL HIGH (ref 70–99)

## 2019-06-11 MED ORDER — LISINOPRIL 10 MG PO TABS: 20.0000 mg | ORAL_TABLET | Freq: Every day | ORAL | 0 refills | Status: AC

## 2019-06-11 MED ORDER — CEPHALEXIN 500 MG PO CAPS: 500.0000 mg | ORAL_CAPSULE | Freq: Four times a day (QID) | ORAL | 0 refills | Status: AC

## 2019-06-11 NOTE — Discharge Instructions (Signed)
Resume diet and activity as before ° ° °

## 2019-06-11 NOTE — Progress Notes (Signed)
Pt is being discharged home. AVS discharge papers given and explained to pt.  Pt verbalized understanding. Pt informed to schedule her f/u appointment. Pt verbalized understanding.

## 2019-06-11 NOTE — Plan of Care (Signed)

## 2019-06-12 ENCOUNTER — Telehealth: Payer: Self-pay | Admitting: Obstetrics and Gynecology

## 2019-06-12 NOTE — Telephone Encounter (Signed)
Pt is requesting an appt. Recently moved back to the area. NO AE since 12/2017. Wants ocp to control cycle.   Appt made with with JML.

## 2019-06-12 NOTE — Telephone Encounter (Signed)
The pt called and stated that she needs to speak with CM, Pt became frustrated after trying to get further information from her to leave a message. Please advise.

## 2019-06-23 NOTE — Discharge Summary (Signed)
[facility] [place] [facility]   PATIENT NAME: [name]    MR#:  [id number]  DATE OF BIRTH:  05-18-1986  DATE OF ADMISSION:  06/08/2019 ADMITTING PHYSICIAN: Oralia Manis, MD  DATE OF DISCHARGE: 06/11/2019  1:00 PM  PRIMARY CARE PHYSICIAN: Doug Sou, MD   ADMISSION DIAGNOSIS:  Lower urinary tract infectious disease [N39.0]  DISCHARGE DIAGNOSIS:  Principal Problem:   Sepsis (HCC) Active Problems:   Essential (primary) hypertension   Myasthenia gravis (HCC)   Esophagitis, reflux   Controlled type 2 diabetes mellitus without complication (HCC)   UTI (urinary tract infection)   SECONDARY DIAGNOSIS:   Past Medical History:  Diagnosis Date  . BV (bacterial vaginosis)   . Chronic kidney disease    Frequent UTI  . Diabetes mellitus without complication (HCC)   . DUB (dysfunctional uterine bleeding)   . E. coli UTI (urinary tract infection)   . GERD (gastroesophageal reflux disease)   . H/O recurrent urinary tract infection   . H/O sickle cell trait   . Heavy periods   . Hypertension   . Hypokalemia   . Myasthenia gravis (HCC)   . Pneumonia January 12, 2016   [facility]  . PONV (postoperative nausea and vomiting)   . Psoriasis   . Spina bifida (HCC)   . Trichimoniasis   . Vaginal discharge   . Yeast infection      ADMITTING HISTORY  [name]  is a 33 y.o. female who presents with chief complaint as above.  Patient presents the ED with a complaint of fever and dysuria.  She is found to be febrile on presentation to the ED with a fever of >102.  Urinalysis here in the ED is highly suspicious for UTI with positive nitrites.  She does meet sepsis criteria with fever, tachycardia, leukocytosis.  Hospitalist were called for admission  HOSPITAL COURSE:   * E. coli bacteremia secondary to UTI.  Sepsis present on admission has resolved Patient by the day of discharge has no abdominal pain.  Afebrile.  Normal WBC. Discharge home on oral  antibiotics Keflex for 9 more days.   * Continue Rocephin IV, follow-up urine culture.  Leukocytosis is improving.  Essential (primary) hypertension -continue home dose antihypertensives  Myasthenia gravis (HCC) -continue home dose Mestinon  Controlled type 2 diabetes mellitus without complication (HCC) -sliding scale insulin coverage.  Esophagitis, reflux -home dose PPI  CONSULTS OBTAINED:    DRUG ALLERGIES:   Allergies  Allergen Reactions  . Iodinated Diagnostic Agents Itching and Hives  . Penicillins Anaphylaxis and Rash    Has patient had a PCN reaction causing immediate rash, facial/tongue/throat swelling, SOB or lightheadedness with hypotension: Yes Has patient had a PCN reaction causing severe rash involving mucus membranes or skin necrosis: No Has patient had a PCN reaction that required hospitalization No Has patient had a PCN reaction occurring within the last 10 years: Yes If all of the above answers are "NO", then may proceed with Cephalosporin use.  . Latex Rash  . Metrizamide Itching    DISCHARGE MEDICATIONS:   Allergies as of 06/11/2019      Reactions   Iodinated Diagnostic Agents Itching, Hives   Penicillins Anaphylaxis, Rash   Has patient had a PCN reaction causing immediate rash, facial/tongue/throat swelling, SOB or lightheadedness with hypotension: Yes Has patient had a PCN reaction causing severe rash involving mucus membranes or skin necrosis: No Has patient had a PCN reaction that required hospitalization No Has patient had  a PCN reaction occurring within the last 10 years: Yes If all of the above answers are "NO", then may proceed with Cephalosporin use.   Latex Rash   Metrizamide Itching      Medication List    TAKE these medications   lisinopril 10 MG tablet Commonly known as: ZESTRIL Take 2 tablets (20 mg total) by mouth daily.   mycophenolate 500 MG tablet Commonly known as: CELLCEPT Take 1,500 mg by mouth 2 (two) times  daily.   norethindrone-ethinyl estradiol 1-20 MG-MCG tablet Commonly known as: Junel FE 1/20 Take 1 tablet by mouth daily.   potassium chloride SA 20 MEQ tablet Commonly known as: K-DUR Take 20 mEq by mouth daily.   predniSONE 5 MG tablet Commonly known as: DELTASONE Take 5 mg by mouth daily with breakfast.   pyridostigmine 60 MG tablet Commonly known as: MESTINON Take 60 mg by mouth every 4 (four) hours.   ranitidine 150 MG tablet Commonly known as: ZANTAC Take 150 mg by mouth daily.   solifenacin 10 MG tablet Commonly known as: VESICARE Take 20 mg by mouth daily.     ASK your doctor about these medications   cephALEXin 500 MG capsule Commonly known as: KEFLEX Take 1 capsule (500 mg total) by mouth 4 (four) times daily for 9 days. Ask about: Should I take this medication?       Today   VITAL SIGNS:  Blood pressure 133/89, pulse 91, temperature 98.4 F (36.9 C), temperature source Oral, resp. rate 18, height 5' (1.524 m), weight 91.8 kg, last menstrual period 05/01/2019, SpO2 100 %.  I/O:  No intake or output data in the 24 hours ending 06/23/19 1431  PHYSICAL EXAMINATION:  Physical Exam  GENERAL:  33 y.o.-year-old patient lying in the bed with no acute distress.  LUNGS: Normal breath sounds bilaterally, no wheezing, rales,rhonchi or crepitation. No use of accessory muscles of respiration.  CARDIOVASCULAR: S1, S2 normal. No murmurs, rubs, or gallops.  ABDOMEN: Soft, non-tender, non-distended. Bowel sounds present. No organomegaly or mass.  NEUROLOGIC: Moves all 4 extremities. PSYCHIATRIC: The patient is alert and oriented x 3.  SKIN: No obvious rash, lesion, or ulcer.   DATA REVIEW:   CBC No results for input(s): WBC, HGB, HCT, PLT in the last 168 hours.  Chemistries  No results for input(s): NA, K, CL, CO2, GLUCOSE, BUN, CREATININE, CALCIUM, MG, AST, ALT, ALKPHOS, BILITOT in the last 168 hours.  Invalid input(s): GFRCGP  Cardiac Enzymes No results  for input(s): TROPONINI in the last 168 hours.  Microbiology Results  Results for orders placed or performed during the hospital encounter of 06/08/19  Culture, blood (Routine x 2)     Status: Abnormal   Collection Time: 06/08/19  7:56 PM   Specimen: BLOOD  Result Value Ref Range Status   Specimen Description   Final    BLOOD RIGHT ANTECUBITAL Performed at [facility], [address], [place], [place] [address]    Special Requests   Final    BOTTLES DRAWN AEROBIC AND ANAEROBIC Blood Culture adequate volume Performed at [facility], [address] [place], [place] [address]    Culture  Setup Time   Final    GRAM NEGATIVE RODS IN BOTH AEROBIC AND ANAEROBIC BOTTLES CRITICAL RESULT CALLED TO, READ BACK BY AND VERIFIED WITH: ASAJAH DUNCAN AT 0809 ON 06/09/2019 [place]. Performed at [facility], [address], [place], [place] [address]    Culture ESCHERICHIA COLI (A)  Final  Report Status 06/11/2019 FINAL  Final   Organism ID, Bacteria ESCHERICHIA COLI  Final      Susceptibility   Escherichia coli - MIC*    AMPICILLIN >=32 RESISTANT Resistant     CEFAZOLIN <=4 SENSITIVE Sensitive     CEFEPIME <=1 SENSITIVE Sensitive     CEFTAZIDIME <=1 SENSITIVE Sensitive     CEFTRIAXONE <=1 SENSITIVE Sensitive     CIPROFLOXACIN <=0.25 SENSITIVE Sensitive     GENTAMICIN <=1 SENSITIVE Sensitive     IMIPENEM <=0.25 SENSITIVE Sensitive     TRIMETH/SULFA >=320 RESISTANT Resistant     AMPICILLIN/SULBACTAM >=32 RESISTANT Resistant     PIP/TAZO <=4 SENSITIVE Sensitive     Extended ESBL NEGATIVE Sensitive     * ESCHERICHIA COLI  Blood Culture ID Panel (Reflexed)     Status: Abnormal   Collection Time: 06/08/19  7:56 PM  Result Value Ref Range Status   Enterococcus species NOT DETECTED NOT DETECTED Final   Listeria monocytogenes NOT DETECTED NOT DETECTED Final   Staphylococcus species NOT DETECTED NOT DETECTED Final   Staphylococcus aureus (BCID) NOT DETECTED NOT  DETECTED Final   Streptococcus species NOT DETECTED NOT DETECTED Final   Streptococcus agalactiae NOT DETECTED NOT DETECTED Final   Streptococcus pneumoniae NOT DETECTED NOT DETECTED Final   Streptococcus pyogenes NOT DETECTED NOT DETECTED Final   Acinetobacter baumannii NOT DETECTED NOT DETECTED Final   Enterobacteriaceae species DETECTED (A) NOT DETECTED Final    Comment: Enterobacteriaceae represent a large family of gram-negative bacteria, not a single organism. CRITICAL RESULT CALLED TO, READ BACK BY AND VERIFIED WITH: ASAJAH DUNCAN AT 0809 ON 06/09/2019 [facility].    Enterobacter cloacae complex NOT DETECTED NOT DETECTED Final   Escherichia coli DETECTED (A) NOT DETECTED Final    Comment: CRITICAL RESULT CALLED TO, READ BACK BY AND VERIFIED WITH: ASAJAH DUNCAN AT 0809 ON 06/09/2019 [facility].    Klebsiella oxytoca NOT DETECTED NOT DETECTED Final   Klebsiella pneumoniae NOT DETECTED NOT DETECTED Final   Proteus species NOT DETECTED NOT DETECTED Final   Serratia marcescens NOT DETECTED NOT DETECTED Final   Carbapenem resistance NOT DETECTED NOT DETECTED Final   Haemophilus influenzae NOT DETECTED NOT DETECTED Final   Neisseria meningitidis NOT DETECTED NOT DETECTED Final   Pseudomonas aeruginosa NOT DETECTED NOT DETECTED Final   Candida albicans NOT DETECTED NOT DETECTED Final   Candida glabrata NOT DETECTED NOT DETECTED Final   Candida krusei NOT DETECTED NOT DETECTED Final   Candida parapsilosis NOT DETECTED NOT DETECTED Final   Candida tropicalis NOT DETECTED NOT DETECTED Final    Comment: Performed at [facility], [address] [place], [place] [address]  Urine Culture     Status: Abnormal   Collection Time: 06/08/19  8:39 PM   Specimen: Urine, Random  Result Value Ref Range Status   Specimen Description   Final    URINE, RANDOM Performed at [facility], [address] [place], [place] [address]    Special Requests   Final    NONE Performed at [facility], [address] [place], [place] [address]    Culture >=100,000 COLONIES/mL ESCHERICHIA COLI (A)  Final   Report Status 06/11/2019 FINAL  Final   Organism ID, Bacteria ESCHERICHIA COLI (A)  Final      Susceptibility   Escherichia coli - MIC*    AMPICILLIN >=32 RESISTANT Resistant     CEFAZOLIN <=4 SENSITIVE Sensitive     CEFTRIAXONE <=1 SENSITIVE Sensitive  CIPROFLOXACIN <=0.25 SENSITIVE Sensitive     GENTAMICIN <=1 SENSITIVE Sensitive     IMIPENEM <=0.25 SENSITIVE Sensitive     NITROFURANTOIN <=16 SENSITIVE Sensitive     TRIMETH/SULFA >=320 RESISTANT Resistant     AMPICILLIN/SULBACTAM >=32 RESISTANT Resistant     PIP/TAZO <=4 SENSITIVE Sensitive     Extended ESBL NEGATIVE Sensitive     * >=100,000 COLONIES/mL ESCHERICHIA COLI  Culture, blood (Routine x 2)     Status: Abnormal   Collection Time: 06/08/19  9:10 PM   Specimen: BLOOD  Result Value Ref Range Status   Specimen Description   Final    BLOOD RIGHT FOREARM Performed at [facility], [address] [place], [place] [address]    Special Requests   Final    BOTTLES DRAWN AEROBIC AND ANAEROBIC Blood Culture adequate volume Performed at [facility], [address] [place], [place] [address]    Culture  Setup Time   Final    GRAM NEGATIVE RODS ANAEROBIC BOTTLE ONLY CRITICAL VALUE NOTED.  VALUE IS CONSISTENT WITH PREVIOUSLY REPORTED AND CALLED VALUE. Performed at [facility], [address] [place], [place] [address]    Culture (A)  Final    ESCHERICHIA COLI SUSCEPTIBILITIES PERFORMED ON PREVIOUS CULTURE WITHIN THE LAST 5 DAYS. Performed at [facility], [address] [place], [place] [address]    Report Status 06/11/2019 FINAL  Final  SARS Coronavirus 2 (CEPHEID - Performed in [facility] hospital lab), Hosp Order     Status: None   Collection Time: 06/08/19 11:29 PM   Specimen: Nasopharyngeal Swab  Result Value Ref Range Status   SARS Coronavirus 2 NEGATIVE  NEGATIVE Final    Comment: (NOTE) If result is NEGATIVE SARS-CoV-2 target nucleic acids are NOT DETECTED. The SARS-CoV-2 RNA is generally detectable in upper and lower  respiratory specimens during the acute phase of infection. The lowest  concentration of SARS-CoV-2 viral copies this assay can detect is 250  copies / mL. A negative result does not preclude SARS-CoV-2 infection  and should not be used as the sole basis for treatment or other  patient management decisions.  A negative result may occur with  improper specimen collection / handling, submission of specimen other  than nasopharyngeal swab, presence of viral mutation(s) within the  areas targeted by this assay, and inadequate number of viral copies  (<250 copies / mL). A negative result must be combined with clinical  observations, patient history, and epidemiological information. If result is POSITIVE SARS-CoV-2 target nucleic acids are DETECTED. The SARS-CoV-2 RNA is generally detectable in upper and lower  respiratory specimens dur ing the acute phase of infection.  Positive  results are indicative of active infection with SARS-CoV-2.  Clinical  correlation with patient history and other diagnostic information is  necessary to determine patient infection status.  Positive results do  not rule out bacterial infection or co-infection with other viruses. If result is PRESUMPTIVE POSTIVE SARS-CoV-2 nucleic acids MAY BE PRESENT.   A presumptive positive result was obtained on the submitted specimen  and confirmed on repeat testing.  While 2019 novel coronavirus  (SARS-CoV-2) nucleic acids may be present in the submitted sample  additional confirmatory testing may be necessary for epidemiological  and / or clinical management purposes  to differentiate between  SARS-CoV-2 and other Sarbecovirus currently known to infect humans.  If clinically indicated additional testing with an alternate test  methodology [phone] is  advised. The SARS-CoV-2 RNA is generally  detectable in upper and lower respiratory sp ecimens during the acute  phase of infection. The expected result is Negative. Fact Sheet for Patients:  BoilerBrush.com.cyhttps://www.fda.gov/media/136312/download Fact Sheet for Healthcare Providers: https://pope.com/https://www.fda.gov/media/136313/download This test is not yet approved or cleared by the [place] FDA and has been authorized for detection and/or diagnosis of SARS-CoV-2 by FDA under an Emergency Use Authorization (EUA).  This EUA will remain in effect (meaning this test can be used) for the duration of the COVID-19 declaration under Section 564(b)(1) of the Act, 21 U.S.C. section 360bbb-3(b)(1), unless the authorization is terminated or revoked sooner. Performed at [facility], [address] [place], [place] [address]     RADIOLOGY:  No results found.  Follow up with PCP in 1 week.  Management plans discussed with the patient, family and they are in agreement.  CODE STATUS:  Code Status History    Date Active Date Inactive Code Status Order ID Comments User Context   06/09/2019 0212 06/11/2019 1907 Full Code [id number]  Oralia ManisWillis, David, MD Inpatient   06/11/2016 2051 06/13/2016 1957 Full Code [id number]  Houston SirenSainani, Vivek J, MD Inpatient   02/02/2016 1107 02/03/2016 1224 Full Code [id number]  Leafy RoPabon, Diego F, MD Inpatient   01/12/2016 1733 01/15/2016 1536 Full Code [id number]  Gale JourneyWalsh, Catherine P, MD Inpatient   Advance Care Planning Activity    Advance Directive Documentation     Most Recent Value  Type of Advance Directive  Healthcare Power of Attorney  Pre-existing out of facility DNR order (yellow form or pink MOST form)  -  "MOST" Form in Place?  -      TOTAL TIME TAKING CARE OF THIS PATIENT ON DAY OF DISCHARGE: more than 30 minutes.   Molinda BailiffSrikar R Florine Sprenkle M.D on 06/23/2019 at 2:31 PM  Between 7am to 6pm - Pager - [phone]  After 6pm go to [email or web address] - password [email or web address]  [facility]  Office  [phone]  CC: Primary care physician; Doug SouWeil, Amy B, MD  Note: This dictation was prepared with Dragon dictation along with smaller phrase technology. Any transcriptional errors that result from this process are unintentional.

## 2019-07-01 ENCOUNTER — Encounter: Payer: Medicaid Other | Admitting: Certified Nurse Midwife

## 2019-07-22 ENCOUNTER — Encounter: Payer: Medicaid Other | Admitting: Certified Nurse Midwife

## 2020-01-18 ENCOUNTER — Emergency Department
Admission: EM | Admit: 2020-01-18 | Discharge: 2020-01-18 | Disposition: A | Discharge status: Home / Self Care | Payer: Medicaid Other | Attending: Emergency Medicine | Admitting: Emergency Medicine

## 2020-01-18 ENCOUNTER — Emergency Department: Payer: Medicaid Other

## 2020-01-18 ENCOUNTER — Other Ambulatory Visit: Payer: Self-pay

## 2020-01-18 ENCOUNTER — Encounter: Payer: Self-pay | Admitting: Emergency Medicine

## 2020-01-18 DIAGNOSIS — Z79899 Other long term (current) drug therapy: Secondary | ICD-10-CM | POA: Diagnosis not present

## 2020-01-18 DIAGNOSIS — Z91040 Latex allergy status: Secondary | ICD-10-CM | POA: Diagnosis not present

## 2020-01-18 DIAGNOSIS — Y929 Unspecified place or not applicable: Secondary | ICD-10-CM | POA: Diagnosis not present

## 2020-01-18 DIAGNOSIS — R200 Anesthesia of skin: Secondary | ICD-10-CM | POA: Insufficient documentation

## 2020-01-18 DIAGNOSIS — Y998 Other external cause status: Secondary | ICD-10-CM | POA: Diagnosis not present

## 2020-01-18 DIAGNOSIS — X58XXXA Exposure to other specified factors, initial encounter: Secondary | ICD-10-CM | POA: Insufficient documentation

## 2020-01-18 DIAGNOSIS — Y9389 Activity, other specified: Secondary | ICD-10-CM | POA: Diagnosis not present

## 2020-01-18 DIAGNOSIS — N189 Chronic kidney disease, unspecified: Secondary | ICD-10-CM | POA: Diagnosis not present

## 2020-01-18 DIAGNOSIS — I129 Hypertensive chronic kidney disease with stage 1 through stage 4 chronic kidney disease, or unspecified chronic kidney disease: Secondary | ICD-10-CM | POA: Insufficient documentation

## 2020-01-18 DIAGNOSIS — K5909 Other constipation: Secondary | ICD-10-CM | POA: Diagnosis not present

## 2020-01-18 DIAGNOSIS — S99922A Unspecified injury of left foot, initial encounter: Secondary | ICD-10-CM | POA: Diagnosis present

## 2020-01-18 DIAGNOSIS — S91209A Unspecified open wound of unspecified toe(s) with damage to nail, initial encounter: Secondary | ICD-10-CM | POA: Insufficient documentation

## 2020-01-18 DIAGNOSIS — G7001 Myasthenia gravis with (acute) exacerbation: Secondary | ICD-10-CM | POA: Diagnosis not present

## 2020-01-18 DIAGNOSIS — E1122 Type 2 diabetes mellitus with diabetic chronic kidney disease: Secondary | ICD-10-CM | POA: Insufficient documentation

## 2020-01-18 LAB — CBC WITH DIFFERENTIAL/PLATELET
Abs Immature Granulocytes: 0.06 10*3/uL (ref 0.00–0.07)
Basophils Absolute: 0.1 10*3/uL (ref 0.0–0.1)
Basophils Relative: 1 %
Eosinophils Absolute: 0.1 10*3/uL (ref 0.0–0.5)
Eosinophils Relative: 1 %
HCT: 40.4 % (ref 36.0–46.0)
Hemoglobin: 13.2 g/dL (ref 12.0–15.0)
Immature Granulocytes: 1 %
Lymphocytes Relative: 18 %
Lymphs Abs: 2 10*3/uL (ref 0.7–4.0)
MCH: 26.5 pg (ref 26.0–34.0)
MCHC: 32.7 g/dL (ref 30.0–36.0)
MCV: 81.1 fL (ref 80.0–100.0)
Monocytes Absolute: 0.5 10*3/uL (ref 0.1–1.0)
Monocytes Relative: 4 %
Neutro Abs: 8 10*3/uL — ABNORMAL HIGH (ref 1.7–7.7)
Neutrophils Relative %: 75 %
Platelets: 388 10*3/uL (ref 150–400)
RBC: 4.98 MIL/uL (ref 3.87–5.11)
RDW: 12.2 % (ref 11.5–15.5)
WBC: 10.6 10*3/uL — ABNORMAL HIGH (ref 4.0–10.5)
nRBC: 0 % (ref 0.0–0.2)

## 2020-01-18 LAB — BASIC METABOLIC PANEL
Anion gap: 8 (ref 5–15)
BUN: 13 mg/dL (ref 6–20)
CO2: 25 mmol/L (ref 22–32)
Calcium: 8.9 mg/dL (ref 8.9–10.3)
Chloride: 103 mmol/L (ref 98–111)
Creatinine, Ser: 0.76 mg/dL (ref 0.44–1.00)
GFR calc Af Amer: >60 mL/min (ref >60)
GFR calc non Af Amer: >60 mL/min (ref >60)
Glucose, Bld: 139 mg/dL — ABNORMAL HIGH (ref 70–99)
Potassium: 3.6 mmol/L (ref 3.5–5.1)
Sodium: 136 mmol/L (ref 135–145)

## 2020-01-18 MED ORDER — LIDOCAINE HCL (PF) 1 % IJ SOLN
5.0000 mL | Freq: Once | INTRAMUSCULAR | Status: DC
  Filled 2020-01-18: qty 5

## 2020-01-18 MED ORDER — METHYLPREDNISOLONE SODIUM SUCC 125 MG IJ SOLR: 125.0000 mg | Freq: Once | INTRAMUSCULAR | Status: DC

## 2020-01-18 MED ORDER — MAGNESIUM CITRATE PO SOLN
1.0000 | Freq: Once | ORAL | Status: AC
  Administered 2020-01-18: 13:00:00 1 via ORAL
  Filled 2020-01-18: qty 296

## 2020-01-18 MED ORDER — SODIUM CHLORIDE 0.9 % IV SOLN
500.0000 mg | Freq: Once | INTRAVENOUS | Status: AC
  Administered 2020-01-18: 500 mg via INTRAVENOUS
  Filled 2020-01-18: qty 4

## 2020-01-18 MED ORDER — PREDNISONE 5 MG PO TABS: 15.0000 mg | ORAL_TABLET | Freq: Every day | ORAL | 0 refills | Status: DC

## 2020-01-18 MED ORDER — PREDNISONE 5 MG PO TABS: 15.0000 mg | ORAL_TABLET | Freq: Every day | ORAL | 0 refills | Status: AC

## 2020-01-18 MED ORDER — DOCUSATE SODIUM 50 MG/5ML PO LIQD
100.0000 mg | Freq: Once | ORAL | Status: AC
  Administered 2020-01-18: 13:00:00 100 mg via ORAL
  Filled 2020-01-18: qty 10

## 2020-01-18 NOTE — ED Provider Notes (Signed)
[facility] Emergency Department Provider Note  ____________________________________________   First MD Initiated Contact with Patient 01/18/20 1030     (approximate)  I have reviewed the triage vital signs and the nursing notes.   HISTORY  Chief Complaint Toe Pain and Eye Problem    HPI [name] is a 34 y.o. female presents to the ED complaining of a toenail avulsion that is hanging on by the skin.  She is also complaining of constipation.  Third complaint is she needs a increase in her prednisone for her myasthenia gravis.  She states she is having some numbness to the left side of face.  She does not feel like she is headed into a crisis.  She states she just has not been on the right dose of prednisone.  She denies any fever or chills.  No vomiting.    Past Medical History:  Diagnosis Date  . BV (bacterial vaginosis)   . Chronic kidney disease    Frequent UTI  . Diabetes mellitus without complication (HCC)   . DUB (dysfunctional uterine bleeding)   . E. coli UTI (urinary tract infection)   . GERD (gastroesophageal reflux disease)   . H/O recurrent urinary tract infection   . H/O sickle cell trait   . Heavy periods   . Hypertension   . Hypokalemia   . Myasthenia gravis (HCC)   . Pneumonia January 12, 2016   [facility]  . PONV (postoperative nausea and vomiting)   . Psoriasis   . Spina bifida (HCC)   . Trichimoniasis   . Vaginal discharge   . Yeast infection     Patient Active Problem List   Diagnosis Date Noted  . UTI (urinary tract infection) 06/08/2019  . Knee clicking 08/12/2018  . Sickle cell trait (HCC) 01/10/2018  . Neurogenic bladder 12/27/2016  . Neurogenic bowel 12/27/2016  . Cerebral palsy (HCC) 12/12/2016  . Class 2 obesity 12/12/2016  . Encounter for surveillance of contraceptive pills 12/12/2016  . Muscle twitch 10/30/2016  . S/P cholecystectomy 03/27/2016  . Cholecystitis 02/02/2016  . Sepsis (HCC) 01/12/2016  .  Absolute anemia 08/17/2015  . Hay fever 05/17/2015  . Can't get food down 05/17/2015  . Secondary diabetes mellitus (HCC) 03/15/2015  . Other long term (current) drug therapy 04/20/2014  . Long term current use of systemic steroids 04/20/2014  . Controlled type 2 diabetes mellitus without complication (HCC) 03/17/2014  . Clinical depression 01/19/2014  . Adenopathy 01/19/2014  . CN (constipation) 08/12/2013  . Spina bifida (HCC) 08/12/2013  . Esophagitis, reflux 10/02/2011  . Avitaminosis D 10/02/2011  . Essential (primary) hypertension 05/15/2011  . Myasthenia gravis (HCC) 12/06/2010    Past Surgical History:  Procedure Laterality Date  . ADENOIDECTOMY    . BACK SURGERY    . CHOLECYSTECTOMY N/A 02/02/2016   Procedure: LAPAROSCOPIC CHOLECYSTECTOMY WITH CHOLANGIOGRAM;  Surgeon: Leafy Ro, MD;  Location: [facility] ORS;  Service: General;  Laterality: N/A;  . DILATION AND CURETTAGE OF UTERUS    . EXCISION NECK NODULE Left December 09, 2015   [facility]  . EYE SURGERY Right   . FOOT SURGERY Bilateral    [facility]  . HYSTEROSCOPY WITH D & C  2014   benign endometrial polyps  . TONSILLECTOMY      Prior to Admission medications   Medication Sig Start Date End Date Taking? Authorizing Provider  lisinopril (ZESTRIL) 10 MG tablet Take 2 tablets (20 mg total) by mouth daily. 06/11/19  Milagros Loll, MD  mycophenolate (CELLCEPT) 500 MG tablet Take 1,500 mg by mouth 2 (two) times daily.    [provider]  norethindrone-ethinyl estradiol (JUNEL FE 1/20) 1-20 MG-MCG tablet Take 1 tablet by mouth daily. 01/10/18   Defrancesco, Prentice Docker, MD  potassium chloride SA (K-DUR,KLOR-CON) 20 MEQ tablet Take 20 mEq by mouth daily.  12/13/18   [provider]  predniSONE (DELTASONE) 5 MG tablet Take 5 mg by mouth daily with breakfast.     [provider]  pyridostigmine (MESTINON) 60 MG tablet Take 60 mg by mouth every 4 (four) hours.    [provider]    ranitidine (ZANTAC) 150 MG tablet Take 150 mg by mouth daily.  08/29/18   [provider]  solifenacin (VESICARE) 10 MG tablet Take 20 mg by mouth daily.  11/05/18   [provider]    Allergies Iodinated diagnostic agents, Penicillins, Latex, and Metrizamide  Family History  Adopted: Yes  Family history unknown: Yes    Social History Social History   Tobacco Use  . Smoking status: Never Smoker  . Smokeless tobacco: Never Used  Substance Use Topics  . Alcohol use: No    Alcohol/week: 0.0 standard drinks  . Drug use: No    Review of Systems  Constitutional: No fever/chills Eyes: No visual changes. ENT: No sore throat. Respiratory: Denies cough Cardiovascular: Denies chest pain Gastrointestinal: Denies abdominal pain, positive for constipation Genitourinary: Negative for dysuria. Musculoskeletal: Negative for back pain. Skin: Negative for rash.  Positive for toenail avulsion Psychiatric: no mood changes,     ____________________________________________   PHYSICAL EXAM:  VITAL SIGNS: ED Triage Vitals  Enc Vitals Group     BP 01/18/20 0939 (!) 169/104     Pulse Rate 01/18/20 0939 83     Resp 01/18/20 0939 18     Temp 01/18/20 0939 98.6 F (37 C)     Temp Source 01/18/20 0939 Oral     SpO2 01/18/20 0939 100 %     Weight 01/18/20 0940 195 lb (88.5 kg)     Height 01/18/20 0940 5' (1.524 m)     Head Circumference --      Peak Flow --      Pain Score 01/18/20 0940 4     Pain Loc --      Pain Edu? --      Excl. in GC? --     Constitutional: Alert and oriented. Well appearing and in no acute distress. Eyes: Conjunctivae are normal.  Head: Atraumatic. Nose: No congestion/rhinnorhea. Mouth/Throat: Mucous membranes are moist.   Neck:  supple no lymphadenopathy noted Cardiovascular: Normal rate, regular rhythm. Heart sounds are normal Respiratory: Normal respiratory effort.  No retractions, lungs c t a  Abd: soft nontender bs normal all 4  quad GU: deferred Musculoskeletal: FROM all extremities, warm and well perfused, left great toenail is avulsed and hanging by the skin, area was excised, no bleeding is noted, neurovascular intact, patient tolerated procedure well Neurologic:  Normal speech and language.  Skin:  Skin is warm, dry and intact. No rash noted. Psychiatric: Mood and affect are normal. Speech and behavior are normal.  ____________________________________________   LABS (all labs ordered are listed, but only abnormal results are displayed)  Labs Reviewed  CBC WITH DIFFERENTIAL/PLATELET - Abnormal; Notable for the following components:      Result Value   WBC 10.6 (*)    Neutro Abs 8.0 (*)    All other components within normal  limits  BASIC METABOLIC PANEL - Abnormal; Notable for the following components:   Glucose, Bld 139 (*)    All other components within normal limits   ____________________________________________   ____________________________________________  RADIOLOGY  KUB shows moderate stool burden  ____________________________________________   PROCEDURES  Procedure(s) performed: Patient gave consent to have the toenail trimmed\excised.  Area was cut with wire cutters, no bleeding is noted, skin is still intact, neurovascular intact, patient tolerated procedure well   Procedures    ____________________________________________   INITIAL IMPRESSION / ASSESSMENT AND PLAN / ED COURSE  Pertinent labs & imaging results that were available during my care of the patient were reviewed by me and considered in my medical decision making (see chart for details).   Patient is 34 year old female presents emergency department with concerns of toenail avulsion and constipation along with a increase in her medication dose.  Physical exam patient appears well.  Toenail is almost completely avulsed is barely hanging on by the skin, remainder the exam is unremarkable.  See procedure note for toenail  removal  KUB shows moderate stool burden  In further discussion with the patient about her myasthenia gravis she admits she has stopped taking her medication.  She is now not able to see well out of the left eye is having increased left-sided facial pain.  I did call neurology to discuss the worsening symptoms.  I do not feel that she is in a crisis that she is not having difficulty breathing.  Up-to-date had said to give the patient 2 g of methylprednisolone.  I do not feel comfortable with this level and neither does the neurologist.  We will give her 500 mg Solu-Medrol IV and wait a few hours to give her another 500 mg IV.  If her vital capacity for her lungs is normal she can be discharged.  Patient care transferred to Dr. Alberteen Spindle.  I conveyed all this information to the patient. I conveyed all this information to Dr. Fuller Plan.  Patient be moved to the major side due to the amount of time concerning the administration of the medications.  If the vital capacity is normal should be able to be discharged.  Patient is to resume taking her medications.   [name] was evaluated in Emergency Department on 01/18/2020 for the symptoms described in the history of present illness. She was evaluated in the context of the global COVID-19 pandemic, which necessitated consideration that the patient might be at risk for infection with the SARS-CoV-2 virus that causes COVID-19. Institutional protocols and algorithms that pertain to the evaluation of patients at risk for COVID-19 are in a state of rapid change based on information released by regulatory bodies including the CDC and federal and state organizations. These policies and algorithms were followed during the patient's care in the ED.   As part of my medical decision making, I reviewed the following data within the electronic MEDICAL RECORD NUMBER Nursing notes reviewed and incorporated, Labs reviewed see above, Old chart reviewed, Patient signed out to Dr.  Fuller Plan, Radiograph reviewed KUB shows moderate stool burden, A consult was requested and obtained from this/these consultant(s) Neurology, Evaluated by EM attending Dr. Alfred Levins, Notes from prior ED visits and [place] Controlled Substance Database  ____________________________________________   FINAL CLINICAL IMPRESSION(S) / ED DIAGNOSES  Final diagnoses:  Toenail avulsion, initial encounter  Other constipation  Myasthenia gravis with exacerbation, adult form (HCC)      NEW MEDICATIONS STARTED DURING THIS VISIT:  New Prescriptions   No  medications on file     Note:  This document was prepared using Dragon voice recognition software and may include unintentional dictation errors.    Versie Starks, PA-C 01/18/20 1303    Vanessa Alvord, MD 01/18/20 1355

## 2020-01-18 NOTE — Discharge Instructions (Addendum)
We have treated you with some steroids for your myasthenia gravis.  I discussed with our neurologist who recommended you restart your 15 mg daily.  I did write you a new prescription in case she would run out of pills.  You should call your neurologist to get a follow-up sometime this week.  Return to the ER if you develop worsening shortness of breath or any other concerns.

## 2020-01-18 NOTE — ED Notes (Signed)
No dressing on toe, pt states that she will let it get air.  Gave pt a coke and helped her get into her shoes.

## 2020-01-18 NOTE — ED Notes (Signed)
See triage note  Presents with injury to right great toe  States she noticed her nail was coming off on great toe when she pulled off her socks  Also states she has not been able to have a bowel movement for about 1 month  No n/v

## 2020-01-18 NOTE — Progress Notes (Signed)
NIF >-40 cmh2o  

## 2020-01-18 NOTE — ED Triage Notes (Signed)
Pt arrived via POV with reports of nail coming of right great toe last night when pulling her sock off, pt states it is embedded in her skin.  Pt also states she needs a new prescription for prednisone for her Myasthenia Gravis, pt states she is not on the right dose right now and is causing a flare-up of MG.

## 2020-01-18 NOTE — ED Notes (Signed)
Pt reports improvement in vision since first dose of steroids, states she still has to squint when looking at her phone.

## 2020-01-18 NOTE — ED Provider Notes (Signed)
Patient is in no acute distress, states she is feeling some better.  She has received the second Solu-Medrol infusion.   Emily Filbert, MD 01/18/20 [phone]

## 2020-01-18 NOTE — ED Notes (Signed)
Pt resting quietly in room watching TV, pt had lunch tray and given warm blanket and extra pillow.  Waiting for 5pm dose of steroids.

## 2021-01-02 ENCOUNTER — Other Ambulatory Visit: Payer: Self-pay

## 2021-01-02 ENCOUNTER — Encounter: Payer: Self-pay | Admitting: Emergency Medicine

## 2021-01-02 ENCOUNTER — Emergency Department
Admission: EM | Admit: 2021-01-02 | Discharge: 2021-01-02 | Disposition: A | Discharge status: Home / Self Care | Payer: BC Managed Care – PPO | Attending: Student in an Organized Health Care Education/Training Program | Admitting: Student in an Organized Health Care Education/Training Program

## 2021-01-02 ENCOUNTER — Emergency Department: Payer: BC Managed Care – PPO

## 2021-01-02 DIAGNOSIS — Z79899 Other long term (current) drug therapy: Secondary | ICD-10-CM | POA: Insufficient documentation

## 2021-01-02 DIAGNOSIS — R0981 Nasal congestion: Secondary | ICD-10-CM | POA: Diagnosis present

## 2021-01-02 DIAGNOSIS — J069 Acute upper respiratory infection, unspecified: Secondary | ICD-10-CM

## 2021-01-02 DIAGNOSIS — E1122 Type 2 diabetes mellitus with diabetic chronic kidney disease: Secondary | ICD-10-CM | POA: Diagnosis not present

## 2021-01-02 DIAGNOSIS — N3000 Acute cystitis without hematuria: Secondary | ICD-10-CM | POA: Insufficient documentation

## 2021-01-02 DIAGNOSIS — U071 COVID-19: Secondary | ICD-10-CM | POA: Diagnosis not present

## 2021-01-02 DIAGNOSIS — N189 Chronic kidney disease, unspecified: Secondary | ICD-10-CM | POA: Insufficient documentation

## 2021-01-02 DIAGNOSIS — I129 Hypertensive chronic kidney disease with stage 1 through stage 4 chronic kidney disease, or unspecified chronic kidney disease: Secondary | ICD-10-CM | POA: Insufficient documentation

## 2021-01-02 DIAGNOSIS — Z91040 Latex allergy status: Secondary | ICD-10-CM | POA: Diagnosis not present

## 2021-01-02 LAB — URINALYSIS, COMPLETE (UACMP) WITH MICROSCOPIC
Bilirubin Urine: NEGATIVE
Glucose, UA: >=500 mg/dL — AB
Hgb urine dipstick: NEGATIVE
Ketones, ur: 5 mg/dL — AB
Nitrite: NEGATIVE
Protein, ur: 30 mg/dL — AB
Specific Gravity, Urine: 1.027 (ref 1.005–1.030)
WBC, UA: >50 WBC/hpf — ABNORMAL HIGH (ref 0–5)
pH: 5 (ref 5.0–8.0)

## 2021-01-02 LAB — COMPREHENSIVE METABOLIC PANEL
ALT: 10 U/L (ref 0–44)
AST: 13 U/L — ABNORMAL LOW (ref 15–41)
Albumin: 3.4 g/dL — ABNORMAL LOW (ref 3.5–5.0)
Alkaline Phosphatase: 48 U/L (ref 38–126)
Anion gap: 8 (ref 5–15)
BUN: 12 mg/dL (ref 6–20)
CO2: 28 mmol/L (ref 22–32)
Calcium: 8.6 mg/dL — ABNORMAL LOW (ref 8.9–10.3)
Chloride: 100 mmol/L (ref 98–111)
Creatinine, Ser: 0.78 mg/dL (ref 0.44–1.00)
GFR, Estimated: >60 mL/min (ref >60)
Glucose, Bld: 191 mg/dL — ABNORMAL HIGH (ref 70–99)
Potassium: 3.4 mmol/L — ABNORMAL LOW (ref 3.5–5.1)
Sodium: 136 mmol/L (ref 135–145)
Total Bilirubin: 0.6 mg/dL (ref 0.3–1.2)
Total Protein: 6.9 g/dL (ref 6.5–8.1)

## 2021-01-02 LAB — CBC WITH DIFFERENTIAL/PLATELET
Abs Immature Granulocytes: 0.07 10*3/uL (ref 0.00–0.07)
Basophils Absolute: 0.1 10*3/uL (ref 0.0–0.1)
Basophils Relative: 1 %
Eosinophils Absolute: 0.4 10*3/uL (ref 0.0–0.5)
Eosinophils Relative: 5 %
HCT: 38 % (ref 36.0–46.0)
Hemoglobin: 12.2 g/dL (ref 12.0–15.0)
Immature Granulocytes: 1 %
Lymphocytes Relative: 27 %
Lymphs Abs: 2.4 10*3/uL (ref 0.7–4.0)
MCH: 25.4 pg — ABNORMAL LOW (ref 26.0–34.0)
MCHC: 32.1 g/dL (ref 30.0–36.0)
MCV: 79.2 fL — ABNORMAL LOW (ref 80.0–100.0)
Monocytes Absolute: 1.4 10*3/uL — ABNORMAL HIGH (ref 0.1–1.0)
Monocytes Relative: 16 %
Neutro Abs: 4.5 10*3/uL (ref 1.7–7.7)
Neutrophils Relative %: 50 %
Platelets: 282 10*3/uL (ref 150–400)
RBC: 4.8 MIL/uL (ref 3.87–5.11)
RDW: 13.1 % (ref 11.5–15.5)
WBC: 8.9 10*3/uL (ref 4.0–10.5)
nRBC: 0 % (ref 0.0–0.2)

## 2021-01-02 LAB — POC URINE PREG, ED: Preg Test, Ur: NEGATIVE

## 2021-01-02 MED ORDER — CEPHALEXIN 500 MG PO CAPS: 500.0000 mg | ORAL_CAPSULE | Freq: Three times a day (TID) | ORAL | 0 refills | Status: AC

## 2021-01-02 MED ORDER — CEPHALEXIN 500 MG PO CAPS
500.0000 mg | ORAL_CAPSULE | Freq: Once | ORAL | Status: AC
  Administered 2021-01-02: 500 mg via ORAL
  Filled 2021-01-02: qty 1

## 2021-01-02 MED ORDER — POTASSIUM CHLORIDE CRYS ER 20 MEQ PO TBCR
20.0000 meq | EXTENDED_RELEASE_TABLET | Freq: Once | ORAL | Status: AC
  Administered 2021-01-02: 20 meq via ORAL
  Filled 2021-01-02: qty 1

## 2021-01-02 NOTE — Discharge Instructions (Signed)
Please check your Covid results tomorrow.  Please call your primary care doctor tomorrow for a follow-up appointment this week.

## 2021-01-02 NOTE — ED Notes (Signed)
Patient states that she has high BP,but did not take her medication this morning.

## 2021-01-02 NOTE — ED Provider Notes (Signed)
[facility] Emergency Department Provider Note  ____________________________________________  Time seen: Approximately 12:25 PM  I have reviewed the triage vital signs and the nursing notes.   HISTORY  Chief Complaint sneezing, Nasal Congestion (/), and Cough    HPI [name] is a 35 y.o. female with PMH myasthenia gravis, spina bifida, recurrent urinary tract infection, diabetes that presents to the emergency department for evaluation of nasal congestion and rhinorrhea for 2 days.  She also states that her urine has a malodor and is concerned that she has another urinary tract infection.  Patient Covid in September.  She reports that her doctor told her not to get vaccinated.  No fevers, shortness of breath, chest pain, vomiting, abdominal pain.  Past Medical History:  Diagnosis Date   BV (bacterial vaginosis)    Chronic kidney disease    Frequent UTI   Diabetes mellitus without complication (HCC)    DUB (dysfunctional uterine bleeding)    E. coli UTI (urinary tract infection)    GERD (gastroesophageal reflux disease)    H/O recurrent urinary tract infection    H/O sickle cell trait    Heavy periods    Hypertension    Hypokalemia    Myasthenia gravis (HCC)    Pneumonia January 12, 2016   [facility]   PONV (postoperative nausea and vomiting)    Psoriasis    Spina bifida (HCC)    Trichimoniasis    Vaginal discharge    Yeast infection     Patient Active Problem List   Diagnosis Date Noted   UTI (urinary tract infection) 06/08/2019   Knee clicking 08/12/2018   Sickle cell trait (HCC) 01/10/2018   Neurogenic bladder 12/27/2016   Neurogenic bowel 12/27/2016   Cerebral palsy (HCC) 12/12/2016   Class 2 obesity 12/12/2016   Encounter for surveillance of contraceptive pills 12/12/2016   Muscle twitch 10/30/2016   S/P cholecystectomy 03/27/2016   Cholecystitis 02/02/2016   Sepsis (HCC) 01/12/2016   Absolute anemia  08/17/2015   Hay fever 05/17/2015   Can't get food down 05/17/2015   Secondary diabetes mellitus (HCC) 03/15/2015   Other long term (current) drug therapy 04/20/2014   Long term current use of systemic steroids 04/20/2014   Controlled type 2 diabetes mellitus without complication (HCC) 03/17/2014   Clinical depression 01/19/2014   Adenopathy 01/19/2014   CN (constipation) 08/12/2013   Spina bifida (HCC) 08/12/2013   Esophagitis, reflux 10/02/2011   Avitaminosis D 10/02/2011   Essential (primary) hypertension 05/15/2011   Myasthenia gravis (HCC) 12/06/2010    Past Surgical History:  Procedure Laterality Date   ADENOIDECTOMY     BACK SURGERY     CHOLECYSTECTOMY N/A 02/02/2016   Procedure: LAPAROSCOPIC CHOLECYSTECTOMY WITH CHOLANGIOGRAM;  Surgeon: Leafy Ro, MD;  Location: [facility] ORS;  Service: General;  Laterality: N/A;   DILATION AND CURETTAGE OF UTERUS     EXCISION NECK NODULE Left December 09, 2015   [facility]   EYE SURGERY Right    FOOT SURGERY Bilateral    [facility]   HYSTEROSCOPY WITH D & C  2014   benign endometrial polyps   TONSILLECTOMY      Prior to Admission medications   Medication Sig Start Date End Date Taking? Authorizing Provider  cephALEXin (KEFLEX) 500 MG capsule Take 1 capsule (500 mg total) by mouth 3 (three) times daily for 7 days. 01/02/21 01/09/21 Yes Enid Derry, PA-C  lisinopril (ZESTRIL) 10 MG tablet Take 2 tablets (20 mg total) by  mouth daily. 06/11/19   Milagros Loll, MD  mycophenolate (CELLCEPT) 500 MG tablet Take 1,500 mg by mouth 2 (two) times daily.    [provider]  norethindrone-ethinyl estradiol (JUNEL FE 1/20) 1-20 MG-MCG tablet Take 1 tablet by mouth daily. 01/10/18   Defrancesco, Prentice Docker, MD  potassium chloride SA (K-DUR,KLOR-CON) 20 MEQ tablet Take 20 mEq by mouth daily.  12/13/18   [provider]  pyridostigmine (MESTINON) 60 MG tablet Take 60 mg by mouth every 4 (four) hours.     [provider]  ranitidine (ZANTAC) 150 MG tablet Take 150 mg by mouth daily.  08/29/18   [provider]  solifenacin (VESICARE) 10 MG tablet Take 20 mg by mouth daily.  11/05/18   [provider]    Allergies Iodinated diagnostic agents, Penicillins, Latex, and Metrizamide  Family History  Adopted: Yes  Family history unknown: Yes    Social History Social History   Tobacco Use   Smoking status: Never Smoker   Smokeless tobacco: Never Used  Building services engineer Use: Never used  Substance Use Topics   Alcohol use: No    Alcohol/week: 0.0 standard drinks   Drug use: No     Review of Systems  Constitutional: No fever/chills Eyes: No visual changes. No discharge. ENT: Positive for congestion and rhinorrhea. Cardiovascular: No chest pain. Respiratory: Negative for cough. No SOB. Gastrointestinal: No abdominal pain.  No nausea, no vomiting.  No diarrhea.  No constipation. Musculoskeletal: Negative for musculoskeletal pain. Skin: Negative for rash, abrasions, lacerations, ecchymosis. Neurological: Negative for headaches.   ____________________________________________   PHYSICAL EXAM:  VITAL SIGNS: ED Triage Vitals  Enc Vitals Group     BP 01/02/21 1045 (!) 163/111     Pulse Rate 01/02/21 1045 (!) 108     Resp 01/02/21 1045 17     Temp 01/02/21 1045 98.3 F (36.8 C)     Temp Source 01/02/21 1045 Oral     SpO2 01/02/21 1045 100 %     Weight 01/02/21 1056 200 lb (90.7 kg)     Height 01/02/21 1056 5' (1.524 m)     Head Circumference --      Peak Flow --      Pain Score 01/02/21 1055 0     Pain Loc --      Pain Edu? --      Excl. in GC? --      Constitutional: Alert and oriented. Well appearing and in no acute distress. Eyes: Conjunctivae are normal. PERRL. EOMI. No discharge. Head: Atraumatic. ENT: No frontal and maxillary sinus tenderness.      Ears: Tympanic membranes pearly gray with good landmarks. No discharge.      Nose:  Mild congestion/rhinnorhea.      Mouth/Throat: Mucous membranes are moist. Oropharynx non-erythematous. Tonsils not enlarged. No exudates. Uvula midline. Neck: No stridor.   Hematological/Lymphatic/Immunilogical: No cervical lymphadenopathy. Cardiovascular: Normal rate, regular rhythm.  Good peripheral circulation. Respiratory: Normal respiratory effort without tachypnea or retractions. Lungs CTAB. Good air entry to the bases with no decreased or absent breath sounds. Gastrointestinal: Bowel sounds 4 quadrants. Soft and nontender to palpation. No guarding or rigidity. No palpable masses. No distention. Musculoskeletal: Full range of motion to all extremities. No gross deformities appreciated. Neurologic:  Normal speech and language. No gross focal neurologic deficits are appreciated.  Skin:  Skin is warm, dry and intact. No rash noted. Psychiatric: Mood and affect are normal. Speech and behavior are normal. Patient exhibits  appropriate insight and judgement.   ____________________________________________   LABS (all labs ordered are listed, but only abnormal results are displayed)  Labs Reviewed  CBC WITH DIFFERENTIAL/PLATELET - Abnormal; Notable for the following components:      Result Value   MCV 79.2 (*)    MCH 25.4 (*)    Monocytes Absolute 1.4 (*)    All other components within normal limits  COMPREHENSIVE METABOLIC PANEL - Abnormal; Notable for the following components:   Potassium 3.4 (*)    Glucose, Bld 191 (*)    Calcium 8.6 (*)    Albumin 3.4 (*)    AST 13 (*)    All other components within normal limits  URINALYSIS, COMPLETE (UACMP) WITH MICROSCOPIC - Abnormal; Notable for the following components:   Color, Urine AMBER (*)    APPearance CLOUDY (*)    Glucose, UA >=500 (*)    Ketones, ur 5 (*)    Protein, ur 30 (*)    Leukocytes,Ua SMALL (*)    WBC, UA >50 (*)    Bacteria, UA MANY (*)    All other components within normal limits  SARS CORONAVIRUS 2 (TAT 6-24 HRS)   URINE CULTURE  POC URINE PREG, ED  POC SARS CORONAVIRUS 2 AG -  ED   ____________________________________________  EKG   ____________________________________________  RADIOLOGY Lexine BatonI, Jabreel Chimento, personally viewed and evaluated these images (plain radiographs) as part of my medical decision making, as well as reviewing the written report by the radiologist.  DG Chest 2 View  Result Date: 01/02/2021 CLINICAL DATA:  Cold symptoms EXAM: CHEST - 2 VIEW COMPARISON:  November 29, 2016 FINDINGS: The cardiomediastinal silhouette is normal in contour. No pleural effusion. No pneumothorax. No acute pleuroparenchymal abnormality. Status post cholecystectomy. No acute osseous abnormality noted. IMPRESSION: No acute cardiopulmonary abnormality. Electronically Signed   By: Meda KlinefelterStephanie  Peacock MD   On: 01/02/2021 12:34    ____________________________________________    PROCEDURES  Procedure(s) performed:    Procedures    Medications  cephALEXin (KEFLEX) capsule 500 mg (has no administration in time range)  potassium chloride SA (KLOR-CON) CR tablet 20 mEq (has no administration in time range)     ____________________________________________   INITIAL IMPRESSION / ASSESSMENT AND PLAN / ED COURSE  Pertinent labs & imaging results that were available during my care of the patient were reviewed by me and considered in my medical decision making (see chart for details).  Review of the [place] CSRS was performed in accordance of the [facility] prior to dispensing any controlled drugs.   Patient's diagnosis is consistent with viral URI and urinary tract infection. Vital signs and exam are reassuring.  Urinalysis is consistent with an infection.  Patient is allergic to amoxicillin but she had Keflex in September without any difficulties.  She will be started on Keflex for her urinary tract infection and was given a dose in the ED.  She denies any systemic symptoms. Patient denies any fevers, she is  afebrile in the emergency department and does not have any leukocytosis.  She is not septic.  Her chest x-ray is negative for acute cardiopulmonary processes.  Her Covid test is pending.  We are unable to do a rapid Covid test in the emergency department due to our supply.  Patient feels comfortable going home. Patient will be discharged home with prescriptions for Keflex. Patient is to follow up with primary care as needed or otherwise directed. Patient is given ED precautions to return to the ED for any  worsening or new symptoms.  [name] was evaluated in Emergency Department on 01/02/2021 for the symptoms described in the history of present illness. She was evaluated in the context of the global COVID-19 pandemic, which necessitated consideration that the patient might be at risk for infection with the SARS-CoV-2 virus that causes COVID-19. Institutional protocols and algorithms that pertain to the evaluation of patients at risk for COVID-19 are in a state of rapid change based on information released by regulatory bodies including the CDC and federal and state organizations. These policies and algorithms were followed during the patient's care in the ED.   ____________________________________________  FINAL CLINICAL IMPRESSION(S) / ED DIAGNOSES  Final diagnoses:  Upper respiratory tract infection, unspecified type  Acute cystitis without hematuria      NEW MEDICATIONS STARTED DURING THIS VISIT:  ED Discharge Orders         Ordered    cephALEXin (KEFLEX) 500 MG capsule  3 times daily        01/02/21 1456              This chart was dictated using voice recognition software/Dragon. Despite best efforts to proofread, errors can occur which can change the meaning. Any change was purely unintentional.    Enid Derry, PA-C 01/02/21 1742    Willy Eddy, MD 01/06/21 [phone]

## 2021-01-02 NOTE — ED Triage Notes (Signed)
Pt reports for the past 3 days she has been sneezing, coughing and congested. Pt reports just wants to make sure she is not contagious

## 2021-01-02 NOTE — ED Notes (Signed)
Patient refusing covid swab. Morrie Sheldon PA made aware

## 2021-01-03 LAB — SARS CORONAVIRUS 2 (TAT 6-24 HRS): SARS Coronavirus 2: POSITIVE — AB

## 2021-01-05 LAB — URINE CULTURE: Culture: >=100000 — AB

## 2021-05-11 ENCOUNTER — Emergency Department: Payer: BC Managed Care – PPO

## 2021-05-11 ENCOUNTER — Other Ambulatory Visit: Payer: Self-pay

## 2021-05-11 ENCOUNTER — Emergency Department
Admission: EM | Admit: 2021-05-11 | Discharge: 2021-05-11 | Disposition: A | Discharge status: Home / Self Care | Payer: BC Managed Care – PPO | Attending: Emergency Medicine | Admitting: Emergency Medicine

## 2021-05-11 DIAGNOSIS — N189 Chronic kidney disease, unspecified: Secondary | ICD-10-CM | POA: Insufficient documentation

## 2021-05-11 DIAGNOSIS — E1122 Type 2 diabetes mellitus with diabetic chronic kidney disease: Secondary | ICD-10-CM | POA: Diagnosis not present

## 2021-05-11 DIAGNOSIS — Z91040 Latex allergy status: Secondary | ICD-10-CM | POA: Insufficient documentation

## 2021-05-11 DIAGNOSIS — S39012A Strain of muscle, fascia and tendon of lower back, initial encounter: Secondary | ICD-10-CM | POA: Diagnosis not present

## 2021-05-11 DIAGNOSIS — S34109A Unspecified injury to unspecified level of lumbar spinal cord, initial encounter: Secondary | ICD-10-CM | POA: Diagnosis present

## 2021-05-11 DIAGNOSIS — I129 Hypertensive chronic kidney disease with stage 1 through stage 4 chronic kidney disease, or unspecified chronic kidney disease: Secondary | ICD-10-CM | POA: Diagnosis not present

## 2021-05-11 DIAGNOSIS — W108XXA Fall (on) (from) other stairs and steps, initial encounter: Secondary | ICD-10-CM | POA: Diagnosis not present

## 2021-05-11 DIAGNOSIS — Z79899 Other long term (current) drug therapy: Secondary | ICD-10-CM | POA: Insufficient documentation

## 2021-05-11 MED ORDER — HYDROCODONE-ACETAMINOPHEN 5-325 MG PO TABS
1.0000 | ORAL_TABLET | Freq: Once | ORAL | Status: AC
  Administered 2021-05-11: 1 via ORAL
  Filled 2021-05-11: qty 1

## 2021-05-11 MED ORDER — METHOCARBAMOL 500 MG PO TABS: ORAL_TABLET | ORAL | 0 refills | Status: DC

## 2021-05-11 MED ORDER — METHOCARBAMOL 500 MG PO TABS
1000.0000 mg | ORAL_TABLET | Freq: Once | ORAL | Status: AC
  Administered 2021-05-11: 1000 mg via ORAL
  Filled 2021-05-11: qty 2

## 2021-05-11 MED ORDER — HYDROCODONE-ACETAMINOPHEN 5-325 MG PO TABS: 1.0000 | ORAL_TABLET | Freq: Four times a day (QID) | ORAL | 0 refills | Status: DC | PRN

## 2021-05-11 NOTE — Discharge Instructions (Signed)
Follow-up with your primary care provider if any continued problems or concerns.  You may use ice or heat to your back as needed for discomfort.  A prescription for hydrocodone and methocarbamol was sent to your pharmacy.  The hydrocodone is for pain and the methocarbamol is a muscle relaxant.  Muscle relaxant can be taken 1 or 2 every 6 hours as needed for muscle spasms.  It is not unusual to have discomfort for 3 to 4 days after falling and you may have some stiffness.

## 2021-05-11 NOTE — ED Triage Notes (Signed)
Pt comes with c/o fall this am. Pt states she is having lower back pain and some knee pain.

## 2021-05-11 NOTE — ED Provider Notes (Signed)
[facility] Emergency Department Provider Note  ____________________________________________   Event Date/Time   First MD Initiated Contact with Patient 05/11/21 1240     (approximate)  I have reviewed the triage vital signs and the nursing notes.   HISTORY  Chief Complaint Fall   HPI [name] is a 35 y.o. female presents to the ED after falling down steps this morning.  Patient states that she was leaving her home to go to work when she fell down a flight of steps.  She denies any head injury or loss of consciousness.  Patient complains of low back pain with pain radiating into bilateral legs.  She denies any direct trauma to her knees.  Patient has a history of spina bifida and wears braces on her legs.  She has been ambulatory since her accident.  She states that some muscle spasms in her back that is causing her pain.  She rates her pain as 10/10.       Past Medical History:  Diagnosis Date  . BV (bacterial vaginosis)   . Chronic kidney disease    Frequent UTI  . Diabetes mellitus without complication (HCC)   . DUB (dysfunctional uterine bleeding)   . E. coli UTI (urinary tract infection)   . GERD (gastroesophageal reflux disease)   . H/O recurrent urinary tract infection   . H/O sickle cell trait   . Heavy periods   . Hypertension   . Hypokalemia   . Myasthenia gravis (HCC)   . Pneumonia January 12, 2016   [facility]  . PONV (postoperative nausea and vomiting)   . Psoriasis   . Spina bifida (HCC)   . Trichimoniasis   . Vaginal discharge   . Yeast infection     Patient Active Problem List   Diagnosis Date Noted  . UTI (urinary tract infection) 06/08/2019  . Knee clicking 08/12/2018  . Sickle cell trait (HCC) 01/10/2018  . Neurogenic bladder 12/27/2016  . Neurogenic bowel 12/27/2016  . Cerebral palsy (HCC) 12/12/2016  . Class 2 obesity 12/12/2016  . Encounter for surveillance of contraceptive pills 12/12/2016  . Muscle twitch  10/30/2016  . S/P cholecystectomy 03/27/2016  . Cholecystitis 02/02/2016  . Sepsis (HCC) 01/12/2016  . Absolute anemia 08/17/2015  . Hay fever 05/17/2015  . Can't get food down 05/17/2015  . Secondary diabetes mellitus (HCC) 03/15/2015  . Other long term (current) drug therapy 04/20/2014  . Long term current use of systemic steroids 04/20/2014  . Controlled type 2 diabetes mellitus without complication (HCC) 03/17/2014  . Clinical depression 01/19/2014  . Adenopathy 01/19/2014  . CN (constipation) 08/12/2013  . Spina bifida (HCC) 08/12/2013  . Esophagitis, reflux 10/02/2011  . Avitaminosis D 10/02/2011  . Essential (primary) hypertension 05/15/2011  . Myasthenia gravis (HCC) 12/06/2010    Past Surgical History:  Procedure Laterality Date  . ADENOIDECTOMY    . BACK SURGERY    . CHOLECYSTECTOMY N/A 02/02/2016   Procedure: LAPAROSCOPIC CHOLECYSTECTOMY WITH CHOLANGIOGRAM;  Surgeon: Leafy Ro, MD;  Location: [facility] ORS;  Service: General;  Laterality: N/A;  . DILATION AND CURETTAGE OF UTERUS    . EXCISION NECK NODULE Left December 09, 2015   [facility]  . EYE SURGERY Right   . FOOT SURGERY Bilateral    [facility]  . HYSTEROSCOPY WITH D & C  2014   benign endometrial polyps  . TONSILLECTOMY      Prior to Admission medications   Medication Sig Start Date  End Date Taking? Authorizing Provider  HYDROcodone-acetaminophen (NORCO/VICODIN) 5-325 MG tablet Take 1 tablet by mouth every 6 (six) hours as needed for moderate pain. 05/11/21 05/11/22 Yes Christino Mcglinchey L, PA-C  methocarbamol (ROBAXIN) 500 MG tablet 1-2 tablets every 6 hours prn muscle spasms. 05/11/21  Yes Bridget HartshornSummers, Teonna Coonan L, PA-C  lisinopril (ZESTRIL) 10 MG tablet Take 2 tablets (20 mg total) by mouth daily. 06/11/19   Milagros LollSudini, Srikar, MD  mycophenolate (CELLCEPT) 500 MG tablet Take 1,500 mg by mouth 2 (two) times daily.    [provider]  norethindrone-ethinyl estradiol (JUNEL FE 1/20) 1-20 MG-MCG tablet  Take 1 tablet by mouth daily. 01/10/18   Defrancesco, Prentice DockerMartin A, MD  potassium chloride SA (K-DUR,KLOR-CON) 20 MEQ tablet Take 20 mEq by mouth daily.  12/13/18   [provider]  pyridostigmine (MESTINON) 60 MG tablet Take 60 mg by mouth every 4 (four) hours.    [provider]  ranitidine (ZANTAC) 150 MG tablet Take 150 mg by mouth daily.  08/29/18   [provider]  solifenacin (VESICARE) 10 MG tablet Take 20 mg by mouth daily.  11/05/18   [provider]    Allergies Iodinated diagnostic agents, Penicillins, Latex, and Metrizamide  Family History  Adopted: Yes  Family history unknown: Yes    Social History Social History   Tobacco Use  . Smoking status: Never Smoker  . Smokeless tobacco: Never Used  Vaping Use  . Vaping Use: Never used  Substance Use Topics  . Alcohol use: No    Alcohol/week: 0.0 standard drinks  . Drug use: No    Review of Systems Constitutional: No fever/chills Eyes: No visual changes. ENT: No trauma. Cardiovascular: Denies chest pain. Respiratory: Denies shortness of breath. Gastrointestinal: No abdominal pain.  No nausea, no vomiting.  No diarrhea. Genitourinary: Negative for dysuria. Musculoskeletal: Positive for low back pain. Skin: Negative for rash. Neurological: Negative for headaches, focal weakness or numbness.  ____________________________________________   PHYSICAL EXAM:  VITAL SIGNS: ED Triage Vitals  Enc Vitals Group     BP 05/11/21 1207 (!) 161/104     Pulse Rate 05/11/21 1207 93     Resp 05/11/21 1207 18     Temp 05/11/21 1207 98.4 F (36.9 C)     Temp Source 05/11/21 1207 Oral     SpO2 05/11/21 1207 97 %     Weight --      Height --      Head Circumference --      Peak Flow --      Pain Score 05/11/21 1207 10     Pain Loc --      Pain Edu? --      Excl. in [place]? --     Constitutional: Alert and oriented. Well appearing and in no acute distress. Eyes: Conjunctivae are normal. PERRL.  EOMI. Head: Atraumatic. Nose: No trauma. Mouth/Throat: No trauma. Neck: No stridor.  No tenderness is noted on palpation of cervical spine posteriorly. Cardiovascular: Normal rate, regular rhythm. Grossly normal heart sounds.  Good peripheral circulation. Respiratory: Normal respiratory effort.  No retractions. Lungs CTAB. Gastrointestinal: Soft and nontender. No distention.  Bowel sounds normoactive x4 quadrants. Musculoskeletal: No tenderness is noted on palpation of the thoracic spine however there is moderate tenderness on palpation of the lumbar spine.  No step-offs were appreciated.  There is paravertebral muscle tenderness with restricted range of motion secondary to muscle spasms in the lumbar region.  No abrasions or skin discoloration is noted.  No  tenderness or decreased range of motion with the upper extremities.  Patient wears braces on her lower extremities and no evidence of trauma noted during exam. Neurologic:  Normal speech and language. No gross focal neurologic deficits are appreciated.  Skin:  Skin is warm, dry and intact. No rash noted. Psychiatric: Mood and affect are normal. Speech and behavior are normal.  ____________________________________________   LABS (all labs ordered are listed, but only abnormal results are displayed)  Labs Reviewed - No data to display ____________________________________________   RADIOLOGY I, Tommi Rumps, personally viewed and evaluated these images (plain radiographs) as part of my medical decision making, as well as reviewing the written report by the radiologist.   Official radiology report(s): DG Lumbar Spine 2-3 Views  Result Date: 05/11/2021 CLINICAL DATA:  Status post fall. Low back pain. Pain radiates into right anterior upper leg. EXAM: LUMBAR SPINE - 2-3 VIEW COMPARISON:  11/10/2013 FINDINGS: Mild curvature of the lower lumbar spine is convex towards the left. The alignment is otherwise within normal limits. The  vertebral body heights and disc spaces are well preserved. No signs of fracture or dislocation. Lack of fusion of the posterior elements of L4, L5 and sacrum again noted. IMPRESSION: 1. No acute findings. 2. Mild curvature of the lower lumbar spine. 3. Lack of fusion of the posterior elements of L4, L5 and sacrum. Electronically Signed   By: Signa Kell M.D.   On: 05/11/2021 14:02    ____________________________________________   PROCEDURES  Procedure(s) performed (including Critical Care):  Procedures   ____________________________________________   INITIAL IMPRESSION / ASSESSMENT AND PLAN / ED COURSE  As part of my medical decision making, I reviewed the following data within the electronic MEDICAL RECORD NUMBER Notes from prior ED visits and [place] Controlled Substance Database  35 year old female presents to the ED after she fell at home going down the steps.  Her main concern is the back pain and muscle spasms that she is having currently.  Patient has a history of spina bifida.  She denies any head injury or loss of consciousness.  Physical exam is consistent with tenderness and muscle spasms of the lumbar area.  X-rays were negative for any acute bony changes.  Patient was given hydrocodone-acetaminophen and methocarbamol while in the ED and was getting moderate relief of her muscle spasms and pain prior to discharge.  We discussed staying out of work the next 2 days and she is encouraged to use ice or heat to her back as needed for discomfort.  Same medication was sent to her pharmacy.  Patient does not drive but she still was made aware that this medication could cause drowsiness and increase her risk for injury.  She is return to the emergency department if any urgent concerns or worsening of her symptoms.  ____________________________________________   FINAL CLINICAL IMPRESSION(S) / ED DIAGNOSES  Final diagnoses:  Lumbar strain, initial encounter  Fall (on) (from) other stairs and  steps, initial encounter     ED Discharge Orders         Ordered    HYDROcodone-acetaminophen (NORCO/VICODIN) 5-325 MG tablet  Every 6 hours PRN        05/11/21 1412    methocarbamol (ROBAXIN) 500 MG tablet        05/11/21 1412          *Please note:  [name] was evaluated in Emergency Department on 05/11/2021 for the symptoms described in the history of present illness. She was evaluated in the  context of the global COVID-19 pandemic, which necessitated consideration that the patient might be at risk for infection with the SARS-CoV-2 virus that causes COVID-19. Institutional protocols and algorithms that pertain to the evaluation of patients at risk for COVID-19 are in a state of rapid change based on information released by regulatory bodies including the CDC and federal and state organizations. These policies and algorithms were followed during the patient's care in the ED.  Some ED evaluations and interventions may be delayed as a result of limited staffing during and the pandemic.*   Note:  This document was prepared using Dragon voice recognition software and may include unintentional dictation errors.    Tommi Rumps, PA-C 05/11/21 1423    Dionne Bucy, MD 05/11/21 1504

## 2021-05-11 NOTE — ED Notes (Signed)
Pt has consented to receiving discharge instructions. Pt asked for husband to wheel her out in [facility].

## 2021-05-11 NOTE — ED Notes (Signed)
Pt has been provided with discharge instructions. Pt denies any questions or concerns at this time. Pt verbalizes understanding for follow up care and d/c.  VSS.  Pt left department with all belongings.  

## 2022-01-07 ENCOUNTER — Ambulatory Visit
Admission: EM | Admit: 2022-01-07 | Discharge: 2022-01-07 | Disposition: A | Discharge status: Home / Self Care | Payer: Medicaid Other

## 2022-01-07 ENCOUNTER — Other Ambulatory Visit: Payer: Self-pay

## 2022-01-07 ENCOUNTER — Encounter: Payer: Self-pay | Admitting: Emergency Medicine

## 2022-01-07 DIAGNOSIS — L03115 Cellulitis of right lower limb: Secondary | ICD-10-CM

## 2022-01-07 MED ORDER — DOXYCYCLINE HYCLATE 100 MG PO CAPS: 100.0000 mg | ORAL_CAPSULE | Freq: Two times a day (BID) | ORAL | 0 refills | Status: DC

## 2022-01-07 NOTE — Discharge Instructions (Addendum)
.  Take the Doxycycline twice daily with food for 10 days.  Doxycycline will make you more sensitive to sunburn so wear sunscreen when outdoors and reapply it every 90 minutes.  Apply warm compresses to help promote drainage.  Use OTC Tylenol and Ibuprofen according to the package instructions as needed for pain.  Do not wear your brace for the next two days so that the wound can get some air and dry out.  Keep your right leg elevated as much as possible to decrease swelling, improve blood flow and aid in healing.  Follow-up with your Orthotics and prostentics provider to evaluate your brace to see if it is too tight.  Wear the post-op shoe as needed for ambulation until your wound heals and your brace is adjusted.   Return for new or worsening symptoms.

## 2022-01-07 NOTE — ED Triage Notes (Signed)
Patient states that she has spina bifida and got new straps for her leg braces on Monday.  Patient reports redness, swelling and tenderness in her right lower leg on Tuesday.  Patient not sure if she got bit my something.  Patient states it looks like an abscess.

## 2022-01-07 NOTE — ED Provider Notes (Signed)
[facility]    CSN: [id number] Arrival date & time: 01/07/22  0817      History   Chief Complaint Chief Complaint  Patient presents with   Leg Pain    HPI [name] is a 36 y.o. female.   HPI  36 year old female here for evaluation of integumentary complaints.  Patient reports that she received new braces and straps for her foot and lower leg braces 5 days ago.  4 days ago she noticed redness and swelling to the outside of her right calf where the top of her brace and strap lie.  She states that she went to physical therapy and was advised to switch back to her old braces until she could have the new braces adjusted, which she did.  This morning when she woke up there was increased redness, tenderness, swelling, and a blister had formed on the outside of her right calf.  This blister ruptured after taking a shower and had some bloody drainage come from the wound.  Patient denies any fever.  She has a history of spina bifida so she does not have any sensation in her feet but she does have sensation in her calf which she reports is tender to touch.  Past Medical History:  Diagnosis Date   BV (bacterial vaginosis)    Chronic kidney disease    Frequent UTI   Diabetes mellitus without complication ([place])    DUB (dysfunctional uterine bleeding)    E. coli UTI (urinary tract infection)    GERD (gastroesophageal reflux disease)    H/O recurrent urinary tract infection    H/O sickle cell trait    Heavy periods    Hypertension    Hypokalemia    Myasthenia gravis ([place])    Pneumonia January 12, 2016   [facility]   PONV (postoperative nausea and vomiting)    Psoriasis    Spina bifida ([place])    Trichimoniasis    Vaginal discharge    Yeast infection     Patient Active Problem List   Diagnosis Date Noted   UTI (urinary tract infection) 99991111   Knee clicking 0000000   Sickle cell trait ([place]) 01/10/2018   Neurogenic bladder 12/27/2016   Neurogenic bowel  12/27/2016   Cerebral palsy ([place]) 12/12/2016   Class 2 obesity 12/12/2016   Encounter for surveillance of contraceptive pills 12/12/2016   Muscle twitch 10/30/2016   S/P cholecystectomy 03/27/2016   Cholecystitis 02/02/2016   Sepsis ([place]) 01/12/2016   Absolute anemia 08/17/2015   Hay fever 05/17/2015   Can't get food down 05/17/2015   Secondary diabetes mellitus ([place]) 03/15/2015   Other long term (current) drug therapy 04/20/2014   Long term current use of systemic steroids 04/20/2014   Controlled type 2 diabetes mellitus without complication ([place]) 123XX123   Clinical depression 01/19/2014   Adenopathy 01/19/2014   CN (constipation) 08/12/2013   Spina bifida ([place]) 08/12/2013   Esophagitis, reflux 10/02/2011   Avitaminosis D 10/02/2011   Essential (primary) hypertension 05/15/2011   Myasthenia gravis ([place]) 12/06/2010    Past Surgical History:  Procedure Laterality Date   ADENOIDECTOMY     BACK SURGERY     CHOLECYSTECTOMY N/A 02/02/2016   Procedure: LAPAROSCOPIC CHOLECYSTECTOMY WITH CHOLANGIOGRAM;  Surgeon: Jules Husbands, MD;  Location: [facility] ORS;  Service: General;  Laterality: N/A;   DILATION AND CURETTAGE OF UTERUS     EXCISION NECK NODULE Left December 09, 2015   [facility]   EYE SURGERY Right  FOOT SURGERY Bilateral    [facility]   HYSTEROSCOPY WITH D & C  2014   benign endometrial polyps   TONSILLECTOMY      OB History     Gravida  0   Para  0   Term  0   Preterm  0   AB  0   Living  0      SAB  0   IAB  0   Ectopic  0   Multiple  0   Live Births               Home Medications    Prior to Admission medications   Medication Sig Start Date End Date Taking? Authorizing Provider  doxycycline (VIBRAMYCIN) 100 MG capsule Take 1 capsule (100 mg total) by mouth 2 (two) times daily. 01/07/22  Yes Margarette Canada, NP  lisinopril (ZESTRIL) 10 MG tablet Take 2 tablets (20 mg total) by mouth daily. 06/11/19  Yes Sudini, Alveta Heimlich, MD   methocarbamol (ROBAXIN) 500 MG tablet 1-2 tablets every 6 hours prn muscle spasms. 05/11/21  Yes Johnn Hai, PA-C  mycophenolate (CELLCEPT) 500 MG tablet Take 1,500 mg by mouth 2 (two) times daily.   Yes [provider]  potassium chloride SA (K-DUR,KLOR-CON) 20 MEQ tablet Take 20 mEq by mouth daily.  12/13/18  Yes [provider]  predniSONE (DELTASONE) 5 MG tablet 3 pills daily 04/11/21  Yes [provider]  pyridostigmine (MESTINON) 60 MG tablet Take 60 mg by mouth every 4 (four) hours.   Yes [provider]  solifenacin (VESICARE) 10 MG tablet Take 20 mg by mouth daily.  11/05/18  Yes [provider]  HYDROcodone-acetaminophen (NORCO/VICODIN) 5-325 MG tablet Take 1 tablet by mouth every 6 (six) hours as needed for moderate pain. 05/11/21 05/11/22  Johnn Hai, PA-C    Family History Family History  Adopted: Yes  Family history unknown: Yes    Social History Social History   Tobacco Use   Smoking status: Never   Smokeless tobacco: Never  Vaping Use   Vaping Use: Never used  Substance Use Topics   Alcohol use: No    Alcohol/week: 0.0 standard drinks   Drug use: No     Allergies   Iodinated contrast media, Penicillins, Latex, and Metrizamide   Review of Systems Review of Systems  Constitutional:  Negative for fever.  Skin:  Positive for color change and wound.  Neurological:  Positive for numbness.  Hematological: Negative.     Physical Exam Triage Vital Signs ED Triage Vitals  Enc Vitals Group     BP 01/07/22 0838 (!) 145/100     Pulse Rate 01/07/22 0838 83     Resp 01/07/22 0838 14     Temp 01/07/22 0838 98.9 F (37.2 C)     Temp Source 01/07/22 0838 Oral     SpO2 01/07/22 0838 98 %     Weight 01/07/22 0834 200 lb (90.7 kg)     Height 01/07/22 0834 5' (1.524 m)     Head Circumference --      Peak Flow --      Pain Score 01/07/22 0833 8     Pain Loc --      Pain Edu? --      Excl. in [place]? --    No  data found.  Updated Vital Signs BP (!) 145/100 (BP Location: Left Arm)    Pulse 83    Temp 98.9 F (  37.2 C) (Oral)    Resp 14    Ht 5' (1.524 m)    Wt 200 lb (90.7 kg)    LMP 12/20/2021 (Approximate)    SpO2 98%    BMI 39.06 kg/m   Visual Acuity Right Eye Distance:   Left Eye Distance:   Bilateral Distance:    Right Eye Near:   Left Eye Near:    Bilateral Near:     Physical Exam Vitals and nursing note reviewed.  Constitutional:      General: She is not in acute distress.    Appearance: Normal appearance. She is not ill-appearing.  HENT:     Head: Normocephalic and atraumatic.  Skin:    General: Skin is warm.     Capillary Refill: Capillary refill takes less than 2 seconds.     Findings: Erythema and lesion present.  Neurological:     General: No focal deficit present.     Mental Status: She is alert and oriented to person, place, and time.  Psychiatric:        Mood and Affect: Mood normal.        Behavior: Behavior normal.        Thought Content: Thought content normal.        Judgment: Judgment normal.     UC Treatments / Results  Labs (all labs ordered are listed, but only abnormal results are displayed) Labs Reviewed - No data to display  EKG   Radiology No results found.  Procedures Procedures (including critical care time)  Medications Ordered in UC Medications - No data to display  Initial Impression / Assessment and Plan / UC Course  I have reviewed the triage vital signs and the nursing notes.  Pertinent labs & imaging results that were available during my care of the patient were reviewed by me and considered in my medical decision making (see chart for details).  Patient is a nontoxic-appearing 74 old female here for evaluation of redness, swelling, tenderness, drainage from her right lateral calf that began 4 days ago.  The blister formation and wound drainage was just noticed this morning when she woke up and then the drainage occurred after  she took a shower.  The superior lateral aspect of the right calf is erythematous, edematous, hot, and tender to touch.  There is a ruptured blister/pustule on the outside of the leg.  My suspicion is that patient's new braces may be too tight and where the blister is formed is where the rib it lies for her leg retention strep.  I have advised her to stay out of her braces until she can be evaluated by prosthetics and orthotics and have them adjusted.  I have also encouraged her to keep her right leg elevated as much as possible to decrease swelling, improve blood flow, and aid in healing.  I also want the wound open to air is much as possible so as to help dry it out and help her heal.  I will place the patient on doxycycline twice daily for 10 days for treatment of cellulitis.  I have written the patient a handwritten prescription for a DME evaluation as she wishes to switch to a brace shop closer to home in [place] versus going to [facility].  We will also provide postop shoe to help provide foot ankle support for ambulation as necessary and prevent any further injury from her ankle brace.   Final Clinical Impressions(s) / UC Diagnoses   Final diagnoses:  Cellulitis of right lower extremity     Discharge Instructions      .Take the Doxycycline twice daily with food for 10 days.  Doxycycline will make you more sensitive to sunburn so wear sunscreen when outdoors and reapply it every 90 minutes.  Apply warm compresses to help promote drainage.  Use OTC Tylenol and Ibuprofen according to the package instructions as needed for pain.  Do not wear your brace for the next two days so that the wound can get some air and dry out.  Keep your right leg elevated as much as possible to decrease swelling, improve blood flow and aid in healing.  Follow-up with your Orthotics and prostentics provider to evaluate your brace to see if it is too tight.  Wear the post-op shoe as needed for ambulation  until your wound heals and your brace is adjusted.   Return for new or worsening symptoms.       ED Prescriptions     Medication Sig Dispense Auth. Provider   doxycycline (VIBRAMYCIN) 100 MG capsule Take 1 capsule (100 mg total) by mouth 2 (two) times daily. 20 capsule Margarette Canada, NP      PDMP not reviewed this encounter.   Margarette Canada, NP 01/07/22 [phone]

## 2022-03-01 ENCOUNTER — Other Ambulatory Visit: Payer: Self-pay

## 2022-03-01 ENCOUNTER — Encounter: Payer: Medicaid Other | Attending: Internal Medicine | Admitting: Internal Medicine

## 2022-03-01 DIAGNOSIS — Q059 Spina bifida, unspecified: Secondary | ICD-10-CM | POA: Diagnosis not present

## 2022-03-01 DIAGNOSIS — L97812 Non-pressure chronic ulcer of other part of right lower leg with fat layer exposed: Secondary | ICD-10-CM | POA: Diagnosis not present

## 2022-03-01 DIAGNOSIS — G809 Cerebral palsy, unspecified: Secondary | ICD-10-CM | POA: Insufficient documentation

## 2022-03-01 DIAGNOSIS — I87311 Chronic venous hypertension (idiopathic) with ulcer of right lower extremity: Secondary | ICD-10-CM | POA: Diagnosis present

## 2022-03-01 DIAGNOSIS — G7000 Myasthenia gravis without (acute) exacerbation: Secondary | ICD-10-CM | POA: Insufficient documentation

## 2022-03-01 DIAGNOSIS — G808 Other cerebral palsy: Secondary | ICD-10-CM

## 2022-03-01 NOTE — Progress Notes (Signed)
[name] ([id number]) Visit Report for 03/01/2022 Chief Complaint Document Details Patient Name: [name], [name] Date of Service: 03/01/2022 10:00 AM Medical Record Number: [id number] Patient Account Number: [id number] Date of Birth/Sex: [birth year] (36 y.o. F) Treating RN: Donnamarie Poag Primary Care Provider: Maryruth Hancock Other Clinician: Referring Provider: Samara Deist Treating Provider/Extender: Yaakov Guthrie in Treatment: 0 Information Obtained from: Patient Chief Complaint Right lower extremity wound Electronic Signature(s) Signed: 03/01/2022 1:12:13 PM By: Kalman Shan DO Entered By: Kalman Shan on 03/01/2022 12:40:23 [name], [name] ([id number]) -------------------------------------------------------------------------------- Debridement Details Patient Name: [name] Date of Service: 03/01/2022 10:00 AM Medical Record Number: [id number] Patient Account Number: [id number] Date of Birth/Sex: 18-Mar-1986 (36 y.o. F) Treating RN: Donnamarie Poag Primary Care Provider: Maryruth Hancock Other Clinician: Referring Provider: Samara Deist Treating Provider/Extender: Yaakov Guthrie in Treatment: 0 Debridement Performed for Wound #1 Right,Lateral Lower Leg Assessment: Performed By: Physician Kalman Shan, MD Debridement Type: Debridement Level of Consciousness (Pre- Awake and Alert procedure): Pre-procedure Verification/Time Out Yes - 11:33 Taken: Start Time: 11:35 Pain Control: Lidocaine Total Area Debrided (L x W): 1 (cm) x 1.3 (cm) = 1.3 (cm) Tissue and other material Viable, Non-Viable, Slough, Subcutaneous, Slough debrided: Level: Skin/Subcutaneous Tissue Debridement Description: Excisional Instrument: Curette Bleeding: Minimum Hemostasis Achieved: Pressure Response to Treatment: Procedure was tolerated well Level of Consciousness (Post- Awake and Alert procedure): Post Debridement Measurements of Total Wound Length: (cm) 1 Stage:  Category/Stage III Width: (cm) 1.3 Depth: (cm) 0.7 Volume: (cm) 0.715 Character of Wound/Ulcer Post Debridement: Improved Post Procedure Diagnosis Same as Pre-procedure Electronic Signature(s) Signed: 03/01/2022 12:55:43 PM By: Donnamarie Poag Signed: 03/01/2022 1:12:13 PM By: Kalman Shan DO Entered By: Donnamarie Poag on 03/01/2022 11:39:07 [name] ([id number]) -------------------------------------------------------------------------------- HPI Details Patient Name: [name] Date of Service: 03/01/2022 10:00 AM Medical Record Number: [id number] Patient Account Number: [id number] Date of Birth/Sex: December 29, 1985 (36 y.o. F) Treating RN: Donnamarie Poag Primary Care Provider: Maryruth Hancock Other Clinician: Referring Provider: Samara Deist Treating Provider/Extender: Yaakov Guthrie in Treatment: 0 History of Present Illness HPI Description: Admission 03/01/2022 [name] is a 36 year old female with a past medical history of cerebral palsy with leg braces, spina bifida and myasthenia gravis that presents to the clinic for a 69-month history of nonhealing ulcer to the right lower extremity. She states that the brace was rubbing up against her leg causing a blister and subsequently developed a wound. She has stopped wearing the brace since her wound developed. She originally saw urgent care on 01/07/2022 for this issue. They prescribed doxycycline at that time. She has been keeping the wound open to air. She covers it when she goes outside. She recently saw her podiatrist, Dr. Vickki Muff on 02/16/2022 and he referred her to our clinic. He also prescribed her doxycycline. She currently denies signs of infection. Electronic Signature(s) Signed: 03/01/2022 1:12:13 PM By: Kalman Shan DO Entered By: Kalman Shan on 03/01/2022 12:51:32 [name], [name] ([id number]) -------------------------------------------------------------------------------- Physical Exam Details Patient  Name: [name] Date of Service: 03/01/2022 10:00 AM Medical Record Number: [id number] Patient Account Number: [id number] Date of Birth/Sex: Feb 03, 1986 (36 y.o. F) Treating RN: Donnamarie Poag Primary Care Provider: Maryruth Hancock Other Clinician: Referring Provider: Samara Deist Treating Provider/Extender: Yaakov Guthrie in Treatment: 0 Constitutional . Cardiovascular . Psychiatric . Notes Right lower extremity: Open wound with nonviable tissue throughout. Postdebridement mostly granulation tissue present. No surrounding signs of infection. No drainage noted. Nonpitting edema to the knee. Electronic Signature(s) Signed: 03/01/2022 1:12:13 PM By: Kalman Shan DO Entered By: Kalman Shan  on 03/01/2022 12:52:13 [name], [name] ([id number]) -------------------------------------------------------------------------------- Physician Orders Details Patient Name: [name] Date of Service: 03/01/2022 10:00 AM Medical Record Number: [id number] Patient Account Number: [id number] Date of Birth/Sex: March 03, 1986 (36 y.o. F) Treating RN: Donnamarie Poag Primary Care Provider: Maryruth Hancock Other Clinician: Referring Provider: Samara Deist Treating Provider/Extender: Yaakov Guthrie in Treatment: 0 Verbal / Phone Orders: No Diagnosis Coding Follow-up Appointments o Return Appointment in 1 week. Bathing/ Shower/ Hygiene o Clean wound with Normal Saline or wound cleanser. - keep dressing dry or change after shower o May shower with wound dressing protected with water repellent cover or cast protector. o No tub bath. Anesthetic (Use 'Patient Medications' Section for Anesthetic Order Entry) o Lidocaine applied to wound bed Edema Control - Lymphedema / Segmental Compressive Device / Other o Tubigrip single layer applied. - size D right leg o Elevate legs to the level of the heart and pump ankles as often as possible Additional Orders / Instructions o Follow  Nutritious Diet and Increase Protein Intake Wound Treatment Wound #1 - Lower Leg Wound Laterality: Right, Lateral Cleanser: Wound Cleanser 1 x Per Day/15 Days Discharge Instructions: Wash your hands with soap and water. Remove old dressing, discard into plastic bag and place into trash. Cleanse the wound with Wound Cleanser prior to applying a clean dressing using gauze sponges, not tissues or cotton balls. Do not scrub or use excessive force. Pat dry using gauze sponges, not tissue or cotton balls. Topical: Gentamicin 1 x Per Day/15 Days Discharge Instructions: In office only-Apply as directed by provider. Primary Dressing: Hydrofera Blue Ready Transfer Foam, 2.5x2.5 (in/in) 1 x Per Day/15 Days Discharge Instructions: Apply Hydrofera Blue Ready to wound bed as directed Secondary Dressing: [place] Dressing, 4x4 (in/in) 1 x Per Day/15 Days Discharge Instructions: Apply over dressing to secure in place. Electronic Signature(s) Signed: 03/01/2022 12:55:43 PM By: Donnamarie Poag Signed: 03/01/2022 1:12:13 PM By: Kalman Shan DO Entered By: Donnamarie Poag on 03/01/2022 11:44:00 Bergen, [name] ([id number]) -------------------------------------------------------------------------------- Problem List Details Patient Name: [name] Date of Service: 03/01/2022 10:00 AM Medical Record Number: [id number] Patient Account Number: [id number] Date of Birth/Sex: 1986/06/15 (36 y.o. 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 F) Treating RN: Donnamarie Poag Primary Care Provider: Maryruth Hancock Other Clinician: Referring Provider: Samara Deist Treating Provider/Extender: Yaakov Guthrie in Treatment: 0 Subjective Chief Complaint Information obtained from Patient Right lower extremity wound History of Present Illness (HPI) Admission 03/01/2022 Ms. [name] is a 36 year old female with a past medical history of cerebral palsy with leg braces, spina bifida and myasthenia gravis that presents to the clinic for a 74-month history of nonhealing ulcer to the right lower extremity. She states that the brace was rubbing up against her leg causing a blister and subsequently developed a wound. She has stopped wearing the brace since her wound developed. She originally saw urgent care on 01/07/2022 for  this issue. They prescribed doxycycline at that time. She has been keeping the wound open to air. She covers it when she goes outside. She recently saw her podiatrist, Dr. Ether Griffins on 02/16/2022 and he referred her to our clinic. He also prescribed her doxycycline. She currently denies signs of infection. Patient History Information obtained from Patient. Allergies penicillin (Severity: Severe), iodine (Severity: Severe), latex (Severity: Moderate) Social History Never smoker, Marital Status - Married, Alcohol Use - Never, Drug Use - No History. Medical History Cardiovascular Patient has history of Hypertension Endocrine Patient has history of Type II Diabetes - has DX but states "not anymore" last A1C was 6.5 Musculoskeletal Patient has history of  Osteoarthritis Neurologic Patient has history of Neuropathy, Paraplegia - partial with spina bifida Blood sugar is not tested. Review of Systems (ROS) Constitutional Symptoms (General Health) Denies complaints or symptoms of Fatigue, Fever, Chills, Marked Weight Change. Eyes Complains or has symptoms of Vision Changes, Glasses / Contacts, myasthenia gravis Ear/Nose/Mouth/Throat Denies complaints or symptoms of Difficult clearing ears, Sinusitis. Hematologic/Lymphatic Denies complaints or symptoms of Bleeding / Clotting Disorders, Human Immunodeficiency Virus. Respiratory Denies complaints or symptoms of Chronic or frequent coughs, Shortness of Breath. Cardiovascular Complains or has symptoms of LE edema. Gastrointestinal Denies complaints or symptoms of Frequent diarrhea, Nausea, Vomiting. Genitourinary does routine self cath and frequent UTI Immunological myasthenia gravis Integumentary (Skin) Complains or has symptoms of Wounds - hx wound plantar right foot. Neurologic Complains or has symptoms of Numbness/parasthesias, Focal/Weakness. Oncologic takes meds in that category for MG Psychiatric Denies complaints or symptoms of Anxiety, Claustrophobia. Vanduyne, Keelee ([id number]) Objective Constitutional Vitals Time Taken: 10:45 AM, Height: 61 in, Source: Stated, Weight: 200 lbs, Source: Measured, BMI: 37.8, Temperature: 98.7 F, Pulse: 90 bpm, Respiratory Rate: 16 breaths/min, Blood Pressure: 166/112 mmHg. General Notes: stated her BP runs high a lot and takes meds and follows with PCP and at home General Notes: Right lower extremity: Open wound with nonviable tissue throughout. Postdebridement mostly granulation tissue present. No surrounding signs of infection. No drainage noted. Nonpitting edema to the knee. Integumentary (Hair, Skin) Wound #1 status is Open. Original cause of wound was Bump. The date acquired was: 01/07/2022. The wound is located on the  Right,Lateral Lower Leg. The wound measures 1cm length x 1.3cm width x 0.5cm depth; 1.021cm^2 area and 0.511cm^3 volume. There is Fat Layer (Subcutaneous Tissue) exposed. There is no tunneling or undermining noted. There is a large amount of serosanguineous drainage noted. There is small (1-33%) red, pink granulation within the wound bed. There is a large (67-100%) amount of necrotic tissue within the wound bed including Adherent Slough. Assessment Active Problems ICD-10 Non-pressure chronic ulcer of other part of right lower leg with fat layer exposed Other cerebral palsy Patient presents with a 69-month history of nonhealing ulcer to the right lower extremity caused by her leg brace. She has refrained from using the brace since she developed this wound. I debrided nonviable tissue. No signs of surrounding infection. She reports completing her course of antibiotics yesterday. I do not think we need to extend antibiotics at this time. ABIs were 1.16. I recommended Hydrofera Blue dressing daily. She would likely benefit from compression therapy in the near future. Follow-up in 1 week. 48 minutes was spent on the encounter including face-to-face, EMR review and coordination of care Procedures Wound #1 Pre-procedure diagnosis of Wound #1 is a Pressure Ulcer located on the Right,Lateral Lower Leg . There was a Excisional Skin/Subcutaneous Tissue Debridement with a total area of  1.3 sq cm performed by Kalman Shan, MD. With the following instrument(s): Curette to remove Viable and Non-Viable tissue/material. Material removed includes Subcutaneous Tissue and Slough and after achieving pain control using Lidocaine. A time out was conducted at 11:33, prior to the start of the procedure. A Minimum amount of bleeding was controlled with Pressure. The procedure was tolerated well. Post Debridement Measurements: 1cm length x 1.3cm width x 0.7cm depth; 0.715cm^3 volume. Post debridement Stage noted as  Category/Stage III. Character of Wound/Ulcer Post Debridement is improved. Post procedure Diagnosis Wound #1: Same as Pre-Procedure Plan Follow-up Appointments: Return Appointment in 1 week. Bathing/ Shower/ Hygiene: LACHRISHA, SLEIGH ([id number]) Clean wound with Normal Saline or wound cleanser. - keep dressing dry or change after shower May shower with wound dressing protected with water repellent cover or cast protector. No tub bath. Anesthetic (Use 'Patient Medications' Section for Anesthetic Order Entry): Lidocaine applied to wound bed Edema Control - Lymphedema / Segmental Compressive Device / Other: Tubigrip single layer applied. - size D right leg Elevate legs to the level of the heart and pump ankles as often as possible Additional Orders / Instructions: Follow Nutritious Diet and Increase Protein Intake WOUND #1: - Lower Leg Wound Laterality: Right, Lateral Cleanser: Wound Cleanser 1 x Per Day/15 Days Discharge Instructions: Wash your hands with soap and water. Remove old dressing, discard into plastic bag and place into trash. Cleanse the wound with Wound Cleanser prior to applying a clean dressing using gauze sponges, not tissues or cotton balls. Do not scrub or use excessive force. Pat dry using gauze sponges, not tissue or cotton balls. Topical: Gentamicin 1 x Per Day/15 Days Discharge Instructions: In office only-Apply as directed by provider. Primary Dressing: Hydrofera Blue Ready Transfer Foam, 2.5x2.5 (in/in) 1 x Per Day/15 Days Discharge Instructions: Apply Hydrofera Blue Ready to wound bed as directed Secondary Dressing: [place] Dressing, 4x4 (in/in) 1 x Per Day/15 Days Discharge Instructions: Apply over dressing to secure in place. 1. In office sharp debridement 2. Hydrofera Blue 3. Follow-up in 1 week Electronic Signature(s) Signed: 03/01/2022 1:12:13 PM By: Kalman Shan DO Entered By: Kalman Shan on 03/01/2022 12:55:47 Salvia, Laurette  ([id number]) -------------------------------------------------------------------------------- ROS/PFSH Details Patient Name: [name] Date of Service: 03/01/2022 10:00 AM Medical Record Number: [id number] Patient Account Number: [id number] Date of Birth/Sex: September 21, 1986 (36 y.o. F) Treating RN: Donnamarie Poag Primary Care Provider: Maryruth Hancock Other Clinician: Referring Provider: Samara Deist Treating Provider/Extender: Yaakov Guthrie in Treatment: 0 Information Obtained From Patient Constitutional Symptoms (General Health) Complaints and Symptoms: Negative for: Fatigue; Fever; Chills; Marked Weight Change Eyes Complaints and Symptoms: Positive for: Vision Changes; Glasses / Contacts Review of System Notes: myasthenia gravis Ear/Nose/Mouth/Throat Complaints and Symptoms: Negative for: Difficult clearing ears; Sinusitis Hematologic/Lymphatic Complaints and Symptoms: Negative for: Bleeding / Clotting Disorders; Human Immunodeficiency Virus Respiratory Complaints and Symptoms: Negative for: Chronic or frequent coughs; Shortness of Breath Cardiovascular Complaints and Symptoms: Positive for: LE edema Medical History: Positive for: Hypertension Gastrointestinal Complaints and Symptoms: Negative for: Frequent diarrhea; Nausea; Vomiting Integumentary (Skin) Complaints and Symptoms: Positive for: Wounds - hx wound plantar right foot Neurologic Complaints and Symptoms: Positive for: Numbness/parasthesias; Focal/Weakness Medical History: Positive for: Neuropathy; Paraplegia - partial with spina bifida Psychiatric Mccollom, Zakari ([id number]) Complaints and Symptoms: Negative for: Anxiety; Claustrophobia Endocrine Medical History: Positive for: Type II Diabetes - has DX but states "not anymore" last A1C was 6.5 Blood sugar tested every day: No Genitourinary Complaints and Symptoms: Review of System Notes: does routine self cath  and frequent  UTI Immunological Complaints and Symptoms: Review of System Notes: myasthenia gravis Musculoskeletal Medical History: Positive for: Osteoarthritis Oncologic Complaints and Symptoms: Review of System Notes: takes meds in that category for MG Immunizations Pneumococcal Vaccine: Received Pneumococcal Vaccination: Yes Received Pneumococcal Vaccination On or After 60th Birthday: No Implantable Devices None Family and Social History Never smoker; Marital Status - Married; Alcohol Use: Never; Drug Use: No History Electronic Signature(s) Signed: 03/01/2022 12:55:43 PM By: Donnamarie Poag Signed: 03/01/2022 1:12:13 PM By: Kalman Shan DO Entered By: Donnamarie Poag on 03/01/2022 10:59:56 Beagley, Samyiah ([id number]) -------------------------------------------------------------------------------- SuperBill Details Patient Name: [name] Date of Service: 03/01/2022 Medical Record Number: [id number] Patient Account Number: [id number] Date of Birth/Sex: [birth year] (36 y.o. F) Treating RN: Donnamarie Poag Primary Care Provider: Maryruth Hancock Other Clinician: Referring Provider: Samara Deist Treating Provider/Extender: Yaakov Guthrie in Treatment: 0 Diagnosis Coding ICD-10 Codes Code Description [id number] Non-pressure chronic ulcer of other part of right lower leg with fat layer exposed G80.8 Other cerebral palsy Facility Procedures CPT4 Code: [id number] Description: [id number] - WOUND CARE VISIT-LEV 3 EST PT Modifier: Quantity: 1 CPT4 Code: [id number] Description: [id number] - DEB SUBQ TISSUE 20 SQ CM/< Modifier: Quantity: 1 CPT4 Code: Description: ICD-10 Diagnosis Description [id number] Non-pressure chronic ulcer of other part of right lower leg with fat laye Modifier: r exposed Quantity: Physician Procedures CPT4 Code: [id number] Description: [id number] - WC PHYS LEVEL 4 - NEW PT Modifier: Quantity: 1 CPT4 Code: Description: ICD-10 Diagnosis Description [id number] Non-pressure chronic ulcer of other  part of right lower leg with fat lay G80.8 Other cerebral palsy Modifier: er exposed Quantity: CPT4 Code: [id number] Description: [id number] - WC PHYS SUBQ TISS 20 SQ CM Modifier: Quantity: 1 CPT4 Code: Description: ICD-10 Diagnosis Description [id number] Non-pressure chronic ulcer of other part of right lower leg with fat lay Modifier: er exposed Quantity: Electronic Signature(s) Signed: 03/01/2022 1:12:13 PM By: Kalman Shan DO Entered By: Kalman Shan on 03/01/2022 12:56:02

## 2022-03-01 NOTE — Progress Notes (Signed)
EARLEAN, GLASFORD ([id number]) Visit Report for 03/01/2022 Allergy List Details Patient Name: [name], [name] Date of Service: 03/01/2022 10:00 AM Medical Record Number: [id number] Patient Account Number: [id number] Date of Birth/Sex: [birth year] (36 y.o. F) Treating RN: Donnamarie Poag Primary Care Manolo Bosket: Maryruth Hancock Other Clinician: Referring Arnesia Vincelette: Samara Deist Treating Jamarius Saha/Extender: Yaakov Guthrie in Treatment: 0 Allergies Active Allergies penicillin Severity: Severe iodine Severity: Severe latex Severity: Moderate Type: Allergen Allergy Notes Electronic Signature(s) Signed: 03/01/2022 12:55:43 PM By: Donnamarie Poag Entered By: Donnamarie Poag on 03/01/2022 10:52:21 Whack, Dan Europe ([id number]) -------------------------------------------------------------------------------- Arrival Information Details Patient Name: [name] Date of Service: 03/01/2022 10:00 AM Medical Record Number: [id number] Patient Account Number: [id number] Date of Birth/Sex: [birth year] (36 y.o. F) Treating RN: Donnamarie Poag Primary Care Jeaninne Lodico: Maryruth Hancock Other Clinician: Referring Trigg Delarocha: Samara Deist Treating Reyaan Thoma/Extender: Yaakov Guthrie in Treatment: 0 Visit Information Patient Arrived: Cane Arrival Time: 10:47 Accompanied By: self Transfer Assistance: None Patient Identification Verified: Yes Secondary Verification Process Completed: Yes Patient Requires Transmission-Based Precautions: No Patient Has Alerts: Yes Electronic Signature(s) Signed: 03/01/2022 12:55:43 PM By: Donnamarie Poag Entered By: Donnamarie Poag on 03/01/2022 10:50:45 Waugh, Dan Europe ([id number]) -------------------------------------------------------------------------------- Clinic Level of Care Assessment Details Patient Name: [name] Date of Service: 03/01/2022 10:00 AM Medical Record Number: [id number] Patient Account Number: [id number] Date of Birth/Sex: January 06, 1986 (36 y.o. F) Treating RN:  Donnamarie Poag Primary Care Kalix Meinecke: Maryruth Hancock Other Clinician: Referring Camerin Jimenez: Samara Deist Treating Margaretha Mahan/Extender: Yaakov Guthrie in Treatment: 0 Clinic Level of Care Assessment Items TOOL 1 Quantity Score []  - Use when EandM and Procedure is performed on INITIAL visit 0 ASSESSMENTS - Nursing Assessment / Reassessment X - General Physical Exam (combine w/ comprehensive assessment (listed just below) when performed on new 1 20 pt. evals) X- 1 25 Comprehensive Assessment (HX, ROS, Risk Assessments, Wounds Hx, etc.) ASSESSMENTS - Wound and Skin Assessment / Reassessment []  - Dermatologic / Skin Assessment (not related to wound area) 0 ASSESSMENTS - Ostomy and/or Continence Assessment and Care []  - Incontinence Assessment and Management 0 []  - 0 Ostomy Care Assessment and Management (repouching, etc.) PROCESS - Coordination of Care X - Simple Patient / Family Education for ongoing care 1 15 []  - 0 Complex (extensive) Patient / Family Education for ongoing care []  - 0 Staff obtains Programmer, systems, Records, Test Results / Process Orders []  - 0 Staff telephones HHA, Nursing Homes / Clarify orders / etc []  - 0 Routine Transfer to another Facility (non-emergent condition) []  - 0 Routine Hospital Admission (non-emergent condition) X- 1 15 New Admissions / Biomedical engineer / Ordering NPWT, Apligraf, etc. []  - 0 Emergency Hospital Admission (emergent condition) PROCESS - Special Needs []  - Pediatric / Minor Patient Management 0 []  - 0 Isolation Patient Management []  - 0 Hearing / Language / Visual special needs []  - 0 Assessment of Community assistance (transportation, D/C planning, etc.) []  - 0 Additional assistance / Altered mentation []  - 0 Support Surface(s) Assessment (bed, cushion, seat, etc.) INTERVENTIONS - Miscellaneous []  - External ear exam 0 []  - 0 Patient Transfer (multiple staff / Civil Service fast streamer / Similar devices) []  - 0 Simple Staple / Suture  removal (25 or less) []  - 0 Complex Staple / Suture removal (26 or more) []  - 0 Hypo/Hyperglycemic Management (do not check if billed separately) X- 1 15 Ankle / Brachial Index (ABI) - do not check if billed separately Has the patient been seen at the hospital within the last three years: Yes Total Score: 90 Level Of Care: New/Established -  Level 3 Fallert, See ([id number]) Electronic Signature(s) Signed: 03/01/2022 12:55:43 PM By: Donnamarie Poag Entered By: Donnamarie Poag on 03/01/2022 11:44:30 Wisecup, Dan Europe ([id number]) -------------------------------------------------------------------------------- Encounter Discharge Information Details Patient Name: [name] Date of Service: 03/01/2022 10:00 AM Medical Record Number: [id number] Patient Account Number: [id number] Date of Birth/Sex: 1986-12-21 (36 y.o. F) Treating RN: Donnamarie Poag Primary Care Trini Soldo: Maryruth Hancock Other Clinician: Referring Devrin Monforte: Samara Deist Treating Devanshi Califf/Extender: Yaakov Guthrie in Treatment: 0 Encounter Discharge Information Items Post Procedure Vitals Discharge Condition: Stable Temperature (F): 97.7 Ambulatory Status: Cane Pulse (bpm): 88 Discharge Destination: Home Respiratory Rate (breaths/min): 16 Transportation: Private Auto Blood Pressure (mmHg): 167/100 Accompanied By: self Schedule Follow-up Appointment: No Clinical Summary of Care: Electronic Signature(s) Signed: 03/01/2022 12:55:43 PM By: Donnamarie Poag Entered By: Donnamarie Poag on 03/01/2022 12:01:08 Alford, Dan Europe ([id number]) -------------------------------------------------------------------------------- Lower Extremity Assessment Details Patient Name: [name] Date of Service: 03/01/2022 10:00 AM Medical Record Number: [id number] Patient Account Number: [id number] Date of Birth/Sex: 11/29/1986 (36 y.o. F) Treating RN: Donnamarie Poag Primary Care Hadriel Northup: Maryruth Hancock Other Clinician: Referring Alashia Brownfield: Samara Deist Treating Retina Bernardy/Extender: Yaakov Guthrie in Treatment: 0 Edema Assessment Assessed: [Left: No] [Right: Yes] Edema: [Left: Ye] [Right: s] Calf Left: Right: Point of Measurement: 35 cm From Medial Instep 38.5 cm Ankle Left: Right: Point of Measurement: 11 cm From Medial Instep 29 cm Knee To Floor Left: Right: From Medial Instep 43 cm Vascular Assessment Blood Pressure: Brachial: [Right:128] Ankle: [Right:Dorsalis Pedis: 148 1.16] Electronic Signature(s) Signed: 03/01/2022 12:55:43 PM By: Donnamarie Poag Entered By: Donnamarie Poag on 03/01/2022 11:14:42 Coggin, Alisson ([id number]) -------------------------------------------------------------------------------- Multi Wound Chart Details Patient Name: [name] Date of Service: 03/01/2022 10:00 AM Medical Record Number: [id number] Patient Account Number: [id number] Date of Birth/Sex: 1986-07-08 (36 y.o. F) Treating RN: Donnamarie Poag Primary Care Romani Wilbon: Maryruth Hancock Other Clinician: Referring Jalen Oberry: Samara Deist Treating Nathanal Hermiz/Extender: Yaakov Guthrie in Treatment: 0 Vital Signs Height(in): 61 Pulse(bpm): 44 Weight(lbs): 200 Blood Pressure(mmHg): 166/112 Body Mass Index(BMI): 37.8 Temperature(F): 98.7 Respiratory Rate(breaths/min): 16 Photos: [N/A:N/A] Wound Location: Right, Lateral Lower Leg N/A N/A Wounding Event: Bump N/A N/A Primary Etiology: Pressure Ulcer N/A N/A Secondary Etiology: Infection - not elsewhere classified N/A N/A Comorbid History: Hypertension, Type II Diabetes, N/A N/A Osteoarthritis, Neuropathy, Paraplegia Date Acquired: 01/07/2022 N/A N/A Weeks of Treatment: 0 N/A N/A Wound Status: Open N/A N/A Wound Recurrence: No N/A N/A Measurements L x W x D (cm) 1x1.3x0.5 N/A N/A Area (cm) : 1.021 N/A N/A Volume (cm) : 0.511 N/A N/A % Reduction in Area: 0.00% N/A N/A % Reduction in Volume: 0.00% N/A N/A Classification: Category/Stage III N/A N/A Exudate Amount: Large N/A  N/A Exudate Type: Serosanguineous N/A N/A Exudate Color: red, brown N/A N/A Granulation Amount: Small (1-33%) N/A N/A Granulation Quality: Red, Pink N/A N/A Necrotic Amount: Large (67-100%) N/A N/A Exposed Structures: Fat Layer (Subcutaneous Tissue): N/A N/A Yes Fascia: No Tendon: No Muscle: No Joint: No Bone: No Debridement: Debridement - Excisional N/A N/A Pre-procedure Verification/Time 11:33 N/A N/A Out Taken: Pain Control: Lidocaine N/A N/A Tissue Debrided: Subcutaneous, Slough N/A N/A Level: Skin/Subcutaneous Tissue N/A N/A Debridement Area (sq cm): 1.3 N/A N/A Instrument: Curette N/A N/A Bleeding: Minimum N/A N/A Hemostasis Achieved: Pressure N/A N/A Debridement Treatment Procedure was tolerated well N/A N/A Response: Kargbo, Rexanna ([id number]) Post Debridement 1x1.3x0.7 N/A N/A Measurements L x W x D (cm) Post Debridement Volume: 0.715 N/A N/A (cm) Post Debridement Stage: Category/Stage III N/A N/A Procedures Performed: Debridement N/A N/A Treatment Notes Wound #1 (Lower Leg)  Wound Laterality: Right, Lateral Cleanser Wound Cleanser Discharge Instruction: Wash your hands with soap and water. Remove old dressing, discard into plastic bag and place into trash. Cleanse the wound with Wound Cleanser prior to applying a clean dressing using gauze sponges, not tissues or cotton balls. Do not scrub or use excessive force. Pat dry using gauze sponges, not tissue or cotton balls. Peri-Wound Care Topical Gentamicin Discharge Instruction: In office only-Apply as directed by Macoy Rodwell. Primary Dressing Hydrofera Blue Ready Transfer Foam, 2.5x2.5 (in/in) Discharge Instruction: Apply Hydrofera Blue Ready to wound bed as directed Secondary Dressing [place] Dressing, 4x4 (in/in) Discharge Instruction: Apply over dressing to secure in place. Secured With Compression Wrap Compression Stockings Add-Ons Electronic Signature(s) Signed: 03/01/2022 1:12:13 PM By:  Kalman Shan DO Previous Signature: 03/01/2022 11:24:06 AM Version By: Donnamarie Poag Entered By: Kalman Shan on 03/01/2022 12:40:09 Germond, Dan Europe ([id number]) -------------------------------------------------------------------------------- Multi-Disciplinary Care Plan Details Patient Name: [name] Date of Service: 03/01/2022 10:00 AM Medical Record Number: [id number] Patient Account Number: [id number] Date of Birth/Sex: Sep 26, 1986 (36 y.o. F) Treating RN: Donnamarie Poag Primary Care Alphonzo Devera: Maryruth Hancock Other Clinician: Referring Patrese Neal: Samara Deist Treating Stephene Alegria/Extender: Yaakov Guthrie in Treatment: 0 Active Inactive Orientation to the Wound Care Program Nursing Diagnoses: Knowledge deficit related to the wound healing center program Goals: Patient/caregiver will verbalize understanding of the [facility] Date Initiated: 03/01/2022 Target Resolution Date: 03/17/2022 Goal Status: Active Interventions: Provide education on orientation to the wound center Notes: Wound/Skin Impairment Nursing Diagnoses: Impaired tissue integrity Knowledge deficit related to smoking impact on wound healing Knowledge deficit related to ulceration/compromised skin integrity Goals: Patient/caregiver will verbalize understanding of skin care regimen Date Initiated: 03/01/2022 Target Resolution Date: 03/17/2022 Goal Status: Active Ulcer/skin breakdown will have a volume reduction of 30% by week 4 Date Initiated: 03/01/2022 Target Resolution Date: 03/29/2022 Goal Status: Active Ulcer/skin breakdown will have a volume reduction of 50% by week 8 Date Initiated: 03/01/2022 Target Resolution Date: 04/26/2022 Goal Status: Active Ulcer/skin breakdown will have a volume reduction of 80% by week 12 Date Initiated: 03/01/2022 Target Resolution Date: 05/24/2022 Goal Status: Active Ulcer/skin breakdown will heal within 14 weeks Date Initiated: 03/01/2022 Target Resolution Date:  06/07/2022 Goal Status: Active Interventions: Assess patient/caregiver ability to obtain necessary supplies Assess patient/caregiver ability to perform ulcer/skin care regimen upon admission and as needed Assess ulceration(s) every visit Notes: Electronic Signature(s) Signed: 03/01/2022 11:23:45 AM By: Donnamarie Poag Entered By: Donnamarie Poag on 03/01/2022 11:23:45 [place], Dan Europe ([id number]) -------------------------------------------------------------------------------- Pain Assessment Details Patient Name: [name] Date of Service: 03/01/2022 10:00 AM Medical Record Number: [id number] Patient Account Number: [id number] Date of Birth/Sex: 04-20-1986 (36 y.o. F) Treating RN: Donnamarie Poag Primary Care Savahna Casados: Maryruth Hancock Other Clinician: Referring Airyn Ellzey: Samara Deist Treating Anneliese Leblond/Extender: Yaakov Guthrie in Treatment: 0 Active Problems Location of Pain Severity and Description of Pain Patient Has Paino No Site Locations Rate the pain. Current Pain Level: 0 Pain Management and Medication Current Pain Management: Electronic Signature(s) Signed: 03/01/2022 12:55:43 PM By: Donnamarie Poag Entered By: Donnamarie Poag on 03/01/2022 10:50:52 G[place]on, Dan Europe ([id number]) -------------------------------------------------------------------------------- Patient/Caregiver Education Details Patient Name: [name] Date of Service: 03/01/2022 10:00 AM Medical Record Number: [id number] Patient Account Number: [id number] Date of Birth/Gender: 1986/11/05 (36 y.o. F) Treating RN: Donnamarie Poag Primary Care Physician: Maryruth Hancock Other Clinician: Referring Physician: Samara Deist Treating Physician/Extender: Yaakov Guthrie in Treatment: 0 Education Assessment Education Provided To: Patient Education Topics Provided Basic Hygiene: Elevated Blood Sugar/ Impact on Healing: Infection: Nutrition: Offloading: Pain: Welcome  To [facility]: Wound  Debridement: Wound/Skin Impairment: Electronic Signature(s) Signed: 03/01/2022 12:55:43 PM By: Donnamarie Poag Entered By: Donnamarie Poag on 03/01/2022 11:39:37 Heagle, Berenize ([id number]) -------------------------------------------------------------------------------- Wound Assessment Details Patient Name: [name] Date of Service: 03/01/2022 10:00 AM Medical Record Number: [id number] Patient Account Number: [id number] Date of Birth/Sex: [birth year] (36 y.o. F) Treating RN: Donnamarie Poag Primary Care Cambelle Suchecki: Maryruth Hancock Other Clinician: Referring Brynlee Pennywell: Samara Deist Treating Kendre Sires/Extender: Yaakov Guthrie in Treatment: 0 Wound Status Wound Number: 1 Primary Pressure Ulcer Etiology: Wound Location: Right, Lateral Lower Leg Secondary Infection - not elsewhere classified Wounding Event: Bump Etiology: Date Acquired: 01/07/2022 Wound Status: Open Weeks Of Treatment: 0 Notes: stated she thought she was bit by a spider in wound Clustered Wound: No area around the time of wound Comorbid Hypertension, Type II Diabetes, Osteoarthritis, History: Neuropathy, Paraplegia Photos Wound Measurements Length: (cm) 1 Width: (cm) 1.3 Depth: (cm) 0.5 Area: (cm) 1.021 Volume: (cm) 0.511 % Reduction in Area: 0% % Reduction in Volume: 0% Tunneling: No Undermining: No Wound Description Classification: Category/Stage III Exudate Amount: Large Exudate Type: Serosanguineous Exudate Color: red, brown Foul Odor After Cleansing: No Slough/Fibrino Yes Wound Bed Granulation Amount: Small (1-33%) Exposed Structure Granulation Quality: Red, Pink Fascia Exposed: No Necrotic Amount: Large (67-100%) Fat Layer (Subcutaneous Tissue) Exposed: Yes Necrotic Quality: Adherent Slough Tendon Exposed: No Muscle Exposed: No Joint Exposed: No Bone Exposed: No Treatment Notes Wound #1 (Lower Leg) Wound Laterality: Right, Lateral Cleanser Wound Cleanser Discharge Instruction: Wash your  hands with soap and water. Remove old dressing, discard into plastic bag and place into trash. Cleanse the wound with Wound Cleanser prior to applying a clean dressing using gauze sponges, not tissues or cotton balls. Do not Rosell, Annakate ([id number]) scrub or use excessive force. Pat dry using gauze sponges, not tissue or cotton balls. Peri-Wound Care Topical Gentamicin Discharge Instruction: In office only-Apply as directed by Devontre Siedschlag. Primary Dressing Hydrofera Blue Ready Transfer Foam, 2.5x2.5 (in/in) Discharge Instruction: Apply Hydrofera Blue Ready to wound bed as directed Secondary Dressing [place] Dressing, 4x4 (in/in) Discharge Instruction: Apply over dressing to secure in place. Secured With Compression Wrap Compression Stockings Environmental education officer) Signed: 03/01/2022 12:55:43 PM By: Donnamarie Poag Entered By: Donnamarie Poag on 03/01/2022 11:33:57 Gazda, Apoorva ([id number]) -------------------------------------------------------------------------------- Vitals Details Patient Name: [name] Date of Service: 03/01/2022 10:00 AM Medical Record Number: [id number] Patient Account Number: [id number] Date of Birth/Sex: 1986-06-20 (36 y.o. F) Treating RN: Donnamarie Poag Primary Care Jerel Sardina: Maryruth Hancock Other Clinician: Referring Preslie Depasquale: Samara Deist Treating Addelyn Alleman/Extender: Yaakov Guthrie in Treatment: 0 Vital Signs Time Taken: 10:45 Temperature (F): 98.7 Height (in): 61 Pulse (bpm): 90 Source: Stated Respiratory Rate (breaths/min): 16 Weight (lbs): 200 Blood Pressure (mmHg): 166/112 Source: Measured Reference Range: 80 - 120 mg / dl Body Mass Index (BMI): 37.8 Notes stated her BP runs high a lot and takes meds and follows with PCP and at home Electronic Signature(s) Signed: 03/01/2022 12:55:43 PM By: Donnamarie Poag Entered ByDonnamarie Poag on 03/01/2022 10:51:42

## 2022-03-01 NOTE — Progress Notes (Signed)
Sutliff, Analis ([id number]) ?Visit Report for 03/01/2022 ?Abuse Risk Screen Details ?Patient Name: [name], [name] ?Date of Service: 03/01/2022 10:00 AM ?Medical Record Number: [id number] ?Patient Account Number: [id number] ?Date of Birth/Sex: 1986-08-21 (36 y.o. F) ?Treating RN: Hansel Feinstein ?Primary Care Kleber Crean: Hezzie Bump Other Clinician: ?Referring Corrion Stirewalt: Gwyneth Revels ?Treating Giulianna Rocha/Extender: Geralyn Corwin ?Weeks in Treatment: 0 ?Abuse Risk Screen Items ?Answer ?ABUSE RISK SCREEN: ?Has anyone close to you tried to hurt or harm you recentlyo No ?Do you feel uncomfortable with anyone in your familyo No ?Has anyone forced you do things that you didnot want to doo No ?Electronic Signature(s) ?Signed: 03/01/2022 12:55:43 PM By: Hansel Feinstein ?Entered ByHansel Feinstein on 03/01/2022 10:55:02 ?Montoya, Julyssa ([id number]) ?-------------------------------------------------------------------------------- ?Activities of Daily Living Details ?Patient Name: [name], [name] ?Date of Service: 03/01/2022 10:00 AM ?Medical Record Number: [id number] ?Patient Account Number: [id number] ?Date of Birth/Sex: January 20, 1986 (36 y.o. F) ?Treating RN: Hansel Feinstein ?Primary Care Marisa Hage: Hezzie Bump Other Clinician: ?Referring Qianna Clagett: Gwyneth Revels ?Treating Fong Mccarry/Extender: Geralyn Corwin ?Weeks in Treatment: 0 ?Activities of Daily Living Items ?Answer ?Activities of Daily Living (Please select one for each item) ?Drive Automobile Not Able ?Take Medications Completely Able ?Use Telephone Completely Able ?Care for Appearance Completely Able ?Use Toilet Completely Able ?Bath / Shower Completely Able ?Dress Self Completely Able ?Feed Self Completely Able ?Walk Completely Able ?Get In / Out Bed Completely Able ?Housework Need Assistance ?Prepare Meals Completely Able ?Handle Money Completely Able ?Shop for Self Need Assistance ?Electronic Signature(s) ?Signed: 03/01/2022 12:55:43 PM By: Hansel Feinstein ?Entered ByHansel Feinstein on 03/01/2022  10:53:04 ?Vineyard, Zakiah ([id number]) ?-------------------------------------------------------------------------------- ?Education Screening Details ?Patient Name: [name], [name] ?Date of Service: 03/01/2022 10:00 AM ?Medical Record Number: [id number] ?Patient Account Number: [id number] ?Date of Birth/Sex: [birth year] (36 y.o. F) ?Treating RN: Hansel Feinstein ?Primary Care Jen Eppinger: Hezzie Bump Other Clinician: ?Referring Daion Ginsberg: Gwyneth Revels ?Treating Janautica Netzley/Extender: Geralyn Corwin ?Weeks in Treatment: 0 ?Primary Learner Assessed: Patient ?Learning Preferences/Education Level/Primary Language ?Learning Preference: Explanation ?Highest Education Level: High School ?Preferred Language: English ?Cognitive Barrier ?Language Barrier: No ?Translator Needed: No ?Memory Deficit: No ?Emotional Barrier: No ?Cultural/Religious Beliefs Affecting Medical Care: No ?Physical Barrier ?Impaired Vision: No ?Impaired Hearing: No ?Decreased Hand dexterity: No ?Knowledge/Comprehension ?Knowledge Level: High ?Comprehension Level: High ?Ability to understand written instructions: High ?Ability to understand verbal instructions: High ?Motivation ?Anxiety Level: Calm ?Cooperation: Cooperative ?Education Importance: Acknowledges Need ?Interest in Health Problems: Asks Questions ?Perception: Coherent ?Willingness to Engage in Self-Management ?High ?Activities: ?Readiness to Engage in Self-Management ?High ?Activities: ?Electronic Signature(s) ?Signed: 03/01/2022 12:55:43 PM By: Hansel Feinstein ?Entered ByHansel Feinstein on 03/01/2022 10:53:29 ?Minney, Murdis ([id number]) ?-------------------------------------------------------------------------------- ?Fall Risk Assessment Details ?Patient Name: [name], [name] ?Date of Service: 03/01/2022 10:00 AM ?Medical Record Number: [id number] ?Patient Account Number: [id number] ?Date of Birth/Sex: [birth year] (36 y.o. F) ?Treating RN: Hansel Feinstein ?Primary Care Jahlen Bollman: Hezzie Bump Other Clinician: ?Referring  Masoud Nyce: Gwyneth Revels ?Treating Harvin Konicek/Extender: Geralyn Corwin ?Weeks in Treatment: 0 ?Fall Risk Assessment Items ?Have you had 2 or more falls in the last 12 monthso 0 No ?Have you had any fall that resulted in injury in the last 12 monthso 0 No ?FALLS RISK SCREEN ?History of falling - immediate or within 3 months 0 No ?Secondary diagnosis (Do you have 2 or more medical diagnoseso) 0 No ?Ambulatory aid ?None/bed rest/wheelchair/nurse 0 Yes ?Crutches/cane/walker 0 No ?Furniture 0 No ?Intravenous therapy Access/Saline/Heparin Lock 0 No ?Gait/Transferring ?Normal/ bed rest/ wheelchair 0 Yes ?Weak (short steps with or without shuffle, stooped but able to lift head while walking, may ?  0 No ?seek support from furniture) ?Impaired (short steps with shuffle, may have difficulty arising from chair, head down, impaired ?0 No ?balance) ?Mental Status ?Oriented to own ability 0 Yes ?Electronic Signature(s) ?Signed: 03/01/2022 12:55:43 PM By: Hansel Feinstein ?Entered ByHansel Feinstein on 03/01/2022 10:53:50 ?Medellin, Willowdean ([id number]) ?-------------------------------------------------------------------------------- ?Foot Assessment Details ?Patient Name: [name], [name] ?Date of Service: 03/01/2022 10:00 AM ?Medical Record Number: [id number] ?Patient Account Number: [id number] ?Date of Birth/Sex: 08/20/1986 (36 y.o. F) ?Treating RN: Hansel Feinstein ?Primary Care Taira Knabe: Hezzie Bump Other Clinician: ?Referring Sher Hellinger: Gwyneth Revels ?Treating Maudell Stanbrough/Extender: Geralyn Corwin ?Weeks in Treatment: 0 ?Foot Assessment Items ?Site Locations ?+ = Sensation present, - = Sensation absent, C = Callus, U = Ulcer ?R = Redness, W = Warmth, M = Maceration, PU = Pre-ulcerative lesion ?F = Fissure, S = Swelling, D = Dryness ?Assessment ?Right: Left: ?Other Deformity: No No ?Prior Foot Ulcer: No No ?Prior Amputation: No No ?Charcot Joint: No No ?Ambulatory Status: Ambulatory Without Help ?Gait: Steady ?Notes ?spina bifida; states cannot move  toes and no sensation in most of foot ?Electronic Signature(s) ?Signed: 03/01/2022 12:55:43 PM By: Hansel Feinstein ?Entered ByHansel Feinstein on 03/01/2022 10:54:52 ?Mura, Bresha ([id number]) ?-------------------------------------------------------------------------------- ?Nutrition Risk Screening Details ?Patient Name: [name], [name] ?Date of Service: 03/01/2022 10:00 AM ?Medical Record Number: [id number] ?Patient Account Number: [id number] ?Date of Birth/Sex: [birth year] (36 y.o. F) ?Treating RN: Hansel Feinstein ?Primary Care Bently Wyss: Hezzie Bump Other Clinician: ?Referring Kurtiss Wence: Gwyneth Revels ?Treating Shatona Andujar/Extender: Geralyn Corwin ?Weeks in Treatment: 0 ?Height (in): 61 ?Weight (lbs): 200 ?Body Mass Index (BMI): 37.8 ?Nutrition Risk Screening Items ?Score Screening ?NUTRITION RISK SCREEN: ?I have an illness or condition that made me change the kind and/or amount of food I eat 0 No ?I eat fewer than two meals per day 0 No ?I eat few fruits and vegetables, or milk products 0 No ?I have three or more drinks of beer, liquor or wine almost every day 0 No ?I have tooth or mouth problems that make it hard for me to eat 0 No ?I don't always have enough money to buy the food I need 0 No ?I eat alone most of the time 0 No ?I take three or more different prescribed or over-the-counter drugs a day 1 Yes ?Without wanting to, I have lost or gained 10 pounds in the last six months 0 No ?I am not always physically able to shop, cook and/or feed myself 0 No ?Nutrition Protocols ?Good Risk Protocol 0 No interventions needed ?Moderate Risk Protocol ?High Risk Proctocol ?Risk Level: Good Risk ?Score: 1 ?Electronic Signature(s) ?Signed: 03/01/2022 12:55:43 PM By: Hansel Feinstein ?Entered ByHansel Feinstein on 03/01/2022 10:54:04 ?

## 2022-03-07 ENCOUNTER — Emergency Department
Admission: EM | Admit: 2022-03-07 | Discharge: 2022-03-07 | Disposition: A | Discharge status: Home / Self Care | Payer: Medicaid Other | Attending: Emergency Medicine | Admitting: Emergency Medicine

## 2022-03-07 ENCOUNTER — Emergency Department: Payer: Medicaid Other

## 2022-03-07 ENCOUNTER — Other Ambulatory Visit: Payer: Self-pay

## 2022-03-07 ENCOUNTER — Encounter: Payer: Self-pay | Admitting: Emergency Medicine

## 2022-03-07 DIAGNOSIS — J209 Acute bronchitis, unspecified: Secondary | ICD-10-CM | POA: Insufficient documentation

## 2022-03-07 DIAGNOSIS — H9201 Otalgia, right ear: Secondary | ICD-10-CM | POA: Diagnosis present

## 2022-03-07 DIAGNOSIS — H66001 Acute suppurative otitis media without spontaneous rupture of ear drum, right ear: Secondary | ICD-10-CM | POA: Diagnosis not present

## 2022-03-07 DIAGNOSIS — R030 Elevated blood-pressure reading, without diagnosis of hypertension: Secondary | ICD-10-CM

## 2022-03-07 DIAGNOSIS — N189 Chronic kidney disease, unspecified: Secondary | ICD-10-CM | POA: Insufficient documentation

## 2022-03-07 DIAGNOSIS — I129 Hypertensive chronic kidney disease with stage 1 through stage 4 chronic kidney disease, or unspecified chronic kidney disease: Secondary | ICD-10-CM | POA: Insufficient documentation

## 2022-03-07 DIAGNOSIS — E1122 Type 2 diabetes mellitus with diabetic chronic kidney disease: Secondary | ICD-10-CM | POA: Diagnosis not present

## 2022-03-07 MED ORDER — DOXYCYCLINE HYCLATE 100 MG PO CAPS: 100.0000 mg | ORAL_CAPSULE | Freq: Two times a day (BID) | ORAL | 0 refills | Status: DC

## 2022-03-07 MED ORDER — ALBUTEROL SULFATE HFA 108 (90 BASE) MCG/ACT IN AERS: 2.0000 | INHALATION_SPRAY | Freq: Four times a day (QID) | RESPIRATORY_TRACT | 2 refills | Status: AC | PRN

## 2022-03-07 MED ORDER — IPRATROPIUM-ALBUTEROL 0.5-2.5 (3) MG/3ML IN SOLN
3.0000 mL | Freq: Once | RESPIRATORY_TRACT | Status: AC
  Administered 2022-03-07: 3 mL via RESPIRATORY_TRACT
  Filled 2022-03-07: qty 3

## 2022-03-07 NOTE — ED Provider Notes (Signed)
? ?[facility] ?Provider Note ? ? ? Event Date/Time  ? First MD Initiated Contact with Patient 03/07/22 0831   ?  (approximate) ? ? ?History  ? ?Ear Fullness ? ? ?HPI ? ?[name] is a 36 y.o. female   presents to the ED with complaint of right ear pain and decreased hearing.  Patient also complains of congestion and a cough that has been going on for approximately 1 week.  She denies any fever and is a non-smoker.  She states at times there is some shortness of breath.  No prior history of asthma.  Patient does have a history of hypertension, GERD, sickle cell trait, chronic kidney disease, diabetes mellitus, spina bifida, cerebral palsy, neurogenic bladder, myasthenia gravis and urinary tract infections.  She denies any fever and states her husband was seen last week and tested negative for influenza and COVID. ? ?  ? ? ?Physical Exam  ? ?Triage Vital Signs: ?ED Triage Vitals  ?Enc Vitals Group  ?   BP 03/07/22 0830 (!) 174/119  ?   Pulse Rate 03/07/22 0830 99  ?   Resp 03/07/22 0830 20  ?   Temp 03/07/22 0830 98.7 ?F (37.1 ?C)  ?   Temp src --   ?   SpO2 03/07/22 0830 97 %  ?   Weight 03/07/22 0831 200 lb (90.7 kg)  ?   Height 03/07/22 0831 5\' 4"  (1.626 m)  ?   Head Circumference --   ?   Peak Flow --   ?   Pain Score 03/07/22 0831 8  ?   Pain Loc --   ?   Pain Edu? --   ?   Excl. in GC? --   ? ? ?Most recent vital signs: ?Vitals:  ? 03/07/22 0925 03/07/22 1024  ?BP: (!) 156/104 (!) 150/99  ?Pulse: 87 80  ?Resp: 20 18  ?Temp:    ?SpO2: 97% 98%  ? ? ? ?General: Awake, no distress.  ?CV:  Good peripheral perfusion.  Heart regular rate and rhythm. ?Resp:  Normal effort.  Lungs with coarse cough and rales.  No expiratory wheezes are heard. ?Abd:  No distention.  ?Other:  EACs are clear bilaterally.  TMs are dull with right TM erythematous and injection noted.  Poor light reflex present on the right.  Posterior pharynx without erythema and uvula is midline.  Neck is supple without cervical  lymphadenopathy. ? ? ?ED Results / Procedures / Treatments  ? ?Labs ?(all labs ordered are listed, but only abnormal results are displayed) ?Labs Reviewed - No data to display ? ? ? ?RADIOLOGY ?2 view chest x-ray images were reviewed and no infiltrate was noted.  Radiology report shows mild right basilar atelectasis. ? ? ? ?PROCEDURES: ? ?Critical Care performed:  ? ?Procedures ? ? ?MEDICATIONS ORDERED IN ED: ?Medications  ?ipratropium-albuterol (DUONEB) 0.5-2.5 (3) MG/3ML nebulizer solution 3 mL (3 mLs Nebulization Given 03/07/22 0939)  ? ? ? ?IMPRESSION / MDM / ASSESSMENT AND PLAN / ED COURSE  ?I reviewed the triage vital signs and the nursing notes. ? ? ?Differential diagnosis includes, but is not limited to, viral upper respiratory infection with cough, otitis media, cerumen impaction, bronchitis, pneumonia. ? ?36 year old female presents to the ED with complaint of right ear pain.  Patient is unaware of any fever but states she has had cough and congestion.  Patient was given a DuoNeb treatment and was able to breathe much easier and rales was cleared prior to  discharge.  There is some mild changes noted to the right TM for possibly an early otitis media.  Patient does have a very congested cough with some rales which cleared with DuoNeb nebulizer treatment and patient states that she feels better.  A prescription for an albuterol inhaler was sent to the pharmacy and patient was placed on doxycycline 100 mg twice daily.  She is encouraged to drink fluids and take Tylenol if needed for fever or body aches.  We also discussed her elevated blood pressure but she states that this is her normal and prior to discharge her blood pressure was 150/99.  She still encouraged to follow-up with her PCP to have her blood pressure rechecked. ? ? ?FINAL CLINICAL IMPRESSION(S) / ED DIAGNOSES  ? ?Final diagnoses:  ?Non-recurrent acute suppurative otitis media of right ear without spontaneous rupture of tympanic membrane  ?Acute  bronchitis, unspecified organism  ?Elevated blood pressure reading  ? ? ? ?Rx / DC Orders  ? ?ED Discharge Orders   ? ?      Ordered  ?  albuterol (VENTOLIN HFA) 108 (90 Base) MCG/ACT inhaler  Every 6 hours PRN       ? 03/07/22 1010  ?  doxycycline (VIBRAMYCIN) 100 MG capsule  2 times daily       ? 03/07/22 1010  ? ?  ?  ? ?  ? ? ? ?Note:  This document was prepared using Dragon voice recognition software and may include unintentional dictation errors. ?  ?Tommi Rumps, PA-C ?03/07/22 1243 ? ?  ?Jene Every, MD ?03/07/22 1248 ? ?

## 2022-03-07 NOTE — ED Triage Notes (Signed)
Pt to ED via POV with c/o of not being able to hear out of her right ear. 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 NAD ?

## 2022-03-07 NOTE — Discharge Instructions (Addendum)
Follow-up with your primary care provider if any continued problems or concerns.  An albuterol inhaler and doxycycline was sent to the pharmacy to take care of of your ear infection and also bronchitis.  Drink lots of fluids to stay hydrated.  You may also take Tylenol or ibuprofen as needed.  Saline nose spray will not interfere with any of your medications.  Continue with the prednisone that you are currently taking on a daily basis. ?

## 2022-03-08 ENCOUNTER — Encounter (HOSPITAL_BASED_OUTPATIENT_CLINIC_OR_DEPARTMENT_OTHER): Payer: Medicaid Other | Admitting: Internal Medicine

## 2022-03-08 DIAGNOSIS — I87311 Chronic venous hypertension (idiopathic) with ulcer of right lower extremity: Secondary | ICD-10-CM | POA: Diagnosis not present

## 2022-03-08 DIAGNOSIS — L97812 Non-pressure chronic ulcer of other part of right lower leg with fat layer exposed: Secondary | ICD-10-CM

## 2022-03-08 NOTE — Progress Notes (Signed)
Kadow, Marzell ([id number]) ?Visit Report for 03/08/2022 ?Arrival Information Details ?Patient Name: [name], [name] ?Date of Service: 03/08/2022 11:30 AM ?Medical Record Number: [id number] ?Patient Account Number: [id number] ?Date of Birth/Sex: 06-20-1986 (36 y.o. F) ?Treating RN: Donnamarie Poag ?Primary Care Paityn Balsam: Maryruth Hancock Other Clinician: ?Referring Christi Wirick: Maryruth Hancock ?Treating Laurel Harnden/Extender: Kalman Shan ?Weeks in Treatment: 1 ?Visit Information History Since Last Visit ?Added or deleted any medications: No ?Patient Arrived: Kasandra Knudsen ?Had a fall or experienced change in No ?Arrival Time: 11:16 ?activities of daily living that may affect ?Accompanied By: self ?risk of falls: ?Transfer Assistance: EasyP[employer] Patient ?Hospitalized since last visit: No ?Lift ?Has Dressing in Place as Prescribed: Yes ?Patient Identification Verified: Yes ?Pain Present Now: Yes ?Secondary Verification Process Completed: Yes ?Patient Requires Transmission-Based No ?Precautions: ?Patient Has Alerts: Yes ?Electronic Signature(s) ?Signed: 03/08/2022 11:56:30 AM By: Donnamarie Poag ?Entered ByDonnamarie Poag on 03/08/2022 11:20:59 ?Zenker, Tamalyn ([id number]) ?-------------------------------------------------------------------------------- ?Encounter Discharge Information Details ?Patient Name: [name], [name] ?Date of Service: 03/08/2022 11:30 AM ?Medical Record Number: [id number] ?Patient Account Number: [id number] ?Date of Birth/Sex: [birth year] (36 y.o. F) ?Treating RN: Donnamarie Poag ?Primary Care Josephene Marrone: Maryruth Hancock Other Clinician: ?Referring Myosha Cuadras: Maryruth Hancock ?Treating Carly Applegate/Extender: Kalman Shan ?Weeks in Treatment: 1 ?Encounter Discharge Information Items Post Procedure Vitals ?Discharge Condition: Stable ?Temperature (?F): 98.1 ?Ambulatory Status: Kasandra Knudsen ?Pulse (bpm): 92 ?Discharge Destination: Home ?Respiratory Rate (breaths/min): 16 ?Transportation: Private Auto ?Blood Pressure (mmHg): 191/93 ?Accompanied By: self ?Schedule  Follow-up Appointment: Yes ?Clinical Summary of Care: ?Electronic Signature(s) ?Signed: 03/08/2022 11:56:30 AM By: Donnamarie Poag ?Entered ByDonnamarie Poag on 03/08/2022 11:51:17 ?Wilhelmi, Stephanye ([id number]) ?-------------------------------------------------------------------------------- ?Lower Extremity Assessment Details ?Patient Name: [name], [name] ?Date of Service: 03/08/2022 11:30 AM ?Medical Record Number: [id number] ?Patient Account Number: [id number] ?Date of Birth/Sex: [birth year] (36 y.o. F) ?Treating RN: Donnamarie Poag ?Primary Care Lagena Strand: Maryruth Hancock Other Clinician: ?Referring Roxi Hlavaty: Maryruth Hancock ?Treating Darragh Nay/Extender: Kalman Shan ?Weeks in Treatment: 1 ?Edema Assessment ?Assessed: [Left: No] [Right: No] ?[Left: Edema] [Right: :] ?Calf ?Left: Right: ?Point of Measurement: 35 cm From Medial Instep 37 cm ?Ankle ?Left: Right: ?Point of Measurement: 11 cm From Medial Instep 24 cm ?Knee To Floor ?Left: Right: ?From Medial Instep 43 cm ?Vascular Assessment ?Pulses: ?Dorsalis Pedis ?Palpable: [Right:Yes] ?Electronic Signature(s) ?Signed: 03/08/2022 11:56:30 AM By: Donnamarie Poag ?Entered ByDonnamarie Poag on 03/08/2022 11:28:10 ?Duesing, Angelamarie ([id number]) ?-------------------------------------------------------------------------------- ?Multi Wound Chart Details ?Patient Name: [name], [name] ?Date of Service: 03/08/2022 11:30 AM ?Medical Record Number: [id number] ?Patient Account Number: [id number] ?Date of Birth/Sex: August 14, 1986 (36 y.o. F) ?Treating RN: Donnamarie Poag ?Primary Care Mckenna Boruff: Maryruth Hancock Other Clinician: ?Referring Nathanel Tallman: Maryruth Hancock ?Treating Josmar Messimer/Extender: Kalman Shan ?Weeks in Treatment: 1 ?Vital Signs ?Height(in): 61 ?Pulse(bpm): 92 ?Weight(lbs): 200 ?Blood Pressure(mmHg): 191/93 ?Body Mass Index(BMI): 37.8 ?Temperature(??F): 98 ?Respiratory Rate(breaths/min): 16 ?Photos: [N/A:N/A] ?Wound Location: Right, Lateral Lower Leg N/A N/A ?Wounding Event: Bump N/A N/A ?Primary Etiology:  Pressure Ulcer N/A N/A ?Secondary Etiology: Infection - not elsewhere classified N/A N/A ?Comorbid History: Hypertension, Type II Diabetes, N/A N/A ?Osteoarthritis, Neuropathy, ?Paraplegia ?Date Acquired: 01/07/2022 N/A N/A ?Weeks of Treatment: 1 N/A N/A ?Wound Status: Open N/A N/A ?Wound Recurrence: No N/A N/A ?Measurements L x W x D (cm) 0.9x1.2x0.5 N/A N/A ?Area (cm?) : 0.848 N/A N/A ?Volume (cm?) : 0.424 N/A N/A ?% Reduction in Area: 16.90% N/A N/A ?% Reduction in Volume: 17.00% N/A N/A ?Classification: Category/Stage III N/A N/A ?Exudate Amount: Large N/A N/A ?Exudate Type: Serosanguineous N/A N/A ?Exudate Color: red, brown N/A N/A ?Granulation Amount: Large (67-100%) N/A N/A ?Granulation Quality: Red,  Hyper-granulation N/A N/A ?Necrotic Amount: Small (1-33%) N/A N/A ?Exposed Structures: ?Fat Layer (Subcutaneous Tissue): N/A N/A ?Yes ?Fascia: No ?Tendon: No ?Muscle: No ?Joint: No ?Bone: No ?Treatment Notes ?Electronic Signature(s) ?Signed: 03/08/2022 11:56:30 AM By: Donnamarie Poag ?Entered ByDonnamarie Poag on 03/08/2022 11:32:05 ?Heintzelman, Harmonii ([id number]) ?-------------------------------------------------------------------------------- ?Multi-Disciplinary Care Plan Details ?Patient Name: [name], [name] ?Date of Service: 03/08/2022 11:30 AM ?Medical Record Number: [id number] ?Patient Account Number: [id number] ?Date of Birth/Sex: Feb 22, 1986 (36 y.o. F) ?Treating RN: Donnamarie Poag ?Primary Care Atoya Andrew: Maryruth Hancock Other Clinician: ?Referring Ashla Murph: Maryruth Hancock ?Treating Jaleil Renwick/Extender: Kalman Shan ?Weeks in Treatment: 1 ?Active Inactive ?Wound/Skin Impairment ?Nursing Diagnoses: ?Impaired tissue integrity ?Knowledge deficit related to smoking impact on wound healing ?Knowledge deficit related to ulceration/compromised skin integrity ?Goals: ?Patient/caregiver will verbalize understanding of skin care regimen ?Date Initiated: 03/01/2022 ?Date Inactivated: 03/08/2022 ?Target Resolution Date: 03/17/2022 ?Goal Status:  Met ?Ulcer/skin breakdown will have a volume reduction of 30% by week 4 ?Date Initiated: 03/01/2022 ?Target Resolution Date: 03/29/2022 ?Goal Status: Active ?Ulcer/skin breakdown will have a volume reduction of 50% by week 8 ?Date Initiated: 03/01/2022 ?Target Resolution Date: 04/26/2022 ?Goal Status: Active ?Ulcer/skin breakdown will have a volume reduction of 80% by week 12 ?Date Initiated: 03/01/2022 ?Target Resolution Date: 05/24/2022 ?Goal Status: Active ?Ulcer/skin breakdown will heal within 14 weeks ?Date Initiated: 03/01/2022 ?Target Resolution Date: 06/07/2022 ?Goal Status: Active ?Interventions: ?Assess patient/caregiver ability to obtain necessary supplies ?Assess patient/caregiver ability to perform ulcer/skin care regimen upon admission and as needed ?Assess ulceration(s) every visit ?Notes: ?Electronic Signature(s) ?Signed: 03/08/2022 11:56:30 AM By: Donnamarie Poag ?Entered ByDonnamarie Poag on 03/08/2022 11:28:26 ?[name], [name] ([id number]) ?-------------------------------------------------------------------------------- ?Pain Assessment Details ?Patient Name: [name], [name] ?Date of Service: 03/08/2022 11:30 AM ?Medical Record Number: [id number] ?Patient Account Number: [id number] ?Date of Birth/Sex: September 08, 1986 (36 y.o. F) ?Treating RN: Donnamarie Poag ?Primary Care Pravin Perezperez: Maryruth Hancock Other Clinician: ?Referring Anela Bensman: Maryruth Hancock ?Treating Talya Quain/Extender: Kalman Shan ?Weeks in Treatment: 1 ?Active Problems ?Location of Pain Severity and Description of Pain ?Patient Has Paino Yes ?Site Locations ?Pain Location: ?Generalized Pain, Pain in Ulcers ?Rate the pain. ?Current Pain Level: 1 ?Pain Management and Medication ?Current Pain Management: ?Electronic Signature(s) ?Signed: 03/08/2022 11:56:30 AM By: Donnamarie Poag ?Entered ByDonnamarie Poag on 03/08/2022 11:22:09 ?[name], [name] ([id number]) ?-------------------------------------------------------------------------------- ?Patient/Caregiver Education Details ?Patient  Name: [name], [name] ?Date of Service: 03/08/2022 11:30 AM ?Medical Record Number: [id number] ?Patient Account Number: [id number] ?Date of Birth/Gender: [birth year] (36 y.o. F) ?Treating RN: Donnamarie Poag ?Prim

## 2022-03-08 NOTE — Progress Notes (Signed)
Dobbins, Carlota ([id number]) ?Visit Report for 03/08/2022 ?Chief Complaint Document Details ?Patient Name: [name], [name] ?Date of Service: 03/08/2022 11:30 AM ?Medical Record Number: [id number] ?Patient Account Number: [id number] ?Date of Birth/Sex: Dec 03, 1986 (36 y.o. F) ?Treating RN: Donnamarie Poag ?Primary Care Provider: Maryruth Hancock Other Clinician: ?Referring Provider: Maryruth Hancock ?Treating Provider/Extender: Kalman Shan ?Weeks in Treatment: 1 ?Information Obtained from: Patient ?Chief Complaint ?Right lower extremity wound ?Electronic Signature(s) ?Signed: 03/08/2022 12:10:00 PM By: Kalman Shan DO ?Entered By: Kalman Shan on 03/08/2022 12:02:41 ?Wedig, Osie ([id number]) ?-------------------------------------------------------------------------------- ?Debridement Details ?Patient Name: [name], [name] ?Date of Service: 03/08/2022 11:30 AM ?Medical Record Number: [id number] ?Patient Account Number: [id number] ?Date of Birth/Sex: July 10, 1986 (36 y.o. F) ?Treating RN: Donnamarie Poag ?Primary Care Provider: Maryruth Hancock Other Clinician: ?Referring Provider: Maryruth Hancock ?Treating Provider/Extender: Kalman Shan ?Weeks in Treatment: 1 ?Debridement Performed for ?Wound #1 Right,Lateral Lower Leg ?Assessment: ?Performed By: Physician Kalman Shan, MD ?Debridement Type: Debridement ?Level of Consciousness (Pre- ?Awake and Alert ?procedure): ?Pre-procedure Verification/Time Out ?Yes - 11:31 ?Taken: ?Start Time: 11:32 ?Pain Control: Lidocaine ?Total Area Debrided (L x W): 0.9 (cm) x 1.2 (cm) = 1.08 (cm?) ?Tissue and other material ?Viable, Non-Viable, Slough, Subcutaneous, [place] ?debrided: ?Level: Skin/Subcutaneous Tissue ?Debridement Description: Excisional ?Instrument: Curette ?Bleeding: Minimum ?Hemostasis Achieved: Pressure ?Response to Treatment: Procedure was tolerated well ?Level of Consciousness (Post- ?Awake and Alert ?procedure): ?Post Debridement Measurements of Total Wound ?Length: (cm) 0.9 ?Stage:  Category/Stage III ?Width: (cm) 1.2 ?Depth: (cm) 0.5 ?Volume: (cm?) 0.424 ?Character of Wound/Ulcer Post Debridement: Improved ?Post Procedure Diagnosis ?Same as Pre-procedure ?Electronic Signature(s) ?Signed: 03/08/2022 11:56:30 AM By: Donnamarie Poag ?Signed: 03/08/2022 12:10:00 PM By: Kalman Shan DO ?Entered ByDonnamarie Poag on 03/08/2022 11:34:35 ?Lisenby, Shuntell ([id number]) ?-------------------------------------------------------------------------------- ?HPI Details ?Patient Name: [name], [name] ?Date of Service: 03/08/2022 11:30 AM ?Medical Record Number: [id number] ?Patient Account Number: [id number] ?Date of Birth/Sex: Oct 01, 1986 (36 y.o. F) ?Treating RN: Donnamarie Poag ?Primary Care Provider: Maryruth Hancock Other Clinician: ?Referring Provider: Maryruth Hancock ?Treating Provider/Extender: Kalman Shan ?Weeks in Treatment: 1 ?History of Present Illness ?HPI Description: Admission 03/01/2022 ?Ms. Rozenia Swansen is a 36 year old female with a past medical history of cerebral palsy with leg braces, spina bifida and myasthenia gravis that ?presents to the clinic for a 47-month history of nonhealing ulcer to the right lower extremity. She states that the brace was rubbing up against her ?leg causing a blister and subsequently developed a wound. She has stopped wearing the brace since her wound developed. She originally saw ?urgent care on 01/07/2022 for this issue. They prescribed doxycycline at that time. She has been keeping the wound open to air. She covers it ?when she goes outside. She recently saw her podiatrist, Dr. Vickki Muff on 02/16/2022 and he referred her to our clinic. He also prescribed her ?doxycycline. She currently denies signs of infection. ?3/15; patient presents for follow-up. She has been using Hydrofera Blue dressing without issues. She denies signs of infection. ?Electronic Signature(s) ?Signed: 03/08/2022 12:10:00 PM By: Kalman Shan DO ?Entered By: Kalman Shan on 03/08/2022 12:03:04 ?Hayne,  Mahaley ([id number]) ?-------------------------------------------------------------------------------- ?Physical Exam Details ?Patient Name: [name], [name] ?Date of Service: 03/08/2022 11:30 AM ?Medical Record Number: [id number] ?Patient Account Number: [id number] ?Date of Birth/Sex: 15-Jun-1986 (36 y.o. F) ?Treating RN: Donnamarie Poag ?Primary Care Provider: Maryruth Hancock Other Clinician: ?Referring Provider: Maryruth Hancock ?Treating Provider/Extender: Kalman Shan ?Weeks in Treatment: 1 ?Constitutional ?. ?Cardiovascular ?Marland Kitchen ?Psychiatric ?Marland Kitchen ?Notes ?Right lower extremity: Open wound with granulation tissue and scant nonviable tissue. No signs of surrounding infection. ?Electronic Signature(s) ?Signed: 03/08/2022 12:10:00  PM By: Kalman Shan DO ?Entered By: Kalman Shan on 03/08/2022 12:03:25 ?Winterhalter, Karianne ([id number]) ?-------------------------------------------------------------------------------- ?Physician Orders Details ?Patient Name: [name], [name] ?Date of Service: 03/08/2022 11:30 AM ?Medical Record Number: [id number] ?Patient Account Number: [id number] ?Date of Birth/Sex: Dec 10, 1986 (36 y.o. F) ?Treating RN: Donnamarie Poag ?Primary Care Provider: Maryruth Hancock Other Clinician: ?Referring Provider: Maryruth Hancock ?Treating Provider/Extender: Kalman Shan ?Weeks in Treatment: 1 ?Verbal / Phone Orders: No ?Diagnosis Coding ?Follow-up Appointments ?o Return Appointment in 1 week. ?o Nurse Visit as needed ?Bathing/ Shower/ Hygiene ?o Clean wound with Normal Saline or wound cleanser. - keep dressing dry or change after shower ?o May shower with wound dressing protected with water repellent cover or cast protector. ?o No tub bath. ?Anesthetic (Use 'Patient Medications' Section for Anesthetic Order Entry) ?o Lidocaine applied to wound bed ?Edema Control - Lymphedema / Segmental Compressive Device / Other ?o Optional: One layer of unna paste to top of compression wrap (to act as an anchor). ?o Elevate legs to  the level of the heart and pump ankles as often as possible ?Additional Orders / Instructions ?o Follow Nutritious Diet and Increase Protein Intake ?Wound Treatment ?Wound #1 - Lower Leg Wound Laterality: Right, Lateral ?Cleanser: Wound Cleanser 1 x Per Week/15 Days ?Discharge Instructions: Wash your hands with soap and water. Remove old dressing, discard into plastic bag and place into trash. ?Cleanse the wound with Wound Cleanser prior to applying a clean dressing using gauze sponges, not tissues or cotton balls. Do not ?scrub or use excessive force. Pat dry using gauze sponges, not tissue or cotton balls. ?Topical: Gentamicin 1 x Per Week/15 Days ?Discharge Instructions: In office only-Apply as directed by provider. ?Primary Dressing: Hydrofera Blue Ready Transfer Foam, 2.5x2.5 (in/in) 1 x Per Week/15 Days ?Discharge Instructions: Apply Hydrofera Blue Ready to wound bed as directed ?Secondary Dressing: ABD Pad 5x9 (in/in) 1 x Per Week/15 Days ?Discharge Instructions: Cover with ABD pad ?Secured With: Medipore Tape - 44M Medipore H Soft Cloth Surgical Tape, 2x2 (in/yd) 1 x Per Week/15 Days ?Secured With: Coban Cohesive Bandage 4x5 (yds) Stretched 1 x Per Week/15 Days ?Discharge Instructions: layer 2-Apply Coban as directed. ?Secured With: [employer] or Non-Sterile 6-ply 4.5x4 (yd/yd) 1 x Per Week/15 Days ?Discharge Instructions: layer 1-Apply Kerlix as directed ?Electronic Signature(s) ?Signed: 03/08/2022 12:10:00 PM By: Kalman Shan DO ?Previous Signature: 03/08/2022 11:56:30 AM Version By: Donnamarie Poag ?Entered By: Kalman Shan on 03/08/2022 12:09:18 ?Janosik, Saadia ([id number]) ?-------------------------------------------------------------------------------- ?Problem List Details ?Patient Name: NOVIA, KANIA ?Date of Service: 03/08/2022 11:30 AM ?Medical Record Number: [id number] ?Patient Account Number: [id number] ?Date of Birth/Sex: Oct 15, 1986 (36 y.o. F) ?Treating RN: Donnamarie Poag ?Primary  Care Provider: Maryruth Hancock Other Clinician: ?Referring Provider: Maryruth Hancock ?Treating Provider/Extender: Kalman Shan ?Weeks in Treatment: 1 ?Active Problems ?ICD-10 ?Encounter ?Code Description Active Date MDM

## 2022-03-15 ENCOUNTER — Other Ambulatory Visit: Payer: Self-pay

## 2022-03-15 ENCOUNTER — Encounter (HOSPITAL_BASED_OUTPATIENT_CLINIC_OR_DEPARTMENT_OTHER): Payer: Medicaid Other | Admitting: Internal Medicine

## 2022-03-15 DIAGNOSIS — L97812 Non-pressure chronic ulcer of other part of right lower leg with fat layer exposed: Secondary | ICD-10-CM

## 2022-03-15 DIAGNOSIS — I87311 Chronic venous hypertension (idiopathic) with ulcer of right lower extremity: Secondary | ICD-10-CM | POA: Diagnosis not present

## 2022-03-15 NOTE — Progress Notes (Signed)
Smithson, Jearldean ([id number]) ?Visit Report for 03/15/2022 ?Chief Complaint Document Details ?Patient Name: [name], [name] ?Date of Service: 03/15/2022 11:30 AM ?Medical Record Number: [id number] ?Patient Account Number: [id number] ?Date of Birth/Sex: [birth year] (36 y.o. F) ?Treating RN: Donnamarie Poag ?Primary Care Provider: Maryruth Hancock Other Clinician: ?Referring Provider: Maryruth Hancock ?Treating Provider/Extender: Kalman Shan ?Weeks in Treatment: 2 ?Information Obtained from: Patient ?Chief Complaint ?Right lower extremity wound ?Electronic Signature(s) ?Signed: 03/15/2022 12:05:24 PM By: Kalman Shan DO ?Entered By: Kalman Shan on 03/15/2022 12:01:02 ?[name], [name] ([id number]) ?-------------------------------------------------------------------------------- ?Debridement Details ?Patient Name: [name], [name] ?Date of Service: 03/15/2022 11:30 AM ?Medical Record Number: [id number] ?Patient Account Number: [id number] ?Date of Birth/Sex: [birth year] (36 y.o. F) ?Treating RN: Donnamarie Poag ?Primary Care Provider: Maryruth Hancock Other Clinician: ?Referring Provider: Maryruth Hancock ?Treating Provider/Extender: Kalman Shan ?Weeks in Treatment: 2 ?Debridement Performed for ?Wound #1 Right,Lateral Lower Leg ?Assessment: ?Performed By: Physician Kalman Shan, MD ?Debridement Type: Debridement ?Level of Consciousness (Pre- ?Awake and Alert ?procedure): ?Pre-procedure Verification/Time Out ?Yes - 11:18 ?Taken: ?Start Time: 11:19 ?Pain Control: Lidocaine ?Total Area Debrided (L x W): 0.5 (cm) x 0.5 (cm) = 0.25 (cm?) ?Tissue and other material ?Viable, Non-Viable, Slough, Subcutaneous, [place] ?debrided: ?Level: Skin/Subcutaneous Tissue ?Debridement Description: Excisional ?Instrument: Curette ?Bleeding: Minimum ?Hemostasis Achieved: Pressure ?Response to Treatment: Procedure was tolerated well ?Level of Consciousness (Post- ?Awake and Alert ?procedure): ?Post Debridement Measurements of Total Wound ?Length: (cm) 0.5 ?Stage:  Category/Stage III ?Width: (cm) 0.5 ?Depth: (cm) 0.3 ?Volume: (cm?) 0.059 ?Character of Wound/Ulcer Post Debridement: Improved ?Post Procedure Diagnosis ?Same as Pre-procedure ?Electronic Signature(s) ?Signed: 03/15/2022 12:05:24 PM By: Kalman Shan DO ?Signed: 03/15/2022 4:03:21 PM By: Donnamarie Poag ?Entered ByDonnamarie Poag on 03/15/2022 11:22:05 ?[name], [name] ([id number]) ?-------------------------------------------------------------------------------- ?HPI Details ?Patient Name: [name], [name] ?Date of Service: 03/15/2022 11:30 AM ?Medical Record Number: [id number] ?Patient Account Number: [id number] ?Date of Birth/Sex: 06-23-1986 (36 y.o. F) ?Treating RN: Donnamarie Poag ?Primary Care Provider: Maryruth Hancock Other Clinician: ?Referring Provider: Maryruth Hancock ?Treating Provider/Extender: Kalman Shan ?Weeks in Treatment: 2 ?History of Present Illness ?HPI Description: Admission 03/01/2022 ?Ms. [name] is a 36 year old female with a past medical history of cerebral palsy with leg braces, spina bifida and myasthenia gravis that ?presents to the clinic for a 55-month history of nonhealing ulcer to the right lower extremity. She states that the brace was rubbing up against her ?leg causing a blister and subsequently developed a wound. She has stopped wearing the brace since her wound developed. She originally saw ?urgent care on 01/07/2022 for this issue. They prescribed doxycycline at that time. She has been keeping the wound open to air. She covers it ?when she goes outside. She recently saw her podiatrist, Dr. Vickki [name] on 02/16/2022 and he referred her to our clinic. He also prescribed her ?doxycycline. She currently denies signs of infection. ?3/15; patient presents for follow-up. She has been using Hydrofera Blue dressing without issues. She denies signs of infection. ?3/22; patient presents for follow-up. She tolerated the compression wrap well. She has no issues or complaints today. She brought in paperwork ?to  be filled out for an accommodation request for work. ?Electronic Signature(s) ?Signed: 03/15/2022 12:05:24 PM By: Kalman Shan DO ?Entered By: Kalman Shan on 03/15/2022 12:01:50 ?[name], [name] ([id number]) ?-------------------------------------------------------------------------------- ?Physical Exam Details ?Patient Name: [name], [name] ?Date of Service: 03/15/2022 11:30 AM ?Medical Record Number: [id number] ?Patient Account Number: [id number] ?Date of Birth/Sex: 06/22/1986 (36 y.o. F) ?Treating RN: Donnamarie Poag ?Primary Care Provider: Maryruth Hancock Other Clinician: ?Referring Provider: Maryruth Hancock ?Treating Provider/Extender: Kalman Shan ?Weeks  in Treatment: 2 ?Constitutional ?. ?Cardiovascular ?Marland Kitchen ?Psychiatric ?Marland Kitchen ?Notes ?Right lower extremity: Open wound with granulation tissue and Slough. No signs of surrounding infection. ?Electronic Signature(s) ?Signed: 03/15/2022 12:05:24 PM By: Kalman Shan DO ?Entered By: Kalman Shan on 03/15/2022 12:02:43 ?[name], [name] ([id number]) ?-------------------------------------------------------------------------------- ?Physician Orders Details ?Patient Name: [name], [name] ?Date of Service: 03/15/2022 11:30 AM ?Medical Record Number: [id number] ?Patient Account Number: [id number] ?Date of Birth/Sex: [birth year] (36 y.o. F) ?Treating RN: Donnamarie Poag ?Primary Care Provider: Maryruth Hancock Other Clinician: ?Referring Provider: Maryruth Hancock ?Treating Provider/Extender: Kalman Shan ?Weeks in Treatment: 2 ?Verbal / Phone Orders: No ?Diagnosis Coding ?Follow-up Appointments ?o Return Appointment in 1 week. ?o Nurse Visit as needed ?Bathing/ Shower/ Hygiene ?o Clean wound with Normal Saline or wound cleanser. - keep dressing dry or change after shower ?o May shower with wound dressing protected with water repellent cover or cast protector. ?o No tub bath. ?Anesthetic (Use 'Patient Medications' Section for Anesthetic Order Entry) ?o Lidocaine applied to wound  bed ?Edema Control - Lymphedema / Segmental Compressive Device / Other ?o Optional: One layer of unna paste to top of compression wrap (to act as an anchor). ?o Elevate legs to the level of the heart and pump ankles as often as possible ?Additional Orders / Instructions ?o Follow Nutritious Diet and Increase Protein Intake ?Wound Treatment ?Wound #1 - Lower Leg Wound Laterality: Right, Lateral ?Cleanser: Wound Cleanser 1 x Per Week/15 Days ?Discharge Instructions: Wash your hands with soap and water. Remove old dressing, discard into plastic bag and place into trash. ?Cleanse the wound with Wound Cleanser prior to applying a clean dressing using gauze sponges, not tissues or cotton balls. Do not ?scrub or use excessive force. Pat dry using gauze sponges, not tissue or cotton balls. ?Topical: Gentamicin 1 x Per Week/15 Days ?Discharge Instructions: In office only-Apply as directed by provider. ?Primary Dressing: Hydrofera Blue Ready Transfer Foam, 2.5x2.5 (in/in) 1 x Per Week/15 Days ?Discharge Instructions: Apply Hydrofera Blue Ready to wound bed as directed ?Secondary Dressing: ABD Pad 5x9 (in/in) 1 x Per Week/15 Days ?Discharge Instructions: Cover with ABD pad ?Secured With: Medipore Tape - 32M Medipore H Soft Cloth Surgical Tape, 2x2 (in/yd) 1 x Per Week/15 Days ?Secured With: Coban Cohesive Bandage 4x5 (yds) Stretched 1 x Per Week/15 Days ?Discharge Instructions: layer 2-Apply Coban as directed. ?Secured With: [employer] or Non-Sterile 6-ply 4.5x4 (yd/yd) 1 x Per Week/15 Days ?Discharge Instructions: layer 1-Apply Kerlix as directed ?Electronic Signature(s) ?Signed: 03/15/2022 12:05:24 PM By: Kalman Shan DO ?Entered By: Kalman Shan on 03/15/2022 12:04:50 ?[name], [name] ([id number]) ?-------------------------------------------------------------------------------- ?Problem List Details ?Patient Name: [name], [name] ?Date of Service: 03/15/2022 11:30 AM ?Medical Record Number:  [id number] ?Patient Account Number: [id number] ?Date of Birth/Sex: 1986/05/09 (36 y.o. F) ?Treating RN: Donnamarie Poag ?Primary Care Provider: Maryruth Hancock Other Clinician: ?Referring Provider: Maryruth Hancock ?Treating Provider/Ext

## 2022-03-15 NOTE — Progress Notes (Signed)
Lerch, Dystany ([id number]) ?Visit Report for 03/15/2022 ?Arrival Information Details ?Patient Name: [name], [name] ?Date of Service: 03/15/2022 11:30 AM ?Medical Record Number: [id number] ?Patient Account Number: [id number] ?Date of Birth/Sex: 01-Dec-1986 (35 y.o. F) ?Treating RN: Donnamarie Poag ?Primary Care Andrus Sharp: Maryruth Hancock Other Clinician: ?Referring Greysen Devino: Maryruth Hancock ?Treating Itzayanna Kaster/Extender: Kalman Shan ?Weeks in Treatment: 2 ?Visit Information History Since Last Visit ?Added or deleted any medications: No ?Patient Arrived: [name] ?Had a fall or experienced change in No ?Arrival Time: 11:05 ?activities of daily living that may affect ?Accompanied By: self ?risk of falls: ?Transfer Assistance: None ?Hospitalized since last visit: No ?Patient Identification Verified: Yes ?Has Dressing in Place as Prescribed: Yes ?Secondary Verification Process Completed: Yes ?Has Compression in Place as Prescribed: Yes ?Patient Requires Transmission-Based Precautions: No ?Pain Present Now: No ?Patient Has Alerts: Yes ?Electronic Signature(s) ?Signed: 03/15/2022 4:03:21 PM By: Donnamarie Poag ?Entered ByDonnamarie Poag on 03/15/2022 11:08:04 ?Emanuele, Jamira ([id number]) ?-------------------------------------------------------------------------------- ?Encounter Discharge Information Details ?Patient Name: [name], [name] ?Date of Service: 03/15/2022 11:30 AM ?Medical Record Number: [id number] ?Patient Account Number: [id number] ?Date of Birth/Sex: February 18, 1986 (36 y.o. F) ?Treating RN: Donnamarie Poag ?Primary Care Devaney Segers: Maryruth Hancock Other Clinician: ?Referring Shavon Ashmore: Maryruth Hancock ?Treating Heber Hoog/Extender: Kalman Shan ?Weeks in Treatment: 2 ?Encounter Discharge Information Items Post Procedure Vitals ?Discharge Condition: Stable ?Temperature (?F): 99.2 ?Ambulatory Status: [name] ?Pulse (bpm): 99 ?Discharge Destination: Home ?Respiratory Rate (breaths/min): 16 ?Transportation: Private Auto ?Blood Pressure (mmHg):  155/100 ?Accompanied By: self ?Schedule Follow-up Appointment: Yes ?Clinical Summary of Care: ?Electronic Signature(s) ?Signed: 03/15/2022 4:03:21 PM By: Donnamarie Poag ?Entered ByDonnamarie Poag on 03/15/2022 11:36:11 ?Avitabile, Deb ([id number]) ?-------------------------------------------------------------------------------- ?Lower Extremity Assessment Details ?Patient Name: [name], [name] ?Date of Service: 03/15/2022 11:30 AM ?Medical Record Number: [id number] ?Patient Account Number: [id number] ?Date of Birth/Sex: 24-Jul-1986 (36 y.o. F) ?Treating RN: Donnamarie Poag ?Primary Care Emillio Ngo: Maryruth Hancock Other Clinician: ?Referring Phi Avans: Maryruth Hancock ?Treating Elster Corbello/Extender: Kalman Shan ?Weeks in Treatment: 2 ?Edema Assessment ?Assessed: [Left: No] [Right: Yes] ?[Left: Edema] [Right: :] ?Calf ?Left: Right: ?Point of Measurement: 35 cm From Medial Instep 37 cm ?Ankle ?Left: Right: ?Point of Measurement: 11 cm From Medial Instep 24 cm ?Knee To Floor ?Left: Right: ?From Medial Instep 43 cm ?Vascular Assessment ?Pulses: ?Dorsalis Pedis ?Palpable: [Right:Yes] ?Electronic Signature(s) ?Signed: 03/15/2022 4:03:21 PM By: Donnamarie Poag ?Entered ByDonnamarie Poag on 03/15/2022 11:15:54 ?Ellerby, Mailani ([id number]) ?-------------------------------------------------------------------------------- ?Multi Wound Chart Details ?Patient Name: [name], [name] ?Date of Service: 03/15/2022 11:30 AM ?Medical Record Number: [id number] ?Patient Account Number: [id number] ?Date of Birth/Sex: Sep 30, 1986 (36 y.o. F) ?Treating RN: Donnamarie Poag ?Primary Care Eulamae Greenstein: Maryruth Hancock Other Clinician: ?Referring Loralye Loberg: Maryruth Hancock ?Treating Tyrah Broers/Extender: Kalman Shan ?Weeks in Treatment: 2 ?Vital Signs ?Height(in): 61 ?Pulse(bpm): 99 ?Weight(lbs): 200 ?Blood Pressure(mmHg): 155/102 ?Body Mass Index(BMI): 37.8 ?Temperature(??F): 99.2 ?Respiratory Rate(breaths/min): 16 ?Photos: [N/A:N/A] ?Wound Location: Right, Lateral Lower Leg N/A N/A ?Wounding  Event: Bump N/A N/A ?Primary Etiology: Pressure Ulcer N/A N/A ?Secondary Etiology: Infection - not elsewhere classified N/A N/A ?Comorbid History: Hypertension, Type II Diabetes, N/A N/A ?Osteoarthritis, Neuropathy, ?Paraplegia ?Date Acquired: 01/07/2022 N/A N/A ?Weeks of Treatment: 2 N/A N/A ?Wound Status: Open N/A N/A ?Wound Recurrence: No N/A N/A ?Measurements L x W x D (cm) 0.5x0.5x0.3 N/A N/A ?Area (cm?) : 0.196 N/A N/A ?Volume (cm?) : 0.059 N/A N/A ?% Reduction in Area: 80.80% N/A N/A ?% Reduction in Volume: 88.50% N/A N/A ?Classification: Category/Stage III N/A N/A ?Exudate Amount: Large N/A N/A ?Exudate Type: Serosanguineous N/A N/A ?Exudate Color: red, brown N/A N/A ?Granulation Amount: Large (67-100%)  N/A N/A ?Granulation Quality: Red, Hyper-granulation N/A N/A ?Necrotic Amount: Small (1-33%) N/A N/A ?Exposed Structures: ?Fat Layer (Subcutaneous Tissue): N/A N/A ?Yes ?Fascia: No ?Tendon: No ?Muscle: No ?Joint: No ?Bone: No ?Treatment Notes ?Electronic Signature(s) ?Signed: 03/15/2022 4:03:21 PM By: Donnamarie Poag ?Entered ByDonnamarie Poag on 03/15/2022 11:16:17 ?Levinson, Louretta ([id number]) ?-------------------------------------------------------------------------------- ?Multi-Disciplinary Care Plan Details ?Patient Name: [name], [name] ?Date of Service: 03/15/2022 11:30 AM ?Medical Record Number: [id number] ?Patient Account Number: [id number] ?Date of Birth/Sex: [birth year] (36 y.o. F) ?Treating RN: Donnamarie Poag ?Primary Care Brenn Deziel: Maryruth Hancock Other Clinician: ?Referring Chad Tiznado: Maryruth Hancock ?Treating Ryn Peine/Extender: Kalman Shan ?Weeks in Treatment: 2 ?Active Inactive ?Wound/Skin Impairment ?Nursing Diagnoses: ?Impaired tissue integrity ?Knowledge deficit related to smoking impact on wound healing ?Knowledge deficit related to ulceration/compromised skin integrity ?Goals: ?Patient/caregiver will verbalize understanding of skin care regimen ?Date Initiated: 03/01/2022 ?Date Inactivated: 03/08/2022 ?Target  Resolution Date: 03/17/2022 ?Goal Status: Met ?Ulcer/skin breakdown will have a volume reduction of 30% by week 4 ?Date Initiated: 03/01/2022 ?Date Inactivated: 03/15/2022 ?Target Resolution Date: 03/29/2022 ?Goal Status: Met ?Ulcer/skin breakdown will have a volume reduction of 50% by week 8 ?Date Initiated: 03/01/2022 ?Target Resolution Date: 04/26/2022 ?Goal Status: Active ?Ulcer/skin breakdown will have a volume reduction of 80% by week 12 ?Date Initiated: 03/01/2022 ?Target Resolution Date: 05/24/2022 ?Goal Status: Active ?Ulcer/skin breakdown will heal within 14 weeks ?Date Initiated: 03/01/2022 ?Target Resolution Date: 06/07/2022 ?Goal Status: Active ?Interventions: ?Assess patient/caregiver ability to obtain necessary supplies ?Assess patient/caregiver ability to perform ulcer/skin care regimen upon admission and as needed ?Assess ulceration(s) every visit ?Notes: ?Electronic Signature(s) ?Signed: 03/15/2022 4:03:21 PM By: Donnamarie Poag ?Entered ByDonnamarie Poag on 03/15/2022 11:16:08 ?Sockwell, Maris ([id number]) ?-------------------------------------------------------------------------------- ?Pain Assessment Details ?Patient Name: [name], [name] ?Date of Service: 03/15/2022 11:30 AM ?Medical Record Number: [id number] ?Patient Account Number: [id number] ?Date of Birth/Sex: September 23, 1986 (36 y.o. F) ?Treating RN: Donnamarie Poag ?Primary Care Cale Bethard: Maryruth Hancock Other Clinician: ?Referring Phil Corti: Maryruth Hancock ?Treating Keontae Levingston/Extender: Kalman Shan ?Weeks in Treatment: 2 ?Active Problems ?Location of Pain Severity and Description of Pain ?Patient Has Paino No ?Site Locations ?Rate the pain. ?Current Pain Level: 0 ?Pain Management and Medication ?Current Pain Management: ?Electronic Signature(s) ?Signed: 03/15/2022 4:03:21 PM By: Donnamarie Poag ?Entered ByDonnamarie Poag on 03/15/2022 11:09:44 ?Bardales, Maeola ([id number]) ?-------------------------------------------------------------------------------- ?Patient/Caregiver Education  Details ?Patient Name: [name], [name] ?Date of Service: 03/15/2022 11:30 AM ?Medical Record Number: [id number] ?Patient Account Number: [id number] ?Date of Birth/Gender: 1986-01-01 (36 y.o. F) ?Treating RN: Donnamarie Poag ?Primar

## 2022-03-22 ENCOUNTER — Encounter (HOSPITAL_BASED_OUTPATIENT_CLINIC_OR_DEPARTMENT_OTHER): Payer: Medicaid Other | Admitting: Internal Medicine

## 2022-03-22 ENCOUNTER — Other Ambulatory Visit: Payer: Self-pay

## 2022-03-22 DIAGNOSIS — I87311 Chronic venous hypertension (idiopathic) with ulcer of right lower extremity: Secondary | ICD-10-CM | POA: Diagnosis not present

## 2022-03-22 DIAGNOSIS — L97812 Non-pressure chronic ulcer of other part of right lower leg with fat layer exposed: Secondary | ICD-10-CM | POA: Diagnosis not present

## 2022-03-22 NOTE — Progress Notes (Signed)
Wing, Keidy ([id number]) ?Visit Report for 03/22/2022 ?Arrival Information Details ?Patient Name: [name], [name] ?Date of Service: 03/22/2022 11:30 AM ?Medical Record Number: [id number] ?Patient Account Number: [id number] ?Date of Birth/Sex: 1986/09/30 (36 y.o. F) ?Treating RN: Donnamarie Poag ?Primary Care Tunis Gentle: Maryruth Hancock Other Clinician: ?Referring Esbeydi Manago: Maryruth Hancock ?Treating Sehaj Mcenroe/Extender: Kalman Shan ?Weeks in Treatment: 3 ?Visit Information History Since Last Visit ?Added or deleted any medications: No ?Patient Arrived: Kasandra Knudsen ?Had a fall or experienced change in No ?Arrival Time: 10:54 ?activities of daily living that may affect ?Accompanied By: self ?risk of falls: ?Transfer Assistance: None ?Hospitalized since last visit: No ?Patient Identification Verified: Yes ?Has Dressing in Place as Prescribed: Yes ?Secondary Verification Process Completed: Yes ?Has Compression in Place as Prescribed: Yes ?Patient Requires Transmission-Based Precautions: No ?Pain Present Now: Yes ?Patient Has Alerts: Yes ?Patient Alerts: DIABETIC ?Electronic Signature(s) ?Signed: 03/22/2022 3:52:05 PM By: Donnamarie Poag ?Entered ByDonnamarie Poag on 03/22/2022 10:55:16 ?Clenney, Gurneet ([id number]) ?-------------------------------------------------------------------------------- ?Encounter Discharge Information Details ?Patient Name: [name], [name] ?Date of Service: 03/22/2022 11:30 AM ?Medical Record Number: [id number] ?Patient Account Number: [id number] ?Date of Birth/Sex: 02-26-1986 (36 y.o. F) ?Treating RN: Donnamarie Poag ?Primary Care Margerite Impastato: Maryruth Hancock Other Clinician: ?Referring Tennille Montelongo: Maryruth Hancock ?Treating Kayliah Tindol/Extender: Kalman Shan ?Weeks in Treatment: 3 ?Encounter Discharge Information Items Post Procedure Vitals ?Discharge Condition: Stable ?Temperature (?F): 98.7 ?Ambulatory Status: Kasandra Knudsen ?Pulse (bpm): 92 ?Discharge Destination: Home ?Respiratory Rate (breaths/min): 16 ?Transportation: Private Auto ?Blood Pressure  (mmHg): 149/92 ?Accompanied By: self ?Schedule Follow-up Appointment: Yes ?Clinical Summary of Care: ?Electronic Signature(s) ?Signed: 03/22/2022 3:52:05 PM By: Donnamarie Poag ?Entered ByDonnamarie Poag on 03/22/2022 11:38:43 ?Bastyr, Kema ([id number]) ?-------------------------------------------------------------------------------- ?Lower Extremity Assessment Details ?Patient Name: [name], [name] ?Date of Service: 03/22/2022 11:30 AM ?Medical Record Number: [id number] ?Patient Account Number: [id number] ?Date of Birth/Sex: 11-Nov-1986 (36 y.o. F) ?Treating RN: Donnamarie Poag ?Primary Care Marvell Stavola: Maryruth Hancock Other Clinician: ?Referring Belen Zwahlen: Maryruth Hancock ?Treating Suprena Travaglini/Extender: Kalman Shan ?Weeks in Treatment: 3 ?Edema Assessment ?Assessed: [Left: No] [Right: Yes] ?[Left: Edema] [Right: :] ?Calf ?Left: Right: ?Point of Measurement: 35 cm From Medial Instep 37.5 cm ?Ankle ?Left: Right: ?Point of Measurement: 11 cm From Medial Instep 22.5 cm ?Knee To Floor ?Left: Right: ?From Medial Instep 43 cm ?Electronic Signature(s) ?Signed: 03/22/2022 3:52:05 PM By: Donnamarie Poag ?Entered ByDonnamarie Poag on 03/22/2022 11:07:21 ?Mangrum, Corinthia ([id number]) ?-------------------------------------------------------------------------------- ?Multi Wound Chart Details ?Patient Name: [name], [name] ?Date of Service: 03/22/2022 11:30 AM ?Medical Record Number: [id number] ?Patient Account Number: [id number] ?Date of Birth/Sex: 1986-09-22 (35 y.o. F) ?Treating RN: Donnamarie Poag ?Primary Care Deysha Cartier: Maryruth Hancock Other Clinician: ?Referring Yaileen Hofferber: Maryruth Hancock ?Treating Jessica Seidman/Extender: Kalman Shan ?Weeks in Treatment: 3 ?Vital Signs ?Height(in): 61 ?Pulse(bpm): 92 ?Weight(lbs): 200 ?Blood Pressure(mmHg): 149/92 ?Body Mass Index(BMI): 37.8 ?Temperature(??F): 98.7 ?Respiratory Rate(breaths/min): 16 ?Photos: [N/A:N/A] ?Wound Location: Right, Lateral Lower Leg N/A N/A ?Wounding Event: Bump N/A N/A ?Primary Etiology: Pressure Ulcer N/A  N/A ?Secondary Etiology: Infection - not elsewhere classified N/A N/A ?Comorbid History: Hypertension, Type II Diabetes, N/A N/A ?Osteoarthritis, Neuropathy, ?Paraplegia ?Date Acquired: 01/07/2022 N/A N/A ?Weeks of Treatment: 3 N/A N/A ?Wound Status: Open N/A N/A ?Wound Recurrence: No N/A N/A ?Measurements L x W x D (cm) 0.4x0.5x0.3 N/A N/A ?Area (cm?) : 0.157 N/A N/A ?Volume (cm?) : 0.047 N/A N/A ?% Reduction in Area: 84.60% N/A N/A ?% Reduction in Volume: 90.80% N/A N/A ?Classification: Category/Stage III N/A N/A ?Exudate Amount: Large N/A N/A ?Exudate Type: Serosanguineous N/A N/A ?Exudate Color: red, brown N/A N/A ?Granulation Amount: Large (67-100%) N/A N/A ?Granulation Quality:  Red, Hyper-granulation N/A N/A ?Necrotic Amount: Small (1-33%) N/A N/A ?Exposed Structures: ?Fat Layer (Subcutaneous Tissue): N/A N/A ?Yes ?Fascia: No ?Tendon: No ?Muscle: No ?Joint: No ?Bone: No ?Treatment Notes ?Electronic Signature(s) ?Signed: 03/22/2022 3:52:05 PM By: Donnamarie Poag ?Entered ByDonnamarie Poag on 03/22/2022 11:26:35 ?Teschner, Khaleelah ([id number]) ?-------------------------------------------------------------------------------- ?Multi-Disciplinary Care Plan Details ?Patient Name: [name], [name] ?Date of Service: 03/22/2022 11:30 AM ?Medical Record Number: [id number] ?Patient Account Number: [id number] ?Date of Birth/Sex: 1986/04/07 (36 y.o. F) ?Treating RN: Donnamarie Poag ?Primary Care Corley Maffeo: Maryruth Hancock Other Clinician: ?Referring Sophronia Varney: Maryruth Hancock ?Treating Dacian Orrico/Extender: Kalman Shan ?Weeks in Treatment: 3 ?Active Inactive ?Wound/Skin Impairment ?Nursing Diagnoses: ?Impaired tissue integrity ?Knowledge deficit related to smoking impact on wound healing ?Knowledge deficit related to ulceration/compromised skin integrity ?Goals: ?Patient/caregiver will verbalize understanding of skin care regimen ?Date Initiated: 03/01/2022 ?Date Inactivated: 03/08/2022 ?Target Resolution Date: 03/17/2022 ?Goal Status: Met ?Ulcer/skin  breakdown will have a volume reduction of 30% by week 4 ?Date Initiated: 03/01/2022 ?Date Inactivated: 03/15/2022 ?Target Resolution Date: 03/29/2022 ?Goal Status: Met ?Ulcer/skin breakdown will have a volume reduction of 50% by week 8 ?Date Initiated: 03/01/2022 ?Target Resolution Date: 04/26/2022 ?Goal Status: Active ?Ulcer/skin breakdown will have a volume reduction of 80% by week 12 ?Date Initiated: 03/01/2022 ?Target Resolution Date: 05/24/2022 ?Goal Status: Active ?Ulcer/skin breakdown will heal within 14 weeks ?Date Initiated: 03/01/2022 ?Target Resolution Date: 06/07/2022 ?Goal Status: Active ?Interventions: ?Assess patient/caregiver ability to obtain necessary supplies ?Assess patient/caregiver ability to perform ulcer/skin care regimen upon admission and as needed ?Assess ulceration(s) every visit ?Notes: ?Electronic Signature(s) ?Signed: 03/22/2022 3:52:05 PM By: Donnamarie Poag ?Entered ByDonnamarie Poag on 03/22/2022 11:26:23 ?Carfagno, Jamesetta ([id number]) ?-------------------------------------------------------------------------------- ?Pain Assessment Details ?Patient Name: [name], [name] ?Date of Service: 03/22/2022 11:30 AM ?Medical Record Number: [id number] ?Patient Account Number: [id number] ?Date of Birth/Sex: 26-May-1986 (36 y.o. F) ?Treating RN: Donnamarie Poag ?Primary Care [name] Hommel: Maryruth Hancock Other Clinician: ?Referring Vanette Noguchi: Maryruth Hancock ?Treating Tahir Blank/Extender: Kalman Shan ?Weeks in Treatment: 3 ?Active Problems ?Location of Pain Severity and Description of Pain ?Patient Has Paino Yes ?Site Locations ?Pain Location: ?Generalized Pain ?Rate the pain. ?Current Pain Level: 5 ?Pain Management and Medication ?Current Pain Management: ?Notes ?c/o headache; pressure at work ?Electronic Signature(s) ?Signed: 03/22/2022 3:52:05 PM By: Donnamarie Poag ?Entered ByDonnamarie Poag on 03/22/2022 10:58:04 ?Croslin, Dareth  ([id number]) ?-------------------------------------------------------------------------------- ?Patient/Caregiver Education Details ?Patient Name: [name], [name] ?Date of Service: 03/22/2022 11:30 AM ?Medical Record Number: [id number] ?Patient Account Number: [id number] ?Date of Birth/Gender: 23-Jul-1986 (36 y.o. F)

## 2022-03-22 NOTE — Progress Notes (Signed)
Alarie, Katerra ([id number]) ?Visit Report for 03/22/2022 ?Chief Complaint Document Details ?Patient Name: [name], [name] ?Date of Service: 03/22/2022 11:30 AM ?Medical Record Number: [id number] ?Patient Account Number: [id number] ?Date of Birth/Sex: 1986/08/15 (36 y.o. F) ?Treating RN: [name] ?Primary Care Provider: Maryruth [name] Other Clinician: ?Referring Provider: Maryruth [name] ?Treating Provider/Extender: [name] ?Weeks in Treatment: 3 ?Information Obtained from: Patient ?Chief Complaint ?Right lower extremity wound ?Electronic Signature(s) ?Signed: 03/22/2022 11:36:10 AM By: [name] ?Entered By: [name] on 03/22/2022 11:33:21 ?[name], [name] ([id number]) ?-------------------------------------------------------------------------------- ?Debridement Details ?Patient Name: [name], [name] ?Date of Service: 03/22/2022 11:30 AM ?Medical Record Number: [id number] ?Patient Account Number: [id number] ?Date of Birth/Sex: 06-Mar-1986 (36 y.o. F) ?Treating RN: [name] ?Primary Care Provider: Maryruth [name] Other Clinician: ?Referring Provider: Maryruth [name] ?Treating Provider/Extender: [name] ?Weeks in Treatment: 3 ?Debridement Performed for ?Wound #1 Right,Lateral Lower Leg ?Assessment: ?Performed By: Physician [name] Shan, MD ?Debridement Type: Debridement ?Level of Consciousness (Pre- ?Awake and Alert ?procedure): ?Pre-procedure Verification/Time Out ?Yes - 11:25 ?Taken: ?Start Time: 11:26 ?Pain Control: Lidocaine ?Total Area Debrided (L x W): 0.4 (cm) x 0.5 (cm) = 0.2 (cm?) ?Tissue and other material ?Non-Viable, [name], Biofilm, [name] ?debrided: ?Level: Non-Viable Tissue ?Debridement Description: Selective/Open Wound ?Instrument: Curette ?Bleeding: Minimum ?Hemostasis Achieved: Pressure ?Response to Treatment: Procedure was tolerated well ?Level of Consciousness (Post- ?Awake and Alert ?procedure): ?Post Debridement Measurements of Total Wound ?Length: (cm) 0.4 ?Stage: Category/Stage  III ?Width: (cm) 0.5 ?Depth: (cm) 0.3 ?Volume: (cm?) 0.047 ?Character of Wound/Ulcer Post Debridement: Improved ?Post Procedure Diagnosis ?Same as Pre-procedure ?Electronic Signature(s) ?Signed: 03/22/2022 11:36:10 AM By: [name] ?Signed: 03/22/2022 3:52:05 PM By: [name] ?Entered ByDonnamarie [name] on 03/22/2022 11:27:27 ?[name], [name] ([id number]) ?-------------------------------------------------------------------------------- ?HPI Details ?Patient Name: [name], [name] ?Date of Service: 03/22/2022 11:30 AM ?Medical Record Number: [id number] ?Patient Account Number: [id number] ?Date of Birth/Sex: 02-Nov-1986 (36 y.o. F) ?Treating RN: [name] ?Primary Care Provider: Maryruth [name] Other Clinician: ?Referring Provider: Maryruth [name] ?Treating Provider/Extender: [name] ?Weeks in Treatment: 3 ?History of Present Illness ?HPI Description: Admission 03/01/2022 ?Ms. [name] is a 36 year old female with a past medical history of cerebral palsy with leg braces, spina bifida and myasthenia gravis that ?presents to the clinic for a 23-month history of nonhealing ulcer to the right lower extremity. She states that the brace was rubbing up against her ?leg causing a blister and subsequently developed a wound. She has stopped wearing the brace since her wound developed. She originally saw ?urgent care on 01/07/2022 for this issue. They prescribed doxycycline at that time. She has been keeping the wound open to air. She covers it ?when she goes outside. She recently saw her podiatrist, Dr. Vickki [name] on 02/16/2022 and he referred her to our clinic. He also prescribed her ?doxycycline. She currently denies signs of infection. ?3/15; patient presents for follow-up. She has been using Hydrofera Blue dressing without issues. She denies signs of infection. ?3/22; patient presents for follow-up. She tolerated the compression wrap well. She has no issues or complaints today. She brought in paperwork ?to be filled out  for an accommodation request for work. ?3/29; patient presents for follow-up. She continues to tolerate the compression wrap well. She has no issues or complaints today. ?Electronic Signature(s) ?Signed: 03/22/2022 11:36:10 AM By: [name] ?Entered By: [name] on 03/22/2022 11:33:42 ?[name], [name] ([id number]) ?-------------------------------------------------------------------------------- ?Physical Exam Details ?Patient Name: [name], [name] ?Date of Service: 03/22/2022 11:30 AM ?Medical Record Number: [id number] ?Patient Account Number: [id number] ?Date of Birth/Sex: 02-21-1986 (36 y.o. F) ?  Treating RN: [name] ?Primary Care Provider: Maryruth [name] Other Clinician: ?Referring Provider: Maryruth [name] ?Treating Provider/Extender: [name] ?Weeks in Treatment: 3 ?Constitutional ?. ?Cardiovascular ?Marland Kitchen ?Psychiatric ?Marland Kitchen ?Notes ?Right lower extremity: Open wound with granulation tissue and biofilm with devitalized tissue circumferentially. No surrounding signs of infection. ?Electronic Signature(s) ?Signed: 03/22/2022 11:36:10 AM By: [name] ?Entered By: [name] on 03/22/2022 11:34:20 ?[name], [name] ([id number]) ?-------------------------------------------------------------------------------- ?Physician Orders Details ?Patient Name: [name], [name] ?Date of Service: 03/22/2022 11:30 AM ?Medical Record Number: [id number] ?Patient Account Number: [id number] ?Date of Birth/Sex: 24-May-1986 (36 y.o. F) ?Treating RN: [name] ?Primary Care Provider: Maryruth [name] Other Clinician: ?Referring Provider: Maryruth [name] ?Treating Provider/Extender: [name] ?Weeks in Treatment: 3 ?Verbal / Phone Orders: No ?Diagnosis Coding ?Follow-up Appointments ?o Return Appointment in 1 week. ?o Nurse Visit as needed ?Bathing/ Shower/ Hygiene ?o Clean wound with Normal Saline or wound cleanser. - keep dressing dry or change after shower ?o May shower with wound dressing protected with water  repellent cover or cast protector. ?o No tub bath. ?Anesthetic (Use 'Patient Medications' Section for Anesthetic Order Entry) ?o Lidocaine applied to wound bed ?Edema Control - Lymphedema / Segmental Compressive Device / Other ?o Optional: One layer of unna paste to top of compression wrap (to act as an anchor). ?o Elevate legs to the level of the heart and pump ankles as often as possible ?Additional Orders / Instructions ?o Follow Nutritious Diet and Increase Protein Intake ?Wound Treatment ?Wound #1 - Lower Leg Wound Laterality: Right, Lateral ?Cleanser: Wound Cleanser 1 x Per Week/15 Days ?Discharge Instructions: Wash your hands with soap and water. Remove old dressing, discard into plastic bag and place into trash. ?Cleanse the wound with Wound Cleanser prior to applying a clean dressing using gauze sponges, not tissues or cotton balls. [name] not ?scrub or use excessive force. Pat dry using gauze sponges, not tissue or cotton balls. ?Topical: Gentamicin 1 x Per Week/15 Days ?Discharge Instructions: In office only-Apply as directed by provider. ?Primary Dressing: Hydrofera Blue Ready Transfer Foam, 2.5x2.5 (in/in) 1 x Per Week/15 Days ?Discharge Instructions: Apply Hydrofera Blue Ready to wound bed as directed ?Secondary Dressing: ABD Pad 5x9 (in/in) 1 x Per Week/15 Days ?Discharge Instructions: Cover with ABD pad ?Secured With: Medipore Tape - 63M Medipore H Soft Cloth Surgical Tape, 2x2 (in/yd) 1 x Per Week/15 Days ?Secured With: Coban Cohesive Bandage 4x5 (yds) Stretched 1 x Per Week/15 Days ?Discharge Instructions: layer 2-Apply Coban as directed. ?Secured With: [employer] or Non-Sterile 6-ply 4.5x4 (yd/yd) 1 x Per Week/15 Days ?Discharge Instructions: layer 1-Apply Kerlix as directed ?Electronic Signature(s) ?Signed: 03/22/2022 11:36:10 AM By: [name] ?Entered By: [name] on 03/22/2022 11:35:40 ?[name], [name]  ([id number]) ?-------------------------------------------------------------------------------- ?Problem List Details ?Patient Name: [name], [name] ?Date of Service: 03/22/2022 11:30 AM ?Medical Record Number: [id number] ?Patient Account Number: [id number] ?Date of

## 2022-03-29 ENCOUNTER — Encounter: Payer: Medicaid Other | Attending: Internal Medicine | Admitting: Internal Medicine

## 2022-03-29 DIAGNOSIS — G809 Cerebral palsy, unspecified: Secondary | ICD-10-CM | POA: Insufficient documentation

## 2022-03-29 DIAGNOSIS — L97812 Non-pressure chronic ulcer of other part of right lower leg with fat layer exposed: Secondary | ICD-10-CM | POA: Insufficient documentation

## 2022-03-29 DIAGNOSIS — I87311 Chronic venous hypertension (idiopathic) with ulcer of right lower extremity: Secondary | ICD-10-CM | POA: Diagnosis not present

## 2022-03-29 DIAGNOSIS — Q059 Spina bifida, unspecified: Secondary | ICD-10-CM | POA: Diagnosis not present

## 2022-03-29 DIAGNOSIS — G808 Other cerebral palsy: Secondary | ICD-10-CM | POA: Diagnosis not present

## 2022-03-29 DIAGNOSIS — G7000 Myasthenia gravis without (acute) exacerbation: Secondary | ICD-10-CM | POA: Diagnosis not present

## 2022-03-29 NOTE — Progress Notes (Signed)
Truby, Catarina ([id number]) ?Visit Report for 03/29/2022 ?Arrival Information Details ?Patient Name: [name], [name] ?Date of Service: 03/29/2022 11:30 AM ?Medical Record Number: [id number] ?Patient Account Number: [id number] ?Date of Birth/Sex: [birth year] (36 y.o. F) ?Treating RN: Donnamarie Poag ?Primary Care Tanette Chauca: Maryruth Hancock Other Clinician: ?Referring Harinder Romas: Maryruth Hancock ?Treating Mackenna Kamer/Extender: Kalman Shan ?Weeks in Treatment: 4 ?Visit Information History Since Last Visit ?Added or deleted any medications: No ?Patient Arrived: Kasandra Knudsen ?Had a fall or experienced change in No ?Arrival Time: 11:27 ?activities of daily living that may affect ?Accompanied By: self ?risk of falls: ?Transfer Assistance: None ?Hospitalized since last visit: No ?Patient Identification Verified: Yes ?Has Dressing in Place as Prescribed: Yes ?Secondary Verification Process Completed: Yes ?Has Compression in Place as Prescribed: Yes ?Patient Requires Transmission-Based Precautions: No ?Pain Present Now: No ?Patient Has Alerts: Yes ?Patient Alerts: DIABETIC ?Electronic Signature(s) ?Signed: 03/29/2022 12:06:15 PM By: Donnamarie Poag ?Entered ByDonnamarie Poag on 03/29/2022 11:28:13 ?Donnellan, Francille ([id number]) ?-------------------------------------------------------------------------------- ?Clinic Level of Care Assessment Details ?Patient Name: [name] ?Date of Service: 03/29/2022 11:30 AM ?Medical Record Number: [id number] ?Patient Account Number: [id number] ?Date of Birth/Sex: 24-May-1986 (36 y.o. F) ?Treating RN: Donnamarie Poag ?Primary Care Stellan Vick: Maryruth Hancock Other Clinician: ?Referring Jayke Caul: Maryruth Hancock ?Treating Burgess Sheriff/Extender: Kalman Shan ?Weeks in Treatment: 4 ?Clinic Level of Care Assessment Items ?TOOL 4 Quantity Score ?[]  - Use when only an EandM is performed on FOLLOW-UP visit 0 ?ASSESSMENTS - Nursing Assessment / Reassessment ?[]  - Reassessment of Co-morbidities (includes updates in patient status) 0 ?[]  -  0 ?Reassessment of Adherence to Treatment Plan ?ASSESSMENTS - Wound and Skin Assessment / Reassessment ?X - Simple Wound Assessment / Reassessment - one wound 1 5 ?[]  - 0 ?Complex Wound Assessment / Reassessment - multiple wounds ?[]  - 0 ?Dermatologic / Skin Assessment (not related to wound area) ?ASSESSMENTS - Focused Assessment ?[]  - Circumferential Edema Measurements - multi extremities 0 ?[]  - 0 ?Nutritional Assessment / Counseling / Intervention ?[]  - 0 ?Lower Extremity Assessment (monofilament, tuning fork, pulses) ?[]  - 0 ?Peripheral Arterial Disease Assessment (using hand held doppler) ?ASSESSMENTS - Ostomy and/or Continence Assessment and Care ?[]  - Incontinence Assessment and Management 0 ?[]  - 0 ?Ostomy Care Assessment and Management (repouching, etc.) ?PROCESS - Coordination of Care ?X - Simple Patient / Family Education for ongoing care 1 15 ?[]  - 0 ?Complex (extensive) Patient / Family Education for ongoing care ?X- 1 10 ?Staff obtains Consents, Records, Test Results / Process Orders ?[]  - 0 ?Staff telephones HHA, Nursing Homes / Clarify orders / etc ?[]  - 0 ?Routine Transfer to another Facility (non-emergent condition) ?[]  - 0 ?Routine Hospital Admission (non-emergent condition) ?[]  - 0 ?New Admissions / Biomedical engineer / Ordering NPWT, Apligraf, etc. ?[]  - 0 ?Emergency Hospital Admission (emergent condition) ?X- 1 10 ?Simple Discharge Coordination ?[]  - 0 ?Complex (extensive) Discharge Coordination ?PROCESS - Special Needs ?[]  - Pediatric / Minor Patient Management 0 ?[]  - 0 ?Isolation Patient Management ?[]  - 0 ?Hearing / Language / Visual special needs ?[]  - 0 ?Assessment of Community assistance (transportation, D/C planning, etc.) ?[]  - 0 ?Additional assistance / Altered mentation ?[]  - 0 ?Support Surface(s) Assessment (bed, cushion, seat, etc.) ?INTERVENTIONS - Wound Cleansing / Measurement ?Swatek, Hollye ([id number]) ?X- 1 5 ?Simple Wound Cleansing - one wound ?[]  - 0 ?Complex Wound  Cleansing - multiple wounds ?X- 1 5 ?Wound Imaging (photographs - any number of wounds) ?[]  - 0 ?Wound Tracing (instead of photographs) ?X- 1 5 ?Simple Wound Measurement - one wound ?[]  -  0 ?Complex Wound Measurement - multiple wounds ?INTERVENTIONS - Wound Dressings ?X - Small Wound Dressing one or multiple wounds 1 10 ?[]  - 0 ?Medium Wound Dressing one or multiple wounds ?[]  - 0 ?Large Wound Dressing one or multiple wounds ?X- 1 5 ?Application of Medications - topical ?[]  - 0 ?Application of Medications - injection ?INTERVENTIONS - Miscellaneous ?[]  - External ear exam 0 ?[]  - 0 ?Specimen Collection (cultures, biopsies, blood, body fluids, etc.) ?[]  - 0 ?Specimen(s) / Culture(s) sent or taken to Lab for analysis ?[]  - 0 ?Patient Transfer (multiple staff / Civil Service fast streamer / Similar devices) ?[]  - 0 ?Simple Staple / Suture removal (25 or less) ?[]  - 0 ?Complex Staple / Suture removal (26 or more) ?[]  - 0 ?Hypo / Hyperglycemic Management (close monitor of Blood Glucose) ?[]  - 0 ?Ankle / Brachial Index (ABI) - do not check if billed separately ?X- 1 5 ?Vital Signs ?Has the patient been seen at the hospital within the last three years: Yes ?Total Score: 75 ?Level Of Care: New/Established - Level ?2 ?Electronic Signature(s) ?Signed: 03/29/2022 12:06:15 PM By: Donnamarie Poag ?Entered ByDonnamarie Poag on 03/29/2022 11:44:09 ?Holness, Jersie ([id number]) ?-------------------------------------------------------------------------------- ?Encounter Discharge Information Details ?Patient Name: [name] ?Date of Service: 03/29/2022 11:30 AM ?Medical Record Number: [id number] ?Patient Account Number: [id number] ?Date of Birth/Sex: Jun 16, 1986 (36 y.o. F) ?Treating RN: Donnamarie Poag ?Primary Care Renezmae Canlas: Maryruth Hancock Other Clinician: ?Referring Voshon Petro: Maryruth Hancock ?Treating Levone Otten/Extender: Kalman Shan ?Weeks in Treatment: 4 ?Encounter Discharge Information Items ?Discharge Condition: Stable ?Ambulatory Status: Kasandra Knudsen ?Discharge  Destination: Home ?Transportation: Private Auto ?Accompanied By: self ?Schedule Follow-up Appointment: Yes ?Clinical Summary of Care: ?Electronic Signature(s) ?Signed: 03/29/2022 12:06:15 PM By: Donnamarie Poag ?Entered ByDonnamarie Poag on 03/29/2022 11:45:32 ?Mcauliffe, Jaylie ([id number]) ?-------------------------------------------------------------------------------- ?Lower Extremity Assessment Details ?Patient Name: SHAINDEL, COULSON ?Date of Service: 03/29/2022 11:30 AM ?Medical Record Number: [id number] ?Patient Account Number: [id number] ?Date of Birth/Sex: December 01, 1986 (36 y.o. F) ?Treating RN: Donnamarie Poag ?Primary Care Salome Hautala: Maryruth Hancock Other Clinician: ?Referring Naria Abbey: Maryruth Hancock ?Treating Elliett Guarisco/Extender: Kalman Shan ?Weeks in Treatment: 4 ?Edema Assessment ?Assessed: [Left: No] [Right: Yes] ?[Left: Edema] [Right: :] ?Calf ?Left: Right: ?Point of Measurement: 35 cm From Medial Instep 40 cm ?Ankle ?Left: Right: ?Point of Measurement: 11 cm From Medial Instep 22.5 cm ?Knee To Floor ?Left: Right: ?From Medial Instep 41.5 cm ?Electronic Signature(s) ?Signed: 03/29/2022 12:06:15 PM By: Donnamarie Poag ?Entered ByDonnamarie Poag on 03/29/2022 11:38:26 ?Hinson, Dewey ([id number]) ?-------------------------------------------------------------------------------- ?Multi Wound Chart Details ?Patient Name: SIRINE, GLORIA ?Date of Service: 03/29/2022 11:30 AM ?Medical Record Number: [id number] ?Patient Account Number: [id number] ?Date of Birth/Sex: 06/25/1986 (36 y.o. F) ?Treating RN: Donnamarie Poag ?Primary Care Natayla Cadenhead: Maryruth Hancock Other Clinician: ?Referring Raelle Chambers: Maryruth Hancock ?Treating Omayra Tulloch/Extender: Kalman Shan ?Weeks in Treatment: 4 ?Vital Signs ?Height(in): 61 ?Pulse(bpm): 97 ?Weight(lbs): 200 ?Blood Pressure(mmHg): 145/92 ?Body Mass Index(BMI): 37.8 ?Temperature(??F): 98.3 ?Respiratory Rate(breaths/min): 16 ?Photos: [N/A:N/A] ?Wound Location: Right, Lateral Lower Leg N/A N/A ?Wounding Event: Bump N/A N/A ?Primary  Etiology: Pressure Ulcer N/A N/A ?Secondary Etiology: Infection - not elsewhere classified N/A N/A ?Comorbid History: Hypertension, Type II Diabetes, N/A N/A ?Osteoarthritis, Neuropathy, ?Paraplegia ?Date Acquired: 1/14/2

## 2022-03-29 NOTE — Progress Notes (Signed)
Sweezy, Renesmae ([id number]) ?Visit Report for 03/29/2022 ?Chief Complaint Document Details ?Patient Name: MINDI, AKERSON ?Date of Service: 03/29/2022 11:30 AM ?Medical Record Number: [id number] ?Patient Account Number: [id number] ?Date of Birth/Sex: 1986-01-04 (36 y.o. F) ?Treating RN: Hansel Feinstein ?Primary Care Provider: Hezzie Bump Other Clinician: ?Referring Provider: Hezzie Bump ?Treating Provider/Extender: Geralyn Corwin ?Weeks in Treatment: 4 ?Information Obtained from: Patient ?Chief Complaint ?Right lower extremity wound ?Electronic Signature(s) ?Signed: 03/29/2022 12:16:21 PM By: Geralyn Corwin DO ?Entered By: Geralyn Corwin on 03/29/2022 12:12:26 ?Woelfel, Arlicia ([id number]) ?-------------------------------------------------------------------------------- ?HPI Details ?Patient Name: KATALIN, COLLEDGE ?Date of Service: 03/29/2022 11:30 AM ?Medical Record Number: [id number] ?Patient Account Number: [id number] ?Date of Birth/Sex: 11/09/1986 (36 y.o. F) ?Treating RN: Hansel Feinstein ?Primary Care Provider: Hezzie Bump Other Clinician: ?Referring Provider: Hezzie Bump ?Treating Provider/Extender: Geralyn Corwin ?Weeks in Treatment: 4 ?History of Present Illness ?HPI Description: Admission 03/01/2022 ?Ms. Kerington Hildebrant is a 36 year old female with a past medical history of cerebral palsy with leg braces, spina bifida and myasthenia gravis that ?presents to the clinic for a 71-month history of nonhealing ulcer to the right lower extremity. She states that the brace was rubbing up against her ?leg causing a blister and subsequently developed a wound. She has stopped wearing the brace since her wound developed. She originally saw ?urgent care on 01/07/2022 for this issue. They prescribed doxycycline at that time. She has been keeping the wound open to air. She covers it ?when she goes outside. She recently saw her podiatrist, Dr. Ether Griffins on 02/16/2022 and he referred her to our clinic. He also prescribed her ?doxycycline. She  currently denies signs of infection. ?3/15; patient presents for follow-up. She has been using Hydrofera Blue dressing without issues. She denies signs of infection. ?3/22; patient presents for follow-up. She tolerated the compression wrap well. She has no issues or complaints today. She brought in paperwork ?to be filled out for an accommodation request for work. ?3/29; patient presents for follow-up. She continues to tolerate the compression wrap well. She has no issues or complaints today. ?4/5; patient presents for follow-up. She has no issues or complaints today. We been using Hydrofera Blue under Kerlix/Coban with improvement ?in wound healing. ?Electronic Signature(s) ?Signed: 03/29/2022 12:16:21 PM By: Geralyn Corwin DO ?Entered By: Geralyn Corwin on 03/29/2022 12:12:56 ?Wildey, Meygan ([id number]) ?-------------------------------------------------------------------------------- ?Physical Exam Details ?Patient Name: ALASHA, MCGUINNESS ?Date of Service: 03/29/2022 11:30 AM ?Medical Record Number: [id number] ?Patient Account Number: [id number] ?Date of Birth/Sex: January 08, 1986 (36 y.o. F) ?Treating RN: Hansel Feinstein ?Primary Care Provider: Hezzie Bump Other Clinician: ?Referring Provider: Hezzie Bump ?Treating Provider/Extender: Geralyn Corwin ?Weeks in Treatment: 4 ?Constitutional ?. ?Cardiovascular ?Marland Kitchen ?Psychiatric ?Marland Kitchen ?Notes ?Right lower extremity: Open wound with epithelization and granulation tissue. Good edema control. No surrounding signs of infection. ?Electronic Signature(s) ?Signed: 03/29/2022 12:16:21 PM By: Geralyn Corwin DO ?Entered By: Geralyn Corwin on 03/29/2022 12:13:32 ?Pizzolato, Fawnda ([id number]) ?-------------------------------------------------------------------------------- ?Physician Orders Details ?Patient Name: ESPYN, RADWAN ?Date of Service: 03/29/2022 11:30 AM ?Medical Record Number: [id number] ?Patient Account Number: [id number] ?Date of Birth/Sex: Jul 10, 1986 (36 y.o. F) ?Treating RN: Hansel Feinstein ?Primary Care Provider: Hezzie Bump Other Clinician: ?Referring Provider: Hezzie Bump ?Treating Provider/Extender: Geralyn Corwin ?Weeks in Treatment: 4 ?Verbal / Phone Orders: No ?Diagnosis Coding ?Follow-up Appointments ?o Return Appointment in 1 week. ?o Nurse Visit as needed ?Bathing/ Shower/ Hygiene ?o Clean wound with Normal Saline or wound cleanser. - keep dressing dry or change after shower ?o May shower with wound dressing protected with water repellent cover or cast protector. ?o No tub bath. ?  Anesthetic (Use 'Patient Medications' Section for Anesthetic Order Entry) ?o Lidocaine applied to wound bed ?Edema Control - Lymphedema / Segmental Compressive Device / Other ?o Elevate legs to the level of the heart and pump ankles as often as possible ?Additional Orders / Instructions ?o Follow Nutritious Diet and Increase Protein Intake ?Wound Treatment ?Wound #1 - Lower Leg Wound Laterality: Right, Lateral ?Cleanser: Wound Cleanser 1 x Per Week/15 Days ?Discharge Instructions: Wash your hands with soap and water. Remove old dressing, discard into plastic bag and place into trash. ?Cleanse the wound with Wound Cleanser prior to applying a clean dressing using gauze sponges, not tissues or cotton balls. Do not ?scrub or use excessive force. Pat dry using gauze sponges, not tissue or cotton balls. ?Primary Dressing: Prisma 4.34 (in) 1 x Per Week/15 Days ?Discharge Instructions: Moisten w/normal saline or sterile water; Cover wound as directed. Do not remove from wound bed. ?Secondary Dressing: Gauze 1 x Per Week/15 Days ?Secured With: Medipore Tape - 46M Medipore H Soft Cloth Surgical Tape, 2x2 (in/yd) 1 x Per Week/15 Days ?Secured With: Coban Cohesive Bandage 4x5 (yds) Stretched 1 x Per Week/15 Days ?Discharge Instructions: layer 2-Apply Coban as directed. ?Secured With: [employer] or Non-Sterile 6-ply 4.5x4 (yd/yd) 1 x Per Week/15 Days ?Discharge Instructions: layer 1-Apply Kerlix as  directed ?Electronic Signature(s) ?Signed: 03/29/2022 12:16:21 PM By: Geralyn Corwin DO ?Previous Signature: 03/29/2022 12:06:15 PM Version By: Hansel Feinstein ?Entered By: Geralyn Corwin on 03/29/2022 12:14:55 ?Bezold, Deshia ([id number]) ?-------------------------------------------------------------------------------- ?Problem List Details ?Patient Name: JAMYRIA, OZANICH ?Date of Service: 03/29/2022 11:30 AM ?Medical Record Number: [id number] ?Patient Account Number: [id number] ?Date of Birth/Sex: 1986-01-05 (36 y.o. 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 03/01/2022 No Yes ?exposed ?I87.311 Chronic venous hypertension (idiopathic) with ulcer of right lower 03/15/2022 No Yes ?extremity ?G80.8 Other cerebral palsy 03/01/2022 No Yes ?Inactive Problems ?Resolved Problems ?Electronic Signature(s) ?Signed: 03/29/2022 12:16:21 PM By: Geralyn Corwin DO ?Entered By: Geralyn Corwin on 03/29/2022 12:12:17 ?Hannibal, Jacelyn ([id number]) ?-------------------------------------------------------------------------------- ?Progress Note Details ?Patient Name: ALEYDA, GINDLESPERGER ?Date of Service: 03/29/2022 11:30 AM ?Medical Record Number: [id number] ?Patient Account Number: [id number] ?Date of Birth/Sex: 1986/02/19 (36 y.o. F) ?Treating RN: Hansel Feinstein ?Primary Care Provider: Hezzie Bump Other Clinician: ?Referring Provider: Hezzie Bump ?Treating Provider/Extender: Geralyn Corwin ?Weeks in Treatment: 4 ?Subjective ?Chief Complaint ?Information obtained from Patient ?Right lower extremity wound ?History of Present Illness (HPI) ?Admission 03/01/2022 ?Ms. Laiklyn Pilkenton is a 36 year old female with a past medical history of cerebral palsy with leg  braces, spina bifida and myasthenia gravis that ?presents to the clinic for a 82-month history of nonhealing ulcer to the right lower extremity. She states that the brace was rubbing up against her ?leg causing a

## 2022-04-05 ENCOUNTER — Encounter: Payer: Medicaid Other | Admitting: Internal Medicine

## 2022-04-05 DIAGNOSIS — I87311 Chronic venous hypertension (idiopathic) with ulcer of right lower extremity: Secondary | ICD-10-CM | POA: Diagnosis not present

## 2022-04-05 NOTE — Progress Notes (Signed)
[name] ([id number]) ?Visit Report for 04/05/2022 ?Arrival Information Details ?Patient Name: [name], [name] ?Date of Service: 04/05/2022 1:00 PM ?Medical Record Number: [id number] ?Patient Account Number: [id number] ?Date of Birth/Sex: Jan 10, 1986 (36 y.o. F) ?Treating RN: [name] ?Primary Care [name]: Hezzie Bump Other Clinician: ?Referring [name]: Hezzie Bump ?Treating [name]/Extender: [name] ?Weeks in Treatment: 5 ?Visit Information History Since Last Visit ?Added or deleted any medications: No ?Patient Arrived: [name] ?Had a fall or experienced change in No ?Arrival Time: 12:56 ?activities of daily living that may affect ?Accompanied By: husband ?risk of falls: ?Transfer Assistance: None ?Hospitalized since last visit: No ?Patient Identification Verified: Yes ?Has Dressing in Place as Prescribed: Yes ?Secondary Verification Process Completed: Yes ?Pain Present Now: No ?Patient Requires Transmission-Based Precautions: No ?Patient Has Alerts: Yes ?Patient Alerts: DIABETIC ?Electronic Signature(s) ?Signed: 04/05/2022 2:59:40 PM By: [name] ?Entered ByHansel [name] on 04/05/2022 12:57:34 ?[name] ([id number]) ?-------------------------------------------------------------------------------- ?Clinic Level of Care Assessment Details ?Patient Name: [name], [name] ?Date of Service: 04/05/2022 1:00 PM ?Medical Record Number: [id number] ?Patient Account Number: [id number] ?Date of Birth/Sex: 12/19/1986 (36 y.o. F) ?Treating RN: [name] ?Primary Care [name]: Hezzie Bump Other Clinician: ?Referring [name]: Hezzie Bump ?Treating [name]/Extender: [name] ?Weeks in Treatment: 5 ?Clinic Level of Care Assessment Items ?TOOL 4 Quantity Score ?[]  - Use when only an EandM is performed on FOLLOW-UP visit 0 ?ASSESSMENTS - Nursing Assessment / Reassessment ?[]  - Reassessment of Co-morbidities (includes updates in patient status) 0 ?[]  - 0 ?Reassessment of Adherence to Treatment  Plan ?ASSESSMENTS - Wound and Skin Assessment / Reassessment ?X - Simple Wound Assessment / Reassessment - one wound 1 5 ?[]  - 0 ?Complex Wound Assessment / Reassessment - multiple wounds ?[]  - 0 ?Dermatologic / Skin Assessment (not related to wound area) ?ASSESSMENTS - Focused Assessment ?[]  - Circumferential Edema Measurements - multi extremities 0 ?[]  - 0 ?Nutritional Assessment / Counseling / Intervention ?[]  - 0 ?Lower Extremity Assessment (monofilament, tuning fork, pulses) ?[]  - 0 ?Peripheral Arterial Disease Assessment (using hand held doppler) ?ASSESSMENTS - Ostomy and/or Continence Assessment and Care ?[]  - Incontinence Assessment and Management 0 ?[]  - 0 ?Ostomy Care Assessment and Management (repouching, etc.) ?PROCESS - Coordination of Care ?X - Simple Patient / Family Education for ongoing care 1 15 ?[]  - 0 ?Complex (extensive) Patient / Family Education for ongoing care ?X- 1 10 ?Staff obtains Consents, Records, Test Results / Process Orders ?[]  - 0 ?Staff telephones HHA, Nursing Homes / Clarify orders / etc ?[]  - 0 ?Routine Transfer to another Facility (non-emergent condition) ?[]  - 0 ?Routine Hospital Admission (non-emergent condition) ?[]  - 0 ?New Admissions / / Ordering NPWT, Apligraf, etc. ?[]  - 0 ?Emergency Hospital Admission (emergent condition) ?X- 1 10 ?Simple Discharge Coordination ?[]  - 0 ?Complex (extensive) Discharge Coordination ?PROCESS - Special Needs ?[]  - Pediatric / Minor Patient Management 0 ?[]  - 0 ?Isolation Patient Management ?[]  - 0 ?Hearing / Language / Visual special needs ?[]  - 0 ?Assessment of Community assistance (transportation, D/C planning, etc.) ?[]  - 0 ?Additional assistance / Altered mentation ?[]  - 0 ?Support Surface(s) Assessment (bed, cushion, seat, etc.) ?INTERVENTIONS - Wound Cleansing / Measurement ?[name] ( ) ?X- 1 5 ?Simple Wound Cleansing - one wound ?[]  - 0 ?Complex Wound Cleansing - multiple wounds ?X- 1 5 ?Wound  Imaging (photographs - any number of wounds) ?[]  - 0 ?Wound Tracing (instead of photographs) ?X- 1 5 ?Simple Wound Measurement - one wound ?[]  - 0 ?Complex Wound Measurement -  multiple wounds ?INTERVENTIONS - Wound Dressings ?X - Small Wound Dressing one or multiple wounds 1 10 ?[]  - 0 ?Medium Wound Dressing one or multiple wounds ?[]  - 0 ?Large Wound Dressing one or multiple wounds ?X- 1 5 ?Application of Medications - topical ?[]  - 0 ?Application of Medications - injection ?INTERVENTIONS - Miscellaneous ?[]  - External ear exam 0 ?[]  - 0 ?Specimen Collection (cultures, biopsies, blood, body fluids, etc.) ?[]  - 0 ?Specimen(s) / Culture(s) sent or taken to Lab for analysis ?[]  - 0 ?Patient Transfer (multiple staff / / Similar devices) ?[]  - 0 ?Simple Staple / Suture removal (25 or less) ?[]  - 0 ?Complex Staple / Suture removal (26 or more) ?[]  - 0 ?Hypo / Hyperglycemic Management (close monitor of Blood Glucose) ?[]  - 0 ?Ankle / Brachial Index (ABI) - do not check if billed separately ?X- 1 5 ?Vital Signs ?Has the patient been seen at the hospital within the last three years: Yes ?Total Score: 75 ?Level Of Care: New/Established - Level ?2 ?Electronic Signature(s) ?Signed: 04/05/2022 2:59:40 PM By: ?Entered By on 04/05/2022 13:22:43 ?[name] ( ) ?-------------------------------------------------------------------------------- ?Encounter Discharge Information Details ?Patient Name: [name], [name] ?Date of Service: 04/05/2022 1:00 PM ?Medical Record Number: ?Patient Account Number: ?Date of Birth/Sex: Dec 17, 1986 (36 y.o. F) ?Treating RN: 06/05/2022 ?Primary Care Marney Treloar: [name] Other Clinician: ?Referring Kee Drudge: [name] ?Treating Lash Matulich/Extender: 06/05/2022 ?Weeks in Treatment: 5 ?Encounter Discharge Information Items ?Discharge Condition: Stable ?Ambulatory Status: [id number] ?Discharge Destination: Home ?Transportation: Private  Auto ?Accompanied By: husband ?Schedule Follow-up Appointment: Yes ?Clinical Summary of Care: ?Electronic Signature(s) ?Signed: 04/05/2022 2:59:40 PM By: 06/05/2022 ?Entered [id number] on 04/05/2022 13:30:30 ?Wadlow, Marletta (02/23/1986) ?-------------------------------------------------------------------------------- ?Lower Extremity Assessment Details ?Patient Name: [name], [name] ?Date of Service: 04/05/2022 1:00 PM ?Medical Record Number: Hezzie Bump ?Patient Account Number: Hezzie Bump ?Date of Birth/Sex: 1986/06/10 (36 y.o. F) ?Treating RN: 06/05/2022 ?Primary Care Lashaun Poch: [name] Other Clinician: ?Referring Richy Spradley: [name] ?Treating Durand Wittmeyer/Extender: 06/05/2022 ?Weeks in Treatment: 5 ?Edema Assessment ?Assessed: [Left: No] [Right: Yes] ?Edema: [Left: N] [Right: o] ?Ankle ?Left: Right: ?Point of Measurement: From Medial Instep 22 cm ?Electronic Signature(s) ?Signed: 04/05/2022 2:59:40 PM By: Alphonsa Overall ?Entered By6/11/2022 on 04/05/2022 13:05:13 ?Genis, Frankie ([id number]) ?-------------------------------------------------------------------------------- ?Multi Wound Chart Details ?Patient Name: [name], [name] ?Date of Service: 04/05/2022 1:00 PM ?Medical Record Number: [name] ?Patient Account Number: Hezzie Bump ?Date of Birth/Sex: [birth year] (36 y.o. F) ?Treating RN: 06/05/2022 ?Primary Care Kyle Stansell: [name] Other Clinician: ?Referring Johnnae Impastato: [name] ?Treating Soham Hollett/Extender: 06/05/2022 ?Weeks in Treatment: 5 ?Vital Signs ?Height(in): 61 ?Pulse(bpm): 110 ?Weight(lbs): 200 ?Blood Pressure(mmHg): 130/72 ?Body Mass Index(BMI): 37.8 ?Temperature(??F): 98.3 ?Respiratory Rate(breaths/min): 16 ?Photos: [N/A:N/A] ?Wound Location: Right, Lateral Lower Leg N/A N/A ?Wounding Event: Bump N/A N/A ?Primary Etiology: Pressure Ulcer N/A N/A ?Secondary Etiology: Infection - not elsewhere classified N/A N/A ?Comorbid History: Hypertension, Type II Diabetes, N/A N/A ?Osteoarthritis,  Neuropathy, ?Paraplegia ?Date Acquired: 01/07/2022 N/A N/A ?Weeks of Treatment: 5 N/A N/A ?Wound Status: Open N/A N/A ?Wound Recurrence: No N/A N/A ?Measurements L x W x D (cm) 0.1x0.1x0.1 N/A N/A ?Area (cm?) : 0.00

## 2022-04-06 NOTE — Progress Notes (Signed)
Goings, Meredith ([id number]) ?Visit Report for 04/05/2022 ?HPI Details ?Patient Name: [name], [name] ?Date of Service: 04/05/2022 1:00 PM ?Medical Record Number: [id number] ?Patient Account Number: [id number] ?Date of Birth/Sex: 05/17/1986 (36 y.o. F) ?Treating RN: Hansel Feinstein ?Primary Care Provider: Hezzie Bump Other Clinician: ?Referring Provider: Hezzie Bump ?Treating Provider/Extender: Maxwell Caul ?Weeks in Treatment: 5 ?History of Present Illness ?HPI Description: Admission 03/01/2022 ?Ms. [name] is a 36 year old female with a past medical history of cerebral palsy with leg braces, spina bifida and myasthenia gravis that ?presents to the clinic for a 28-month history of nonhealing ulcer to the right lower extremity. She states that the brace was rubbing up against her ?leg causing a blister and subsequently developed a wound. She has stopped wearing the brace since her wound developed. She originally saw ?urgent care on 01/07/2022 for this issue. They prescribed doxycycline at that time. She has been keeping the wound open to air. She covers it ?when she goes outside. She recently saw her podiatrist, Dr. Ether Griffins on 02/16/2022 and he referred her to our clinic. He also prescribed her ?doxycycline. She currently denies signs of infection. ?3/15; patient presents for follow-up. She has been using Hydrofera Blue dressing without issues. She denies signs of infection. ?3/22; patient presents for follow-up. She tolerated the compression wrap well. She has no issues or complaints today. She brought in paperwork ?to be filled out for an accommodation request for work. ?3/29; patient presents for follow-up. She continues to tolerate the compression wrap well. She has no issues or complaints today. ?4/5; patient presents for follow-up. She has no issues or complaints today. We been using Hydrofera Blue under Kerlix/Coban with improvement ?in wound healing. ?4/12; the patient presents for follow-up. The area on the  right lateral lower leg is healed. Per discussion with the patient today she thinks this was ?trauma from her AFO brace that got secondarily infected ?Electronic Signature(s) ?Signed: 04/06/2022 7:47:50 AM By: Baltazar Najjar MD ?Entered By: Baltazar Najjar on 04/05/2022 13:34:21 ?Belton, [name] ([id number]) ?-------------------------------------------------------------------------------- ?Physical Exam Details ?Patient Name: [name], [name] ?Date of Service: 04/05/2022 1:00 PM ?Medical Record Number: [id number] ?Patient Account Number: [id number] ?Date of Birth/Sex: 05-15-1986 (36 y.o. F) ?Treating RN: Hansel Feinstein ?Primary Care Provider: Hezzie Bump Other Clinician: ?Referring Provider: Hezzie Bump ?Treating Provider/Extender: Maxwell Caul ?Weeks in Treatment: 5 ?Constitutional ?Sitting or standing Blood Pressure is within target range for patient.. Pulse regular and within target range for patient.Marland Kitchen Respirations regular, non- ?labored and within target range.. Temperature is normal and within the target range for the patient.Marland Kitchen appears in no distress. ?Notes ?4/12; right lower extremity some adherent collagen removed but there is no open wound. No surrounding infection no tenderness ?Electronic Signature(s) ?Signed: 04/06/2022 7:47:50 AM By: Baltazar Najjar MD ?Entered By: Baltazar Najjar on 04/05/2022 13:36:21 ?[name], [name] ([id number]) ?-------------------------------------------------------------------------------- ?Physician Orders Details ?Patient Name: [name], [name] ?Date of Service: 04/05/2022 1:00 PM ?Medical Record Number: [id number] ?Patient Account Number: [id number] ?Date of Birth/Sex: 08/28/1986 (36 y.o. F) ?Treating RN: Hansel Feinstein ?Primary Care Provider: Hezzie Bump Other Clinician: ?Referring Provider: Hezzie Bump ?Treating Provider/Extender: Maxwell Caul ?Weeks in Treatment: 5 ?Verbal / Phone Orders: No ?Diagnosis Coding ?Discharge From [facility] ?o Discharge from [facility] Treatment  Complete ?o Wear compression garments daily. Put garments on first thing when you wake up and remove them before bed. ?o Moisturize legs daily after removing compression garments. - Protect your skin from the orthotics causing wounds on your legs- ?keep newly healed skin protected with dressing  or covering for several weeks ?Electronic Signature(s) ?Signed: 04/05/2022 2:59:40 PM By: Hansel Feinstein ?Signed: 04/06/2022 7:47:50 AM By: Baltazar Najjar MD ?Entered ByHansel Feinstein on 04/05/2022 13:27:41 ?[name], [name] ([id number]) ?-------------------------------------------------------------------------------- ?Problem List Details ?Patient Name: [name], [name] ?Date of Service: 04/05/2022 1:00 PM ?Medical Record Number: [id number] ?Patient Account Number: [id number] ?Date of Birth/Sex: [birth year] (36 y.o. 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 03/01/2022 No Yes ?exposed ?I87.311 Chronic venous hypertension (idiopathic) with ulcer of right lower 03/15/2022 No Yes ?extremity ?G80.8 Other cerebral palsy 03/01/2022 No Yes ?Inactive Problems ?Resolved Problems ?Electronic Signature(s) ?Signed: 04/06/2022 7:47:50 AM By: Baltazar Najjar MD ?Entered By: Baltazar Najjar on 04/05/2022 13:33:44 ?[name], [name] ([id number]) ?-------------------------------------------------------------------------------- ?Progress Note Details ?Patient Name: [name], [name] ?Date of Service: 04/05/2022 1:00 PM ?Medical Record Number: [id number] ?Patient Account Number: [id number] ?Date of Birth/Sex: Nov 26, 1986 (36 y.o. F) ?Treating RN: Hansel Feinstein ?Primary Care Provider: Hezzie Bump Other Clinician: ?Referring Provider: Hezzie Bump ?Treating  Provider/Extender: Maxwell Caul ?Weeks in Treatment: 5 ?Subjective ?History of Present Illness (HPI) ?Admission 03/01/2022 ?Ms. [name] is a 36 year old female with a past medical history of cerebral palsy with leg braces, spina bifida and myasthenia gravis that ?presents to the clinic for a 67-month history of nonhealing ulcer to the right lower extremity. She states that the brace was rubbing up against her ?leg causing a blister and subsequently developed a wound. She has stopped wearing the brace since her wound developed. She originally saw ?urgent care on 01/07/2022 for this issue. They prescribed doxycycline at that time. She has been keeping the wound open to air. She covers it ?when she goes outside. She recently saw her podiatrist, Dr. Ether Griffins on 02/16/2022 and he referred her to our clinic. He also prescribed her ?doxycycline. She currently denies signs of infection. ?3/15; patient presents for follow-up. She has been using Hydrofera Blue dressing without issues. She denies signs of infection. ?3/22; patient presents for follow-up. She tolerated the compression wrap well. She has no issues or complaints today. She brought in paperwork ?to be filled out for an accommodation request for work. ?3/29; patient presents for follow-up. She continues to tolerate the compression wrap well. She has no issues or complaints today. ?4/5; patient presents for follow-up. She has no issues or complaints today. We been using Hydrofera Blue under Kerlix/Coban with improvement ?in wound healing. ?4/12; the patient presents for follow-up. The area on the right lateral lower leg is healed. Per discussion with the patient today she thinks this was ?trauma from her AFO brace that got secondarily infected ?Objective ?Constitutional ?Sitting or standing Blood Pressure is within target range for patient.. Pulse regular and within target range for patient.Marland Kitchen Respirations regular, non- ?labored and within target range..  Temperature is normal and within the target range for the patient.Marland Kitchen appears in no distress. ?Vitals Time Taken: 12:57 PM, Height: 61 in, Weight: 200 lbs, BMI: 37.8, Temperature: 98.3 ??F, Pulse: 110 bpm, Respiratory Rate:

## 2022-04-12 ENCOUNTER — Ambulatory Visit: Payer: Medicaid Other | Admitting: Internal Medicine

## 2022-04-19 ENCOUNTER — Ambulatory Visit: Payer: Medicaid Other | Admitting: Internal Medicine

## 2022-06-12 ENCOUNTER — Emergency Department
Admission: EM | Admit: 2022-06-12 | Discharge: 2022-06-12 | Disposition: A | Discharge status: Home / Self Care | Payer: Medicaid Other | Attending: Emergency Medicine | Admitting: Emergency Medicine

## 2022-06-12 ENCOUNTER — Other Ambulatory Visit: Payer: Self-pay

## 2022-06-12 ENCOUNTER — Encounter: Payer: Self-pay | Admitting: Emergency Medicine

## 2022-06-12 DIAGNOSIS — D72829 Elevated white blood cell count, unspecified: Secondary | ICD-10-CM | POA: Insufficient documentation

## 2022-06-12 DIAGNOSIS — N390 Urinary tract infection, site not specified: Secondary | ICD-10-CM | POA: Diagnosis not present

## 2022-06-12 DIAGNOSIS — N189 Chronic kidney disease, unspecified: Secondary | ICD-10-CM | POA: Diagnosis not present

## 2022-06-12 DIAGNOSIS — R300 Dysuria: Secondary | ICD-10-CM | POA: Diagnosis present

## 2022-06-12 DIAGNOSIS — E1122 Type 2 diabetes mellitus with diabetic chronic kidney disease: Secondary | ICD-10-CM | POA: Diagnosis not present

## 2022-06-12 LAB — CBC
HCT: 38.2 % (ref 36.0–46.0)
Hemoglobin: 12 g/dL (ref 12.0–15.0)
MCH: 24.3 pg — ABNORMAL LOW (ref 26.0–34.0)
MCHC: 31.4 g/dL (ref 30.0–36.0)
MCV: 77.5 fL — ABNORMAL LOW (ref 80.0–100.0)
Platelets: 332 10*3/uL (ref 150–400)
RBC: 4.93 MIL/uL (ref 3.87–5.11)
RDW: 14 % (ref 11.5–15.5)
WBC: 12.5 10*3/uL — ABNORMAL HIGH (ref 4.0–10.5)
nRBC: 0 % (ref 0.0–0.2)

## 2022-06-12 LAB — URINALYSIS, ROUTINE W REFLEX MICROSCOPIC
Bilirubin Urine: NEGATIVE
Glucose, UA: >=500 mg/dL — AB
Ketones, ur: 5 mg/dL — AB
Nitrite: POSITIVE — AB
Protein, ur: NEGATIVE mg/dL
Specific Gravity, Urine: 1.028 (ref 1.005–1.030)
pH: 5 (ref 5.0–8.0)

## 2022-06-12 LAB — COMPREHENSIVE METABOLIC PANEL
ALT: 11 U/L (ref 0–44)
AST: 12 U/L — ABNORMAL LOW (ref 15–41)
Albumin: 3.6 g/dL (ref 3.5–5.0)
Alkaline Phosphatase: 61 U/L (ref 38–126)
Anion gap: 8 (ref 5–15)
BUN: 12 mg/dL (ref 6–20)
CO2: 24 mmol/L (ref 22–32)
Calcium: 8.9 mg/dL (ref 8.9–10.3)
Chloride: 100 mmol/L (ref 98–111)
Creatinine, Ser: 0.73 mg/dL (ref 0.44–1.00)
GFR, Estimated: >60 mL/min (ref >60)
Glucose, Bld: 327 mg/dL — ABNORMAL HIGH (ref 70–99)
Potassium: 4 mmol/L (ref 3.5–5.1)
Sodium: 132 mmol/L — ABNORMAL LOW (ref 135–145)
Total Bilirubin: 0.9 mg/dL (ref 0.3–1.2)
Total Protein: 7.6 g/dL (ref 6.5–8.1)

## 2022-06-12 MED ORDER — NITROFURANTOIN MONOHYD MACRO 100 MG PO CAPS: 100.0000 mg | ORAL_CAPSULE | Freq: Two times a day (BID) | ORAL | 0 refills | Status: AC

## 2022-06-12 NOTE — ED Triage Notes (Signed)
Pt has hx of spina bifida and has to self cath to void. States for 2 days has had chills, and burning when she caths. STates hx of frequent UTI's.

## 2022-06-12 NOTE — ED Notes (Signed)
See triage note  presents with urinary freq and pain  also has been having low grade temp

## 2022-06-12 NOTE — ED Provider Notes (Signed)
[facility] Provider Note    Event Date/Time   First MD Initiated Contact with Patient 06/12/22 1120     (approximate)   History   Dysuria   HPI  [name] is a 36 y.o. female with a history of CKD, diabetes, spina bifida who self caths.  Patient reports that she thinks she has a UTI.  She has some burning in her lower abdomen consistent with UTI symptoms she has had the past.  She denies nausea vomiting, no back pain.  She reports in the past she has been treated with oral antibiotics successfully     Physical Exam   Triage Vital Signs: ED Triage Vitals [06/12/22 1036]  Enc Vitals Group     BP (!) 133/105     Pulse Rate 97     Resp 18     Temp 99 F (37.2 C)     Temp Source Oral     SpO2 95 %     Weight 90.7 kg (200 lb)     Height 1.524 m (5')     Head Circumference      Peak Flow      Pain Score 7     Pain Loc      Pain Edu?      Excl. in [place]?     Most recent vital signs: Vitals:   06/12/22 1036  BP: (!) 133/105  Pulse: 97  Resp: 18  Temp: 99 F (37.2 C)  SpO2: 95%     General: Awake, no distress.  CV:  Good peripheral perfusion.  Resp:  Normal effort.  Abd:  No distention.  Reassuring exam no CVA tenderness Other:     ED Results / Procedures / Treatments   Labs (all labs ordered are listed, but only abnormal results are displayed) Labs Reviewed  CBC - Abnormal; Notable for the following components:      Result Value   WBC 12.5 (*)    MCV 77.5 (*)    MCH 24.3 (*)    All other components within normal limits  COMPREHENSIVE METABOLIC PANEL - Abnormal; Notable for the following components:   Sodium 132 (*)    Glucose, Bld 327 (*)    AST 12 (*)    All other components within normal limits  URINALYSIS, ROUTINE W REFLEX MICROSCOPIC - Abnormal; Notable for the following components:   Color, Urine YELLOW (*)    APPearance HAZY (*)    Glucose, UA >=500 (*)    Hgb urine dipstick SMALL (*)    Ketones, ur 5 (*)     Nitrite POSITIVE (*)    Leukocytes,Ua SMALL (*)    Bacteria, UA MANY (*)    All other components within normal limits     EKG     RADIOLOGY     PROCEDURES:  Critical Care performed:   Procedures   MEDICATIONS ORDERED IN ED: Medications - No data to display   IMPRESSION / MDM / ASSESSMENT AND PLAN / ED COURSE  I reviewed the triage vital signs and the nursing notes. Patient's presentation is most consistent with acute illness / injury with system symptoms.   Patient presents with UTI symptoms as detailed above in the setting of self-catheterization.  Differential includes UTI, cystitis, pyelonephritis  Lab work notable for very mild elevation of white blood cell count, urinalysis is consistent with UTI, glucose mildly elevated but normal anion gap  Discussed with patient and considered admission however she feels treating at  home with antibiotics is preferable, she will return if any worsening symptoms including nausea vomiting worsening fever       FINAL CLINICAL IMPRESSION(S) / ED DIAGNOSES   Final diagnoses:  Lower urinary tract infectious disease     Rx / DC Orders   ED Discharge Orders          Ordered    nitrofurantoin, macrocrystal-monohydrate, (MACROBID) 100 MG capsule  2 times daily        06/12/22 1131             Note:  This document was prepared using Dragon voice recognition software and may include unintentional dictation errors.   Jene Every, MD 06/12/22 [phone]

## 2022-12-27 ENCOUNTER — Emergency Department
Admission: EM | Admit: 2022-12-27 | Discharge: 2022-12-28 | Disposition: A | Discharge status: Home / Self Care | Payer: Medicaid Other | Attending: Emergency Medicine | Admitting: Emergency Medicine

## 2022-12-27 ENCOUNTER — Encounter: Payer: Self-pay | Admitting: *Deleted

## 2022-12-27 ENCOUNTER — Other Ambulatory Visit: Payer: Self-pay

## 2022-12-27 DIAGNOSIS — N390 Urinary tract infection, site not specified: Secondary | ICD-10-CM | POA: Diagnosis not present

## 2022-12-27 DIAGNOSIS — N189 Chronic kidney disease, unspecified: Secondary | ICD-10-CM | POA: Insufficient documentation

## 2022-12-27 DIAGNOSIS — I129 Hypertensive chronic kidney disease with stage 1 through stage 4 chronic kidney disease, or unspecified chronic kidney disease: Secondary | ICD-10-CM | POA: Insufficient documentation

## 2022-12-27 DIAGNOSIS — R300 Dysuria: Secondary | ICD-10-CM | POA: Diagnosis present

## 2022-12-27 DIAGNOSIS — E1122 Type 2 diabetes mellitus with diabetic chronic kidney disease: Secondary | ICD-10-CM | POA: Insufficient documentation

## 2022-12-27 LAB — URINALYSIS, ROUTINE W REFLEX MICROSCOPIC
Bilirubin Urine: NEGATIVE
Glucose, UA: >=500 mg/dL — AB
Hgb urine dipstick: NEGATIVE
Ketones, ur: 5 mg/dL — AB
Nitrite: NEGATIVE
Protein, ur: NEGATIVE mg/dL
Specific Gravity, Urine: 1.022 (ref 1.005–1.030)
WBC, UA: >50 WBC/hpf — ABNORMAL HIGH (ref 0–5)
pH: 5 (ref 5.0–8.0)

## 2022-12-27 LAB — POC URINE PREG, ED: Preg Test, Ur: NEGATIVE

## 2022-12-27 NOTE — ED Triage Notes (Addendum)
Pt states dx with uti  at [facility] in [place].  Pt also reports e coli in urine sample.  Pt was told to come to er for eval.  Pt alert  speech clear.  Pt taking septra without any relief.

## 2022-12-28 MED ORDER — NITROFURANTOIN MONOHYD MACRO 100 MG PO CAPS: 100.0000 mg | ORAL_CAPSULE | Freq: Two times a day (BID) | ORAL | 0 refills | Status: AC

## 2022-12-28 MED ORDER — NITROFURANTOIN MONOHYD MACRO 100 MG PO CAPS
100.0000 mg | ORAL_CAPSULE | Freq: Once | ORAL | Status: AC
  Administered 2022-12-28: 100 mg via ORAL
  Filled 2022-12-28: qty 1

## 2022-12-28 NOTE — ED Provider Notes (Signed)
[facility] Emergency Department Provider Note     Event Date/Time   First MD Initiated Contact with Patient 12/27/22 2244     (approximate)   History   Dysuria   HPI  [name] is a 37 y.o. female with a history of diabetes, sickle cell trait, Mina bifida, hypertension, CKD, recurrent UTIs, presents to the ED after she apparently had a recent positive urine culture.  Patient was initially presented to [facility] hospital for routine annual exam, in early December.  Her urine sample that she collected and submitted at the clinic was apparently lost.  She subsequently was evaluated at [facility] on 12/29, and was found to have evidence of E. coli bacteriuria.  She had been empirically started on Bactrim given her many drug allergies.  She was notified today, that one of the present providers was not comfortable prescribing her an appropriate antibiotic, due to her multiple drug allergies.  Patient presents today in no acute distress.  She does endorse limited benefit with her previous doses of Bactrim.  She denies any frank hematuria, or urinary retention, but does endorse urinary frequency and a mild flank pain.  Patient has to self cath due to spina bifida.   Physical Exam   Triage Vital Signs: ED Triage Vitals  Enc Vitals Group     BP 12/27/22 2004 (!) 131/96     Pulse Rate 12/27/22 2004 100     Resp 12/27/22 2004 20     Temp 12/27/22 2004 99.7 F (37.6 C)     Temp Source 12/27/22 2004 Oral     SpO2 12/27/22 2004 99 %     Weight 12/27/22 2015 198 lb 6.6 oz (90 kg)     Height 12/27/22 2015 5' (1.524 m)     Head Circumference --      Peak Flow --      Pain Score --      Pain Loc --      Pain Edu? --      Excl. in [place]? --     Most recent vital signs: Vitals:   12/27/22 2004  BP: (!) 131/96  Pulse: 100  Resp: 20  Temp: 99.7 F (37.6 C)  SpO2: 99%    General Awake, no distress. NAD CV:  Good peripheral perfusion.  RESP:  Normal  effort.  ABD:  No distention.    ED Results / Procedures / Treatments   Labs (all labs ordered are listed, but only abnormal results are displayed) Labs Reviewed  URINALYSIS, ROUTINE W REFLEX MICROSCOPIC - Abnormal; Notable for the following components:      Result Value   Color, Urine YELLOW (*)    APPearance HAZY (*)    Glucose, UA >=500 (*)    Ketones, ur 5 (*)    Leukocytes,Ua TRACE (*)    WBC, UA >50 (*)    Bacteria, UA MANY (*)    All other components within normal limits  URINE CULTURE  POC URINE PREG, ED     EKG   RADIOLOGY  No results found.   PROCEDURES:  Critical Care performed: No  Procedures   MEDICATIONS ORDERED IN ED: Medications  nitrofurantoin (macrocrystal-monohydrate) (MACROBID) capsule 100 mg (has no administration in time range)     IMPRESSION / MDM / ASSESSMENT AND PLAN / ED COURSE  I reviewed the triage vital signs and the nursing notes.  Differential diagnosis includes, but is not limited to, UTI, pyelonephritis, vaginitis  Patient's presentation is most consistent with acute complicated illness / injury requiring diagnostic workup.   Patient's diagnosis is consistent with E. coli bacteriuria.  Chart review does reveal the recent urine culture results, confirming susceptibility to ciprofloxacin, nitrofurantoin, and tetracycline.  Patient will be discharged home with prescriptions for nitrofurantoin.  Urine culture is pending from the sample provided today.  Patient is to follow up with urologist as needed or otherwise directed. Patient is given ED precautions to return to the ED for any worsening or new symptoms.  FINAL CLINICAL IMPRESSION(S) / ED DIAGNOSES   Final diagnoses:  Lower urinary tract infectious disease     Rx / DC Orders   ED Discharge Orders          Ordered    nitrofurantoin, macrocrystal-monohydrate, (MACROBID) 100 MG capsule  2 times daily        12/28/22 0016              Note:  This document was prepared using Dragon voice recognition software and may include unintentional dictation errors.    Melvenia Needles, PA-C 12/28/22 0028    Paulette Blanch, MD 01/04/23 [phone]

## 2022-12-28 NOTE — Discharge Instructions (Addendum)
Take the antibiotic as directed. Follow-up with your urologist as discussed. Return if needed.

## 2022-12-30 LAB — URINE CULTURE: Culture: >=100000 — AB

## 2023-12-02 ENCOUNTER — Emergency Department
Admission: EM | Admit: 2023-12-02 | Discharge: 2023-12-02 | Disposition: A | Discharge status: Home / Self Care | Payer: MEDICAID | Attending: Emergency Medicine | Admitting: Emergency Medicine

## 2023-12-02 ENCOUNTER — Other Ambulatory Visit: Payer: Self-pay

## 2023-12-02 DIAGNOSIS — I10 Essential (primary) hypertension: Secondary | ICD-10-CM | POA: Diagnosis not present

## 2023-12-02 DIAGNOSIS — R202 Paresthesia of skin: Secondary | ICD-10-CM

## 2023-12-02 DIAGNOSIS — G5601 Carpal tunnel syndrome, right upper limb: Secondary | ICD-10-CM | POA: Insufficient documentation

## 2023-12-02 DIAGNOSIS — E119 Type 2 diabetes mellitus without complications: Secondary | ICD-10-CM | POA: Insufficient documentation

## 2023-12-02 DIAGNOSIS — N3090 Cystitis, unspecified without hematuria: Secondary | ICD-10-CM | POA: Insufficient documentation

## 2023-12-02 LAB — CBC WITH DIFFERENTIAL/PLATELET
Abs Immature Granulocytes: 0.1 10*3/uL — ABNORMAL HIGH (ref 0.00–0.07)
Basophils Absolute: 0.1 10*3/uL (ref 0.0–0.1)
Basophils Relative: 1 %
Eosinophils Absolute: 0.4 10*3/uL (ref 0.0–0.5)
Eosinophils Relative: 3 %
HCT: 36.2 % (ref 36.0–46.0)
Hemoglobin: 11.5 g/dL — ABNORMAL LOW (ref 12.0–15.0)
Immature Granulocytes: 1 %
Lymphocytes Relative: 27 %
Lymphs Abs: 2.8 10*3/uL (ref 0.7–4.0)
MCH: 23.4 pg — ABNORMAL LOW (ref 26.0–34.0)
MCHC: 31.8 g/dL (ref 30.0–36.0)
MCV: 73.7 fL — ABNORMAL LOW (ref 80.0–100.0)
Monocytes Absolute: 1.2 10*3/uL — ABNORMAL HIGH (ref 0.1–1.0)
Monocytes Relative: 12 %
Neutro Abs: 5.7 10*3/uL (ref 1.7–7.7)
Neutrophils Relative %: 56 %
Platelets: 380 10*3/uL (ref 150–400)
RBC: 4.91 MIL/uL (ref 3.87–5.11)
RDW: 15.2 % (ref 11.5–15.5)
WBC: 10.3 10*3/uL (ref 4.0–10.5)
nRBC: 0 % (ref 0.0–0.2)

## 2023-12-02 LAB — COMPREHENSIVE METABOLIC PANEL
ALT: 9 U/L (ref 0–44)
AST: 11 U/L — ABNORMAL LOW (ref 15–41)
Albumin: 3.5 g/dL (ref 3.5–5.0)
Alkaline Phosphatase: 54 U/L (ref 38–126)
Anion gap: 8 (ref 5–15)
BUN: 12 mg/dL (ref 6–20)
CO2: 23 mmol/L (ref 22–32)
Calcium: 8.2 mg/dL — ABNORMAL LOW (ref 8.9–10.3)
Chloride: 101 mmol/L (ref 98–111)
Creatinine, Ser: 0.65 mg/dL (ref 0.44–1.00)
GFR, Estimated: >60 mL/min (ref >60)
Glucose, Bld: 235 mg/dL — ABNORMAL HIGH (ref 70–99)
Potassium: 3.5 mmol/L (ref 3.5–5.1)
Sodium: 132 mmol/L — ABNORMAL LOW (ref 135–145)
Total Bilirubin: 0.6 mg/dL (ref <1.2)
Total Protein: 7.3 g/dL (ref 6.5–8.1)

## 2023-12-02 LAB — URINALYSIS, W/ REFLEX TO CULTURE (INFECTION SUSPECTED)
Bilirubin Urine: NEGATIVE
Glucose, UA: >1000 mg/dL — AB
Ketones, ur: NEGATIVE mg/dL
Nitrite: NEGATIVE
Protein, ur: NEGATIVE mg/dL
Specific Gravity, Urine: 1.025 (ref 1.005–1.030)
pH: 5.5 (ref 5.0–8.0)

## 2023-12-02 MED ORDER — CIPROFLOXACIN HCL 500 MG PO TABS: 500.0000 mg | ORAL_TABLET | Freq: Two times a day (BID) | ORAL | 0 refills | Status: AC

## 2023-12-02 MED ORDER — GABAPENTIN 100 MG PO CAPS
200.0000 mg | ORAL_CAPSULE | ORAL | Status: AC
  Administered 2023-12-02: 200 mg via ORAL
  Filled 2023-12-02: qty 2

## 2023-12-02 MED ORDER — GABAPENTIN 100 MG PO CAPS: ORAL_CAPSULE | ORAL | 0 refills | Status: AC

## 2023-12-02 NOTE — ED Triage Notes (Signed)
C?O 2 days history of pain to arms bilateral and tingling.  Patient with UTI symptoms (self cath Q2-3 hours), also ankle swelling. Hx spina bifida

## 2023-12-02 NOTE — ED Provider Notes (Signed)
[facility] Provider Note    Event Date/Time   First MD Initiated Contact with Patient 12/02/23 2034     (approximate)   History   Chief Complaint: No chief complaint on file.   HPI  [name] is a 37 y.o. female with a history of diabetes, hypertension, spina bifida with neurogenic bladder, myasthenia gravis on chronic steroids who comes ED complaining of paresthesia of bilateral forearms extending into the wrist, right greater than left, ongoing for last 2 days but a chronic recurrent problem.  Denies any neck pain vision changes motor weakness or change in balance or coordination.  No falls or trauma, no head injuries.  No fever.  No changes in steroid dosing or other medications.  Reports dysuria.          Physical Exam   Triage Vital Signs: ED Triage Vitals  Encounter Vitals Group     BP 12/02/23 1629 (!) 185/116     Systolic BP Percentile --      Diastolic BP Percentile --      Pulse Rate 12/02/23 1629 99     Resp 12/02/23 1629 16     Temp 12/02/23 1629 99 F (37.2 C)     Temp Source 12/02/23 2134 Oral     SpO2 12/02/23 1629 99 %     Weight 12/02/23 1633 198 lb 6.6 oz (90 kg)     Height --      Head Circumference --      Peak Flow --      Pain Score 12/02/23 1633 0     Pain Loc --      Pain Education --      Exclude from Growth Chart --     Most recent vital signs: Vitals:   12/02/23 1634 12/02/23 2134  BP: (!) 154/114 (!) 160/97  Pulse:  87  Resp:  18  Temp:  98.7 F (37.1 C)  SpO2:  100%    General: Awake, no distress.  CV:  Good peripheral perfusion.  Regular rate rhythm Resp:  Normal effort.  Clear to auscultation bilaterally Abd:  No distention.  Soft with mild suprapubic tenderness Other:  Full range of motion all extremities.  Normal speech and language, normal motor function bilaterally.  Cranial nerves III through XII intact.  No lid lag or nystagmus.  Sensation intact bilaterally.  NIH stroke scale  0   ED Results / Procedures / Treatments   Labs (all labs ordered are listed, but only abnormal results are displayed) Labs Reviewed  CBC WITH DIFFERENTIAL/PLATELET - Abnormal; Notable for the following components:      Result Value   Hemoglobin 11.5 (*)    MCV 73.7 (*)    MCH 23.4 (*)    Monocytes Absolute 1.2 (*)    Abs Immature Granulocytes 0.10 (*)    All other components within normal limits  COMPREHENSIVE METABOLIC PANEL - Abnormal; Notable for the following components:   Sodium 132 (*)    Glucose, Bld 235 (*)    Calcium 8.2 (*)    AST 11 (*)    All other components within normal limits  URINALYSIS, W/ REFLEX TO CULTURE (INFECTION SUSPECTED) - Abnormal; Notable for the following components:   Glucose, UA >1,000 (*)    Hgb urine dipstick MODERATE (*)    Leukocytes,Ua TRACE (*)    Bacteria, UA MANY (*)    All other components within normal limits     EKG    RADIOLOGY  PROCEDURES:  Procedures   MEDICATIONS ORDERED IN ED: Medications - No data to display   IMPRESSION / MDM / ASSESSMENT AND PLAN / ED COURSE  I reviewed the triage vital signs and the nursing notes.  DDx: Electrolyte abnormality, anemia, AKI, UTI, carpal tunnel syndrome.  Doubt myasthenic crisis, syringomyelia, transverse myelitis, meningitis, spinal injury  Patient's presentation is most consistent with acute presentation with potential threat to life or bodily function.  Patient presents with bilateral hand paresthesia, right greater than left.  Feet unaffected.  Suspect this is related to carpal tunnel syndrome.  She does report it was worse with keyboard work.  Bilateral wrist brace applied, will have her follow-up with orthopedics.  Also has UTI, prescribed Cipro.  Will start gabapentin for her paresthesia pain.       FINAL CLINICAL IMPRESSION(S) / ED DIAGNOSES   Final diagnoses:  Right hand paresthesia  Carpal tunnel syndrome of right wrist  Cystitis     Rx / DC Orders    ED Discharge Orders          Ordered    ciprofloxacin (CIPRO) 500 MG tablet  2 times daily        12/02/23 2234             Note:  This document was prepared using Dragon voice recognition software and may include unintentional dictation errors.   Sharman Cheek, MD 12/02/23 2238

## 2024-03-17 ENCOUNTER — Encounter: Payer: Self-pay | Admitting: Emergency Medicine

## 2024-03-17 ENCOUNTER — Emergency Department
Admission: EM | Admit: 2024-03-17 | Discharge: 2024-03-17 | Disposition: A | Discharge status: Home / Self Care | Payer: MEDICAID | Attending: Emergency Medicine | Admitting: Emergency Medicine

## 2024-03-17 ENCOUNTER — Other Ambulatory Visit: Payer: Self-pay

## 2024-03-17 DIAGNOSIS — L02211 Cutaneous abscess of abdominal wall: Secondary | ICD-10-CM | POA: Insufficient documentation

## 2024-03-17 MED ORDER — DOXYCYCLINE HYCLATE 100 MG PO CAPS: 100.0000 mg | ORAL_CAPSULE | Freq: Two times a day (BID) | ORAL | 0 refills | Status: AC

## 2024-03-17 MED ORDER — HYDROCODONE-ACETAMINOPHEN 5-325 MG PO TABS: 1.0000 | ORAL_TABLET | Freq: Four times a day (QID) | ORAL | 0 refills | Status: AC | PRN

## 2024-03-17 MED ORDER — HYDROCODONE-ACETAMINOPHEN 5-325 MG PO TABS
1.0000 | ORAL_TABLET | Freq: Once | ORAL | Status: AC
Start: 2024-03-17 — End: 2024-03-17
  Administered 2024-03-17: 1 via ORAL
  Filled 2024-03-17: qty 1

## 2024-03-17 MED ORDER — LIDOCAINE HCL (PF) 1 % IJ SOLN
5.0000 mL | Freq: Once | INTRAMUSCULAR | Status: AC
  Administered 2024-03-17: 5 mL via INTRADERMAL

## 2024-03-17 NOTE — ED Triage Notes (Signed)
 Patient to ED via POV for abscess to the right abd. States ongoing since Saturday. Denies drainage. Unsure if she was bit by an insect.

## 2024-03-17 NOTE — ED Provider Notes (Signed)
 [facility] Provider Note    Event Date/Time   First MD Initiated Contact with Patient 03/17/24 [phone]     (approximate)   History   Abscess   HPI  [name] is a 38 y.o. female has a history of myasthenia, spina bifida with associated lower extremity weakness  Patient presents reports that she had been doing well but on Saturday noticed an area that she thought could have been a "bug bite" over her right upper abdomen.  Since then the area has become more solid tender, and sort of come to ahead.  She believes she may have an infection or an abscess.  She has had other similar smaller areas of infection over the last several months, but has never had to seek treatment form     Physical Exam   Triage Vital Signs: ED Triage Vitals  Encounter Vitals Group     BP 03/17/24 0810 (!) 169/101     Systolic BP Percentile --      Diastolic BP Percentile --      Pulse Rate 03/17/24 0810 93     Resp 03/17/24 0810 18     Temp 03/17/24 0810 99.3 F (37.4 C)     Temp Source 03/17/24 0810 Oral     SpO2 03/17/24 0810 100 %     Weight 03/17/24 0809 200 lb (90.7 kg)     Height 03/17/24 0809 5\' 1"  (1.549 m)     Head Circumference --      Peak Flow --      Pain Score 03/17/24 0809 10     Pain Loc --      Pain Education --      Exclude from Growth Chart --     Most recent vital signs: Vitals:   03/17/24 0810  BP: (!) 169/101  Pulse: 93  Resp: 18  Temp: 99.3 F (37.4 C)  SpO2: 100%     General: Awake, no distress.  Accompanied by her husband of 10 years.  She is very pleasant CV:  Good peripheral perfusion.  Resp:  Normal effort.  Abd:  No distention.  Soft nontender nondistended.  However she does have an area of discrete skin edema slight warmth to touch and a central furuncle.  The area of induration is approximately 3 x 4 cm and ovoid, with the carbuncle at the center.  It appears to have slightly unroofed or drained.  It does not feel to be  extremely deep in nature.  There is no surrounding skin necrosis no sloughing no blistering. Other:     ED Results / Procedures / Treatments   Labs (all labs ordered are listed, but only abnormal results are displayed) Labs Reviewed - No data to display   EKG     RADIOLOGY     PROCEDURES:  Critical Care performed: Yes  .Incision and Drainage  Date/Time: 03/17/2024 9:25 AM  Performed by: Sharyn Creamer, MD Authorized by: Sharyn Creamer, MD   Consent:    Consent obtained:  Verbal   Consent given by:  Patient   Risks discussed:  Bleeding, infection, pain and damage to other organs   Alternatives discussed:  No treatment (antibiotic only and observation) Universal protocol:    Procedure explained and questions answered to patient or proxy's satisfaction: yes     Patient identity confirmed:  Verbally with patient Location:    Type:  Abscess   Location: right upper abd wall anterior. Pre-procedure details:    Skin  preparation:  Chlorhexidine with alcohol Sedation:    Sedation type:  None Anesthesia:    Anesthesia method:  Local infiltration   Local anesthetic:  Lidocaine 1% w/o epi (3 ml) Procedure type:    Complexity:  Simple Procedure details:    Needle aspiration: no     Incision types:  Stab incision   Incision depth:  Dermal   Wound management:  Probed and deloculated   Drainage:  Purulent (5 ml blood, 2 ml purulunce max)   Drainage amount:  Scant   Wound treatment:  Wound left open   Packing materials:  None Post-procedure details:    Procedure completion:  Tolerated well, no immediate complications    MEDICATIONS ORDERED IN ED: Medications  lidocaine (PF) (XYLOCAINE) 1 % injection 5 mL (5 mLs Intradermal Given by Other 03/17/24 0836)  HYDROcodone-acetaminophen (NORCO/VICODIN) 5-325 MG per tablet 1 tablet (1 tablet Oral Given 03/17/24 0835)     IMPRESSION / MDM / ASSESSMENT AND PLAN / ED COURSE  I reviewed the triage vital signs and the nursing notes.                               Differential diagnosis includes, but is not limited to, possible area of cellulitis, abscess, carbuncle, or other acute skin infection.  Does not appear to represent hives or erythema.  There is no blistering.  She has no other areas of concern on her skin except she reports she had similar but smaller areas pop up in places that have now resolved over the last couple of months.  The central boil with surrounding base is concerning for possible MRSA type infection, though she is not a known carrier.  After an incision and drainage which was successful but yielded only a small amount of purulence, patient's bandaged and no further bleeding.  She is comfortable with plan for discharge will place on doxycycline.  I called and discussed and reviewed doxycycline with our pharmacist prior to prescribing given the fact that she does have myasthenia and also takes immunosuppressants.  No contraindication to noted  She has no evidence of septicemia or shock.  She is well-appearing at this time.  Traditional wound precautions and careful follow-up recommendations and signs and symptoms of worsening infection discussed with patient and husband who are in agreement  I will prescribe the patient a narcotic pain medicine due to their condition which I anticipate will cause at least moderate pain short term. I discussed with the patient safe use of narcotic pain medicines, and that they are not to drive, work in dangerous areas, or ever take more than prescribed (no more than 1 pill every 6 hours). We discussed that this is the type of medication that can be  overdosed on and the risks of this type of medicine. Patient is very agreeable to only use as prescribed and to never use more than prescribed.   Patient's presentation is most consistent with acute complicated illness / injury requiring diagnostic workup.          FINAL CLINICAL IMPRESSION(S) / ED DIAGNOSES   Final  diagnoses:  Abdominal wall abscess     Rx / DC Orders   ED Discharge Orders          Ordered    doxycycline (VIBRAMYCIN) 100 MG capsule  2 times daily        03/17/24 0911    HYDROcodone-acetaminophen (NORCO/VICODIN) 5-325 MG tablet  Every  6 hours PRN        03/17/24 0911             Note:  This document was prepared using Dragon voice recognition software and may include unintentional dictation errors.   Sharyn Creamer, MD 03/17/24 [phone]

## 2024-03-17 NOTE — ED Notes (Signed)
EDP at bedside performing I&D. 

## 2024-08-05 ENCOUNTER — Emergency Department (HOSPITAL_COMMUNITY)
Admission: EM | Admit: 2024-08-05 | Discharge: 2024-08-05 | Discharge status: Against Medical Advice | Payer: MEDICAID | Attending: Emergency Medicine | Admitting: Emergency Medicine

## 2024-08-05 ENCOUNTER — Emergency Department (HOSPITAL_COMMUNITY): Payer: MEDICAID

## 2024-08-05 ENCOUNTER — Other Ambulatory Visit: Payer: Self-pay

## 2024-08-05 ENCOUNTER — Encounter (HOSPITAL_COMMUNITY): Payer: Self-pay

## 2024-08-05 DIAGNOSIS — R519 Headache, unspecified: Secondary | ICD-10-CM | POA: Diagnosis not present

## 2024-08-05 DIAGNOSIS — R29810 Facial weakness: Secondary | ICD-10-CM | POA: Diagnosis present

## 2024-08-05 DIAGNOSIS — Z5321 Procedure and treatment not carried out due to patient leaving prior to being seen by health care provider: Secondary | ICD-10-CM | POA: Insufficient documentation

## 2024-08-05 LAB — COMPREHENSIVE METABOLIC PANEL WITH GFR
ALT: 11 U/L (ref 0–44)
AST: 12 U/L — ABNORMAL LOW (ref 15–41)
Albumin: 3.5 g/dL (ref 3.5–5.0)
Alkaline Phosphatase: 72 U/L (ref 38–126)
Anion gap: 9 (ref 5–15)
BUN: 7 mg/dL (ref 6–20)
CO2: 24 mmol/L (ref 22–32)
Calcium: 8.8 mg/dL — ABNORMAL LOW (ref 8.9–10.3)
Chloride: 105 mmol/L (ref 98–111)
Creatinine, Ser: 0.77 mg/dL (ref 0.44–1.00)
GFR, Estimated: >60 mL/min (ref >60)
Glucose, Bld: 198 mg/dL — ABNORMAL HIGH (ref 70–99)
Potassium: 3.2 mmol/L — ABNORMAL LOW (ref 3.5–5.1)
Sodium: 138 mmol/L (ref 135–145)
Total Bilirubin: 0.5 mg/dL (ref 0.0–1.2)
Total Protein: 6.9 g/dL (ref 6.5–8.1)

## 2024-08-05 LAB — HCG, SERUM, QUALITATIVE: Preg, Serum: NEGATIVE

## 2024-08-05 LAB — CBC
HCT: 36.6 % (ref 36.0–46.0)
Hemoglobin: 11.7 g/dL — ABNORMAL LOW (ref 12.0–15.0)
MCH: 23.7 pg — ABNORMAL LOW (ref 26.0–34.0)
MCHC: 32 g/dL (ref 30.0–36.0)
MCV: 74.2 fL — ABNORMAL LOW (ref 80.0–100.0)
Platelets: 384 K/uL (ref 150–400)
RBC: 4.93 MIL/uL (ref 3.87–5.11)
RDW: 15.6 % — ABNORMAL HIGH (ref 11.5–15.5)
WBC: 13.3 K/uL — ABNORMAL HIGH (ref 4.0–10.5)
nRBC: 0 % (ref 0.0–0.2)

## 2024-08-05 NOTE — ED Triage Notes (Signed)
 Pt states she was at work 3 days ago & she felt a pop right after her Lt eye began twitching. She then had family notice her Lt eye was drooping (she could not feel it happening). She does have Hx of myasthenia Gravis & has been without her medications for 1 week d/t financial issues. A/Ox4, does have a HA rates the pain 8/120 & the Rt eye sight is blurry.

## 2024-08-05 NOTE — ED Notes (Signed)
 Patient stating that she is leaving the hospital and will come back in the morning. RN explained the risks of leaving and patient stated she had been here since this morning and was hungry and going home.

## 2024-08-05 NOTE — ED Triage Notes (Signed)
 Pt states she thinks she is having myasthenia gravis flare; c/o r facial droop since Saturday; denies fevers; endorses HA and lethargy

## 2024-08-05 NOTE — ED Provider Triage Note (Signed)
 Emergency Medicine Provider Triage Evaluation Note  [name] , a 38 y.o. female  was evaluated in triage.  Pt complains of myasthenia flare, facial droop on right, right face twitching.  Patient reports that she had been out of her medication prior to a couple of days ago  Review of Systems  Positive: Face droop Negative: Trouble breathing  Physical Exam  BP (!) 145/110 (BP Location: Left Arm)   Pulse (!) 106   Temp 99.7 F (37.6 C)   Resp 18   Ht 5' 1 (1.549 m)   Wt 90.7 kg   SpO2 96%   BMI 37.79 kg/m  Gen:   Awake, no distress   Resp:  Normal effort  MSK:   Moves extremities without difficulty  Other:  Right sided facial droop  Medical Decision Making  Medically screening exam initiated at 6:35 PM.  Appropriate orders placed.  [name] was informed that the remainder of the evaluation will be completed by another provider, this initial triage assessment does not replace that evaluation, and the importance of remaining in the ED until their evaluation is complete.  Workup initiated in triage    [name] 08/05/24 1835

## 2024-08-06 ENCOUNTER — Other Ambulatory Visit: Payer: Self-pay

## 2024-08-06 ENCOUNTER — Emergency Department (HOSPITAL_COMMUNITY)
Admission: EM | Admit: 2024-08-06 | Discharge: 2024-08-06 | Disposition: A | Discharge status: Home / Self Care | Payer: MEDICAID | Attending: Emergency Medicine | Admitting: Emergency Medicine

## 2024-08-06 ENCOUNTER — Encounter (HOSPITAL_COMMUNITY): Payer: Self-pay

## 2024-08-06 DIAGNOSIS — G7001 Myasthenia gravis with (acute) exacerbation: Secondary | ICD-10-CM | POA: Diagnosis not present

## 2024-08-06 DIAGNOSIS — H02401 Unspecified ptosis of right eyelid: Secondary | ICD-10-CM | POA: Diagnosis present

## 2024-08-06 MED ORDER — POTASSIUM CHLORIDE CRYS ER 20 MEQ PO TBCR
40.0000 meq | EXTENDED_RELEASE_TABLET | Freq: Once | ORAL | Status: AC
  Administered 2024-08-06 (×2): 40 meq via ORAL
  Filled 2024-08-06: qty 2

## 2024-08-06 NOTE — ED Provider Notes (Signed)
 [place] EMERGENCY DEPARTMENT AT [facility] Provider Note   CSN: [id number] Arrival date & time: 08/06/24  9064     Patient presents with: No chief complaint on file.   [name] is a 38 y.o. female who has a known diagnosis of myasthenia gravis that presents to the ED today with right eyelid ptosis.  She denies having any other symptoms at present, is continuing to take her therapy for MG, including mycophenolate , prednisone , and pyridostigmine .  Her workup yesterday did demonstrate a leukocytosis of 13.3, however remainder of CBC was unremarkable compared to her baseline.  She also had a mild hypokalemia 3.2, and glucose is mildly elevated at 198 which is baseline for patient.  Renal function is unremarkable.   HPI     Prior to Admission medications   Medication Sig Start Date End Date Taking? Authorizing Provider  albuterol  (VENTOLIN  HFA) 108 (90 Base) MCG/ACT inhaler Inhale 2 puffs into the lungs every 6 (six) hours as needed for wheezing or shortness of breath. 03/07/22   Saunders Shona CROME, PA-C  doxycycline  (VIBRAMYCIN ) 100 MG capsule Take 1 capsule (100 mg total) by mouth 2 (two) times daily. 03/17/24   Dicky Anes, MD  gabapentin  (NEURONTIN ) 100 MG capsule Start with 100mg  three times daily by mouth for one week.  If symptoms are not improved, you may then increase the dose to 200mg  by mouth three times daily.  Remain at this dose until you follow up with primary care. 12/02/23   Viviann Pastor, MD  HYDROcodone -acetaminophen  (NORCO/VICODIN) 5-325 MG tablet Take 1 tablet by mouth every 6 (six) hours as needed for moderate pain (pain score 4-6). 03/17/24   Dicky Anes, MD  lisinopril  (ZESTRIL ) 10 MG tablet Take 2 tablets (20 mg total) by mouth daily. 06/11/19   Yisroel Sleight, MD  mycophenolate  (CELLCEPT ) 500 MG tablet Take 1,500 mg by mouth 2 (two) times daily.    [provider]  potassium chloride  SA (K-DUR,KLOR-CON ) 20 MEQ tablet Take 20 mEq by mouth  daily.  12/13/18   [provider]  predniSONE  (DELTASONE ) 5 MG tablet 3 pills daily 04/11/21   [provider]  pyridostigmine  (MESTINON ) 60 MG tablet Take 60 mg by mouth every 4 (four) hours.    [provider]  solifenacin (VESICARE) 10 MG tablet Take 20 mg by mouth daily.  11/05/18   [provider]    Allergies: Iodinated contrast media, Penicillins, Latex, and Metrizamide    Review of Systems  Neurological:  Positive for weakness.  All other systems reviewed and are negative.   Updated Vital Signs BP (!) 168/95 (BP Location: Left Arm)   Pulse 89   Temp 98.4 F (36.9 C) (Oral)   Resp 17   Ht 5' 1 (1.549 m)   Wt 90.7 kg   SpO2 97%   BMI 37.79 kg/m   Physical Exam Vitals and nursing note reviewed.  Constitutional:      General: She is not in acute distress.    Appearance: Normal appearance.  HENT:     Head: Normocephalic and atraumatic.     Mouth/Throat:     Mouth: Mucous membranes are moist.     Pharynx: Oropharynx is clear.  Eyes:     General: Vision grossly intact. Gaze aligned appropriately. No visual field deficit.    Extraocular Movements: Extraocular movements intact.     Conjunctiva/sclera: Conjunctivae normal.     Pupils: Pupils are equal, round, and reactive to light.     Comments:  Ptosis of the right eye is noted  Cardiovascular:     Rate and Rhythm: Normal rate and regular rhythm.     Pulses: Normal pulses.     Heart sounds: Normal heart sounds. No murmur heard.    No friction rub. No gallop.  Pulmonary:     Effort: Pulmonary effort is normal.     Breath sounds: Normal breath sounds.  Abdominal:     General: Abdomen is flat. Bowel sounds are normal.     Palpations: Abdomen is soft.  Musculoskeletal:        General: Normal range of motion.     Cervical back: Normal range of motion and neck supple.     Right lower leg: No edema.     Left lower leg: No edema.  Skin:    General: Skin is warm and dry.      Capillary Refill: Capillary refill takes less than 2 seconds.  Neurological:     General: No focal deficit present.     Mental Status: She is alert and oriented to person, place, and time. Mental status is at baseline.     GCS: GCS eye subscore is 4. GCS verbal subscore is 5. GCS motor subscore is 6.     Cranial Nerves: Cranial nerve deficit and facial asymmetry present. No dysarthria.     Sensory: Sensation is intact.     Motor: Motor function is intact.     Coordination: Coordination is intact.     Comments: Ptosis of the right eye is noted, her pupils are equal and reactive bilaterally to light and accommodation  Psychiatric:        Mood and Affect: Mood normal.     (all labs ordered are listed, but only abnormal results are displayed) Labs Reviewed  URINALYSIS, ROUTINE W REFLEX MICROSCOPIC    EKG: None  Radiology: DG Chest 1 View Result Date: 08/05/2024 CLINICAL DATA:  Shortness of breath EXAM: CHEST  1 VIEW COMPARISON:  Chest x-ray 03/07/2022 FINDINGS: The heart size and mediastinal contours are within normal limits. Both lungs are clear. The visualized skeletal structures are unremarkable. IMPRESSION: No active disease. Electronically Signed   By: Greig Pique M.D.   On: 08/05/2024 19:37     Procedures   Medications Ordered in the ED  potassium chloride  SA (KLOR-CON  M) CR tablet 40 mEq (40 mEq Oral Given 08/06/24 1310)                                    Medical Decision Making Amount and/or Complexity of Data Reviewed Labs: ordered.  Risk Prescription drug management.   Medical Decision Making:   [name] is a 38 y.o. female who presented to the ED today with ptosis of the right eye detailed above.    External chart has been reviewed including previous labs, imaging, and outpatient clinic records. Patient's presentation is complicated by their history of myasthenia gravis.  Complete initial physical exam performed, notably the patient  was alert and  oriented in no apparent distress.  Notable ptosis of the right eye is appreciated without any other neurologic deficits..    Reviewed and confirmed nursing documentation for past medical history, family history, social history.    Initial Assessment:   With the patient's presentation of ptosis of the right eye, most likely diagnosis is sequela of myasthenia gravis. Other diagnoses were considered including (but not limited to) acute neurovascular insult.  These are considered less likely due to history of present illness and physical exam findings.     Initial Plan:  As this patient had extensive workup undertaken yesterday, will defer any further blood workup and imaging at this time. Urinalysis with reflex culture ordered to evaluate for UTI or relevant urologic/nephrologic pathology.  Objective evaluation as below reviewed   Initial Study Results:   Laboratory  All laboratory results reviewed without evidence of clinically relevant pathology.   Exceptions include: Potassium from prior labs as noted at 3.2 reviewed  Radiology:  All images reviewed independently. Agree with radiology report at this time.   DG Chest 1 View Result Date: 08/05/2024 CLINICAL DATA:  Shortness of breath EXAM: CHEST  1 VIEW COMPARISON:  Chest x-ray 03/07/2022 FINDINGS: The heart size and mediastinal contours are within normal limits. Both lungs are clear. The visualized skeletal structures are unremarkable. IMPRESSION: No active disease. Electronically Signed   By: Greig Pique M.D.   On: 08/05/2024 19:37      Consults: Case discussed with Dr. Lindzen with neurology.   Reassessment and Plan:   After consulting with neurology on this patient, he recommends that patient is discharged and follow-up with outpatient neurology as she has isolated ptosis of the right eyelid without any other neurologic weakness at this time.  Encourage patient to follow-up with her primary care as well as she did have a decreased  potassium level and will need her labs redrawn in approximately 2 weeks to ensure that they have stabilized.  Discussed with patient follow-up with her neurologist as an outpatient, she understands and agrees has no further concerns at this time.       Final diagnoses:  Myasthenia gravis with acute exacerbation [facility])    ED Discharge Orders     None          Myriam Dorn BROCKS, [place] 08/06/24 1413    Randol Simmonds, MD 08/07/24 1421

## 2024-08-06 NOTE — ED Triage Notes (Signed)
 Pt presents from home C/O R eye droop X 3 days. Pt states she was seen yesterday at [facility] but left d/t wait time. Pt reports the PA in triage told her she was having myasthenia gravis flare up, but she did not stay for prescriptions. Denies new symptoms.

## 2024-08-06 NOTE — Group Note (Deleted)
 Date:  08/06/2024 Time:  2:22 PM  Group Topic/Focus:  Wellness Toolbox:   The focus of this group is to discuss various aspects of wellness, balancing those aspects and exploring ways to increase the ability to experience wellness.  Patients will create a wellness toolbox for use upon discharge.     Participation Level:  {BHH PARTICIPATION OZCZO:77735}  Participation Quality:  {BHH PARTICIPATION QUALITY:22265}  Affect:  {BHH AFFECT:22266}  Cognitive:  {BHH COGNITIVE:22267}  Insight: {BHH Insight2:20797}  Engagement in Group:  {BHH ENGAGEMENT IN HMNLE:77731}  Modes of Intervention:  {BHH MODES OF INTERVENTION:22269}  Additional Comments:  ***  Myra Curtistine BROCKS 08/06/2024, 2:22 PM

## 2024-08-11 ENCOUNTER — Emergency Department
Admission: EM | Admit: 2024-08-11 | Discharge: 2024-08-11 | Disposition: A | Discharge status: Home / Self Care | Payer: MEDICAID | Attending: Emergency Medicine | Admitting: Emergency Medicine

## 2024-08-11 ENCOUNTER — Other Ambulatory Visit: Payer: Self-pay

## 2024-08-11 ENCOUNTER — Emergency Department: Payer: MEDICAID

## 2024-08-11 DIAGNOSIS — E1165 Type 2 diabetes mellitus with hyperglycemia: Secondary | ICD-10-CM | POA: Insufficient documentation

## 2024-08-11 DIAGNOSIS — H5789 Other specified disorders of eye and adnexa: Secondary | ICD-10-CM | POA: Diagnosis present

## 2024-08-11 DIAGNOSIS — I10 Essential (primary) hypertension: Secondary | ICD-10-CM | POA: Insufficient documentation

## 2024-08-11 DIAGNOSIS — H02401 Unspecified ptosis of right eyelid: Secondary | ICD-10-CM | POA: Insufficient documentation

## 2024-08-11 DIAGNOSIS — H04001 Unspecified dacryoadenitis, right lacrimal gland: Secondary | ICD-10-CM

## 2024-08-11 LAB — BASIC METABOLIC PANEL WITH GFR
Anion gap: 11 (ref 5–15)
BUN: 8 mg/dL (ref 6–20)
CO2: 23 mmol/L (ref 22–32)
Calcium: 8.6 mg/dL — ABNORMAL LOW (ref 8.9–10.3)
Chloride: 99 mmol/L (ref 98–111)
Creatinine, Ser: 0.65 mg/dL (ref 0.44–1.00)
GFR, Estimated: >60 mL/min (ref >60)
Glucose, Bld: 275 mg/dL — ABNORMAL HIGH (ref 70–99)
Potassium: 4 mmol/L (ref 3.5–5.1)
Sodium: 133 mmol/L — ABNORMAL LOW (ref 135–145)

## 2024-08-11 LAB — CBC WITH DIFFERENTIAL/PLATELET
Abs Immature Granulocytes: 0.13 K/uL — ABNORMAL HIGH (ref 0.00–0.07)
Basophils Absolute: 0.1 K/uL (ref 0.0–0.1)
Basophils Relative: 0 %
Eosinophils Absolute: 0 K/uL (ref 0.0–0.5)
Eosinophils Relative: 0 %
HCT: 37.1 % (ref 36.0–46.0)
Hemoglobin: 11.8 g/dL — ABNORMAL LOW (ref 12.0–15.0)
Immature Granulocytes: 1 %
Lymphocytes Relative: 19 %
Lymphs Abs: 2.3 K/uL (ref 0.7–4.0)
MCH: 23.6 pg — ABNORMAL LOW (ref 26.0–34.0)
MCHC: 31.8 g/dL (ref 30.0–36.0)
MCV: 74.3 fL — ABNORMAL LOW (ref 80.0–100.0)
Monocytes Absolute: 0.6 K/uL (ref 0.1–1.0)
Monocytes Relative: 5 %
Neutro Abs: 9.2 K/uL — ABNORMAL HIGH (ref 1.7–7.7)
Neutrophils Relative %: 75 %
Platelets: 400 K/uL (ref 150–400)
RBC: 4.99 MIL/uL (ref 3.87–5.11)
RDW: 15.3 % (ref 11.5–15.5)
WBC: 12.2 K/uL — ABNORMAL HIGH (ref 4.0–10.5)
nRBC: 0 % (ref 0.0–0.2)

## 2024-08-11 MED ORDER — CEPHALEXIN 500 MG PO CAPS: 500.0000 mg | ORAL_CAPSULE | Freq: Four times a day (QID) | ORAL | 0 refills | Status: AC

## 2024-08-11 MED ORDER — CEPHALEXIN 500 MG PO CAPS
500.0000 mg | ORAL_CAPSULE | Freq: Once | ORAL | Status: AC
  Administered 2024-08-11: 500 mg via ORAL
  Filled 2024-08-11: qty 1

## 2024-08-11 NOTE — ED Provider Notes (Signed)
 Clinical Course as of 08/11/24 1723  Mon Aug 11, 2024  1513 Hx of myasthenia gravis. Had ptosis 10 days ago. Went to neurology today. Wasn't sure if this was MG flare. Blurry vision is not new.  [HD]  1523  Nonspecific asymmetric prominence of the right lacrimal gland. This could reflect dacryoadenitis in the appropriate clinical setting. However, a lacrimal gland mass cannot be excluded and close clinical follow-up is recommended (with imaging follow-up as warranted).   [HD]  1526 Epic chat message sent to oncall unassigned optho for followup and recommendations [HD]  1534 Case discussed with Dr. Mittie of ophthalmology.  He can see the patient this week.  He recommends starting the patient on Augmentin if not allergic.  He does recommend the patient get an outpatient MRI of the orbits [HD]  1546 Patient is allergic to Augmentin but has tolerated Keflex  in the past.  According to literature review this is okay and myasthenia gravis as long as it has been tolerated.  Patient doing well and I reviewed the results.  Patient is to follow-up with Dr. Randell Tian's office this week and I will give her the number to call for an appointment.  Will send a short prescription of Keflex  to the patient's pharmacy of choice.  Return precautions given and patient and husband voiced understanding [HD]    Clinical Course User Index [HD] Nicholaus Rolland BRAVO, MD   At time of discharge there is no evidence of acute life, limb, vision, or fertility threat. Patient has stable vital signs, pain is well controlled, patient is ambulatory and p.o. tolerant.  Discharge instructions were completed using the EPIC system. I would refer you to those at this time. All warnings prescriptions follow-up etc. were discussed in detail with the patient. Patient indicates understanding and is agreeable with this plan. All questions answered.  Patient is made aware that they may return to the emergency department for any worsening  or new condition or for any other emergency.    Nicholaus Rolland BRAVO, MD 08/11/24 [phone]

## 2024-08-11 NOTE — ED Provider Notes (Signed)
 [facility] Provider Note    Event Date/Time   First MD Initiated Contact with Patient 08/11/24 1145     (approximate)   History   Eye Problem   HPI  [name] is a 38 year old female with history of T2DM, myasthenia gravis, HTN presenting to the emergency department for evaluation of right eyelid drooping.  Patient reports she first noticed this over a week ago.  She had follow-up with her neurologist today where it was felt that her ptosis did not seem to be related to her myasthenia gravis.  Her neurologist recommended urgent care or primary care follow-up.  Went to urgent care, but was directed to the ER as they do not have advanced imaging.  I reviewed her ER visit at [facility] from 08/06/2024.  At that time she was identified to have isolated ptosis. Case was reviewed with neurology who recommended outpatient follow-up.I also reviewed her outpatient neurology visit from today in the [facility] system.  There, she was noted to have intact eyelid strength which reflected possible alternative etiologies of her ptosis as opposed to MG.  There was consideration of possible stye versus infection versus fluid which may be causing her symptoms.  In the setting of this, she was instructed to follow-up with ophthalmology versus her primary care doctor versus urgent care.     Physical Exam   Triage Vital Signs: ED Triage Vitals  Encounter Vitals Group     BP 08/11/24 1050 (!) 149/92     Girls Systolic BP Percentile --      Girls Diastolic BP Percentile --      Boys Systolic BP Percentile --      Boys Diastolic BP Percentile --      Pulse Rate 08/11/24 1050 77     Resp 08/11/24 1050 16     Temp 08/11/24 1050 98.4 F (36.9 C)     Temp Source 08/11/24 1050 Oral     SpO2 08/11/24 1050 99 %     Weight 08/11/24 1053 211 lb (95.7 kg)     Height 08/11/24 1053 5' 1 (1.549 m)     Head Circumference --      Peak Flow --      Pain Score 08/11/24 1051 8     Pain  Loc --      Pain Education --      Exclude from Growth Chart --     Most recent vital signs: Vitals:   08/11/24 1050  BP: (!) 149/92  Pulse: 77  Resp: 16  Temp: 98.4 F (36.9 C)  SpO2: 99%     General: Awake, interactive  HEENT: Right eyelid drooping present, but able to tightly close her eyelids.  Intact extraocular movements.  Pupils equal and reactive.  IOP 20-22 bilaterally.  There is a small area of mild swelling over the superior aspect of the eyelid which may be reflective of a small stye, but does not seem large enough to cause significant ptosis. CV:  Regular rate, good peripheral perfusion.  Resp:  Unlabored respirations.  Abd:  Nondistended.  Neuro:  Symmetric facial movement, fluid speech, 5-5 strength of the bilateral upper and lower extremities, no gross facial asymmetry   ED Results / Procedures / Treatments   Labs (all labs ordered are listed, but only abnormal results are displayed) Labs Reviewed  CBC WITH DIFFERENTIAL/PLATELET - Abnormal; Notable for the following components:      Result Value   WBC 12.2 (*)  Hemoglobin 11.8 (*)    MCV 74.3 (*)    MCH 23.6 (*)    Neutro Abs 9.2 (*)    Abs Immature Granulocytes 0.13 (*)    All other components within normal limits  BASIC METABOLIC PANEL WITH GFR - Abnormal; Notable for the following components:   Sodium 133 (*)    Glucose, Bld 275 (*)    Calcium 8.6 (*)    All other components within normal limits  POC URINE PREG, ED     EKG EKG independently reviewed and interpreted by myself demonstrates:    RADIOLOGY Imaging independently reviewed and interpreted by myself demonstrates:  CT orbits pending  Formal Radiology Read:  CT Orbits Wo Contrast Result Date: 08/11/2024 CLINICAL DATA:  Provided history: New onset ptosis, evaluate mechanical cause. Additional history obtained from electronic MEDICAL RECORD NUMBERSwollen right eyelid, pocket of fluid versus inflammation in upper right crease noted on  examination. EXAM: CT ORBITS WITHOUT CONTRAST TECHNIQUE: Multidetector CT imaging of the orbits was performed using the standard protocol without intravenous contrast. Multiplanar CT image reconstructions were also generated. RADIATION DOSE REDUCTION: This exam was performed according to the departmental dose-optimization program which includes automated exposure control, adjustment of the mA and/or kV according to patient size and/or use of iterative reconstruction technique. COMPARISON:  Maxillofacial CT 06/19/2018. FINDINGS: Orbits: The right lacrimal gland is asymmetrically prominent but homogeneously enhancing (for instance as seen on series 2, image 21). Otherwise unremarkable appearance of the globes and orbits. The globes are normal in size and contour. The extraocular muscles and optic nerve sheath complexes are symmetric and unremarkable. Visible paranasal sinuses: No significant paranasal sinus disease at the imaged levels. Soft tissues: Asymmetric prominence of the right lacrimal gland as described above. The periorbital soft tissues are otherwise unremarkable. Osseous: Chronic, mildly displaced bilateral nasal bone fracture deformities. No acute fracture or aggressive osseous lesion. Limited intracranial: No evidence of an acute intracranial abnormality within the field of view. IMPRESSION: 1. Nonspecific asymmetric prominence of the right lacrimal gland. This could reflect dacryoadenitis in the appropriate clinical setting. However, a lacrimal gland mass cannot be excluded and close clinical follow-up is recommended (with imaging follow-up as warranted). 2. Otherwise unremarkable CT appearance of the orbits. 3. Chronic, mildly displaced bilateral nasal bone fracture deformities. Electronically Signed   By: Rockey Childs D.O.   On: 08/11/2024 15:17    PROCEDURES:  Critical Care performed: No  Procedures   MEDICATIONS ORDERED IN ED: Medications - No data to display   IMPRESSION / MDM /  ASSESSMENT AND PLAN / ED COURSE  I reviewed the triage vital signs and the nursing notes.  Differential diagnosis includes, but is not limited to, ptosis secondary to anatomical displacement, infection, myasthenia gravis flare, no other focal deficits and well outside the window for stroke intervention  Patient's presentation is most consistent with acute presentation with potential threat to life or bodily function.  38 year old female presenting with right-sided ptosis for over a week, sent in by her neurologist with concerns for nonneurologic causes of her ptosis.  Stable vitals on presentation here.  Labs with mild extents with WC of 12.2, stable anemia, BMP with elevated glucose at 275, no evidence of DKA.  Will order CT of the orbits to further evaluate.  Patient does have a contrast allergy, so we will plan for noncontrast study.  Signed out to oncoming physician pending CT and disposition.   FINAL CLINICAL IMPRESSION(S) / ED DIAGNOSES   Final diagnoses:  Acquired ptosis of  right eyelid     Rx / DC Orders   ED Discharge Orders     None        Note:  This document was prepared using Dragon voice recognition software and may include unintentional dictation errors.   Levander Slate, MD 08/11/24 [phone]

## 2024-08-11 NOTE — Discharge Instructions (Signed)
 You were seen in the emergency department for right eye proptosis.  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  Your case was discussed with ophthalmology and we have started you on antibiotics.  Keflex  is generally tolerated well in patients with myasthenia gravis however if you ever develop worsening symptoms please discontinue this antibiotic and talk to your neurologist or return to our facility.  Please call the ophthalmology office tomorrow and states that your case was discussed with Dr. Mittie and he wants to see you in clinic this week.  Return with any acutely worsening symptoms or any other emergency.  It was very nice meeting you and I wish you the best of luck -- RETURN PRECAUTIONS & AFTERCARE: (ENGLISH) RETURN PRECAUTIONS: Return immediately to the emergency department or see/call your doctor if you feel worse, weak or have changes in speech or vision, are short of breath, have fever, vomiting, pain, bleeding or dark stool, trouble urinating or any new issues. Return here or see/call your doctor if not improving as expected for your suspected condition. FOLLOW-UP CARE: Call your doctor and/or any doctors we referred you to for more advice and to make an appointment. Do this today, tomorrow or after the weekend. Some doctors only take PPO insurance so if you have HMO insurance you may want to contact your HMO or your regular doctor for referral to a specialist within your plan. Either way tell the doctor's office that it was a referral from the emergency department so you get the soonest possible appointment.  YOUR TEST RESULTS: Take result reports of any blood or urine tests, imaging tests and EKG's to your doctor and any referral doctor. Have any abnormal tests repeated. Your doctor or a referral doctor can let you know when this should be done. Also make sure your doctor contacts this hospital to get any test results that are not  currently available such as cultures or special tests for infection and final imaging reports, which are often not available at the time you leave the ER but which may list additional important findings that are not documented on the preliminary report. BLOOD PRESSURE: If your blood pressure was greater than 120/80 have your blood pressure rechecked within 1 to 2 weeks. MEDICATION SIDE EFFECTS: Do not drive, walk, bike, take the bus, etc. if you have received or are being prescribed any sedating medications such as those for pain or anxiety or certain antihistamines like Benadryl . If you have been give one of these here get a taxi home or have a friend drive you home. Ask your pharmacist to counsel you on potential side effects of any new medication

## 2024-08-11 NOTE — Progress Notes (Signed)
 Outpatient Neurology Consult Note   [facility] [address] [facility] [address] [place] [name] [place] [address] [phone]  Date: 08/11/24 Patient Name: [name] MRN: [id number] PCP: Agustina Greig Bills, MD    ASSESSMENT     [name] is a 38 year old woman with a long history of myasthenia gravis that appears under reasonable control although she is not being very compliant with her medications, taking half the dose of prescribed CellCept  and minimal Mestinon  and higher dose of prednisone  than initially recommended.  However, she is stable.  Her diabetes is under poor control recently.  She is gaining weight.  In addition she has neurogenic bladder and flail feet secondary to her previous spina bifida which is stable per patient.  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  Today, patient is here with acute right ptosis that started last week. She also feels like she is having jaw fatigue and a transient swallowing issue. She reports compliance with her cellcept , mestinon , and prednisone . She has not yet started the prednisone  taper. On exam today, there is a notable area of swelling in the right upper crease of her eyelid and appears as a pocket of inflammation vs fluid maybe from a stye. This area of swelling appears to be pushing her eyelid down and may be the cause of her ptosis rather than her myasthenia gravis. Her eyelid strength is intact which also makes me think this is not myasthenia. Otherwise, her exam is stable from her previous visit.     PLAN    Continue taking prednisone  15 mg daily - we will attempt to taper this but only after she takes the cellcept  and mestinon  consistently. We discussed that it will be difficult to wean the prednisone  without the other medications, namely cellcept , being consistently taken to maintain immunosuppression. Given her acute right eye issue, we will postpone the  prednisone  taper until her eye clears up, these instructions were provided to the patient.  Continue taking cellcept  1500 mg twice daily consistently as well as Mestinon  60 mg at least three times daily but can be taken every 4 hours while awake if needed Recommended the patient see her ophthalmologist asap to evaluate her right eye or her PCP or even go to urgent care to have this further evaluated.  Follow up in 3 months to evaluate progress with prednisone  taper and medication compliance.   I personally spent 46 minutes face-to-face and non-face-to-face in the care of this patient, which includes all pre, intra, and post visit time on the date of service.  All documented time was specific to the E/M visit and does not include any procedures that may have been performed.  Sharyne Pines, MSN, AGACNP-BC [facility] Neuromuscular [facility]     HISTORY    Chief Concern: [name] is a 38 y.o. female who comes for consultation and evaluation from Agustina Greig Bills, MD regarding Myasthenia gravis without exacerbation  Interval History 08/11/24: Patient reports that last week her right eyelid felt like it was fluttering and then she heard and felt a pop. Shortly after that she started noticing her right eyelid was drooping and then went to the ER to be evaluated on 8/13. She was told she was having a myasthenia gravis flare and needed to see her neurologist. She denies pain in the eye and no drainage or redness that she has noticed. She was concerned that maybe she had a stye but warm  compresses did not help. She also feels like her jaw has been hurting when she chews and her jaw muscle also feels tired when she chews. She reports a brief episode of feeling like she could not swallow, like there was a ball sitting in her throat but this has passed. She denies any double vision and has not had any drooping in the left eye. She reports compliance with her medications, including  cellcept , mestinon , and prednisone . She has not yet started the prednisone  taper and instructed at her previous visit.   Interval History 06/20/2024: Since her last visit, the patient reports that she continues to take her prednisone  15 mg daily but takes her cellcept  and mestinon  sporadically. When asked why she doesn't take her medications consistently she reports that sometimes she just doesn't want to because she doesn't like to take medicine. She reports that she does notice that her MG symptoms are better controlled when she takes her medications more consistently but does have flare up of symptoms when she misses multiple doses. She reports that her PCP would like her to come off prednisone  as she feels that her diabetes was induced by steroid use and it has been difficult to maintain adequate glucose control. The patient feels like the prednisone  is what works best to keep her MG symptoms controlled. No changes to her medical history since last visit.   History of Present Illness: The history is obtained from the medical record, patient, and her husband who is here with patient today.   She carries a diagnosis of myasthenia gravis since age 70 when she first noted droopiness of the eyes and double vision.  She also had slurred speech and some chewing difficulty.  There was a feeling of something stuck behind her throat although she did not have any swallowing problems.  She did not have choking or shortness of breath.  She does not describe significant weakness although she has had weakness in her legs since about from spina bifid for which she has had multiple surgeries in the past.  She has been wearing braces to walk all her life.    Her myasthenia gravis has been under fair control for over a year when she stopped seeing Dr. Kayla who retired.  She has been taking the same medications including prednisone  15 mg a day, CellCept  1500 mg a day, and pyridostigmine  1 tablet a day.  She was supposed  to be taking prednisone  15 mg every other day, CellCept  1500 mg twice daily, and pyridostigmine  1 every 4 hours.  However, she has not been doing this regimen mainly for convenience reasons.  Her last myasthenia exacerbation was January 2021 when she had recurrent double vision primarily due to noncompliance with her medications.  She has never had myasthenic crisis or required IVIG or plasma exchange.  She has not had thymectomy.  At her worst, she has had double vision and droopy eyelids with some weakness in her arms (her husband helps with washing her hair).  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  Currently she feels well.  Recently she has been discovered to have worsening diabetes which led to concern for her prednisone  dose making her diabetes worse that led to this visit.  She has recently been started on Ozempic.  Her last hemoglobin A1c was 9.8.    She has had low back pain radiating  down to both legs for several years.  She uses braces and a cane and a wheelchair for long distances.  She can walk about half a mile using a cane. Her sciatica pain gets worse with walking long distances.  Sometimes her arms will tingle and fingers will go numb.  This happens twice a week.  She sometimes wakes up with tingling in her hands and has to shake her hands to feel better.    She has been using a catheter for her bladder incontinence all her life.  Her bowels are fine although she loses control at times.  She has had 12 surgeries in her low back for spinal bifid or her feet.    MG ADL:    Grade 0 Grade 1 Grade 2 Grade 3 Score (0,1,2,3)  Double Vision Normal Occurs, but not daily Daily, but not constant Constant 0  Ptosis Normal Occurs, but not daily Daily, but not constant Constant 1   Talking Normal  Intermittent slurring of nasal speech Constant slurring or nasal, but can be  understood Difficult to understand Speech 0  Chewing Normal Fatigue with solid food Fatigue with soft food Gastric tube 1 - meat is difficult  Swallowing Normal Rare episode of choking Frequent choking necessitating changes in diet  Gastric tube 1  Shortness of breath Normal Shortness of breath with exertion Shortness of breath at rest Ventilator dependence 1  Impairment of ability to brush teeth or comb hair None Extra effort, but no rest periods needed Rest periods needed Cannot do one of these functions Cannot do one of these functions 1  Impairment of ability to arise from a chair None Mild, sometimes uses arms  Moderate, always uses arms  Severe, requires assistance 0 Cane bifida use a cane  Total MG-ADL SCORE (items 1-8)     3->6 (scores are based on her intermittent medication usage, no symptom free days)    Diabetes on and off 4 years.  MG good days.  Husband does household stuff.  Using a cane for several years.    Outside records and prior workup has been reviewed (see Media section).   Past Medical History: She  has a past medical history of Myasthenia gravis and Spina bifida.  HTN, Diabetes.    Family History: Her family history includes Lupus in her mother. Mother died at age 63 lupus complications- adapted mother. Father was on dialysis.  She learnt about him in his dying days.  Career of sickel cell. With adapted parents since age 41.  Social history: She  reports that she has never smoked. She has never used smokeless tobacco. She reports current alcohol use. She reports that she does not use drugs. Social drinking.  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   Medications: She is on the following medications: has a current medication list which includes the following prescription(s): famotidine , gabapentin , ketoconazole , lisinopril , loratadine , losartan, miscellaneous medical supply, mycophenolate , pnv 119-iron fum-folic acid, potassium chloride , prednisone ,  pyridostigmine , semaglutide, and solifenacin.  Medication Reconciliation: In conjunction with entering the patient's initial or transfer medications, I performed medication reconciliation based on information available to me at the time.  Allergies: She is allergic to iodinated contrast media, penicillins, latex, metrizamide, and penicillin g.  Review of Systems: 11+ review of systems was reviewed. Please see scanned document for details. Significant positives and negatives are detailed in the HPI.  Weight gain 215 lbs.  Was 2006 months ago. 190 in 1-2 year ago.  No sleep apnea  Data Review:  EXAMINATION   Vital Signs :Her height is 152.4 cm (5') and weight is 99.3 kg (219 lb). Her temporal temperature is 35.7 C (96.3 F). Her blood pressure is 152/111 and her pulse is 82.   Pain score- There were no vitals filed for this visit. /10  Physical Examination: General: Well developed, well-nourished in no acute distress. HEENT: normocephalic, atraumatic, no oral lesions or tongue lacerations, mucous membranes are moist, right eyelid is swollen, pocket of fluid vs inflammation in upper right crease, no redness or drainage, no pain with palpation, no obvious mass or lesion  CV: RRR with no murmer appreciated. Lungs: CTA bilaterally, no significant wheezes, rhonchi, or rales. Ext: No edema, nontender, no ulcer. Warm, well perfused. There is no evidence of pes   cavus or hammer toes.  Skin: No significant rashes. Neck: Supple.   Neurological Examination:  Mental Status:  Patient is awake, alert, and appropriately oriented. Attentive to examiner, cooperative with exam. Language is fluent with normal comprehension and repetition. Fund of knowledge is normal. Memory is intact. Patient is able to give details of her own history.  Speech is normal without any evidence of dysarthria.  Cranial nerves:   II: Visual fields are full. Pupils are equal, round and reactive to light. III, IV and VI:  extraocular movements are full. There is no nystagmus. V: Facial sensation is intact and symmetric. VII: The face is strong and symmetric. VIII: Hearing is grossly intact. IX and X: Soft palate elevates symmetrically in the midline. XI: Shoulder shrug and sternocleidomastoids are strong. XII: Tongue is midline, normal movement, no atrophy or fasciculations.  Motor Examination:  Muscle tone is normal.  There is no evidence of atrophy or fasciculations.  Strength evaluation:    MUSCLE Right (MRC Grade) Left (MRC  Grade) Right (lbs) Left (lbs)  Neck Flexor 5     Neck Extensor 5     Arm abductor 5 5    Elbow extensor 5 5    Elbow flexor  5 5    Wrist extensor 5 5    Wrist flexor 5 5    Finger flexor  5 5    Finger extensor 5 5    Finger abduction 5 5    Thumb abduction 5 5    Hip flexor  5 5    Hip extensor  5 5    Knee extensor  5 5    Knee flexor  5 4    Ank Dorsiflexor  0 0    Ank Plantarflexor  0 0    Toe Extensors 0 0    Toe Flexors 0 0    Glu max/med 5/5 (prior assessment, not assessed today) 4/5 (prior assessment, not assessed today)    Reflexes:  Reflexes Right Left Comments  Biceps 2+ 0   Triceps 2+ 2+   Brachioradialis 2+ 0   Patella 2+ 2+   Achilles 0 0   Plantar Response Flexor Flexor    Sensory (from prior visit, not assessed today given acute visit): Pinprick sensation is reduced up to ankle. Temperature sensation is reduced up to ankle. Vibration sense is absent at the toes and present at the MM. Proprioception is normal at the toes. There is no sensory level noted.  Coordination and Gait (from prior visit, not assessed today given acute visit): Finger to nose is intact without dysmetria or tremors. Rapid alternating movements are intact without dysrhythmia. Fine motor control is normal.   Romberg sign is difficult to check since  she is unstable with eyes open.  Gait is unsteady.  Tandem gait is not possible. She is unable to rise on toes and heels.   She is able to get up from a low chair without difficulty.  Arm abduction time is 3 minutes (prior visit) She can count to 22 in one breath (prior visit)

## 2024-08-11 NOTE — ED Triage Notes (Addendum)
 Pt arrives via POV with c/o a mass/pocket behind their right eye per their neurologist that is causing their right eye to droop. Pt denies any pain or drainage from that eye. Pt was sent here because neurologist was not able to due further testing. Pt states that the pain started 2 Saturdays ago while they were at work. Pt is A&Ox4 and ambulatory during triage.

## 2024-08-12 ENCOUNTER — Other Ambulatory Visit: Payer: Self-pay | Admitting: Ophthalmology

## 2024-08-12 ENCOUNTER — Other Ambulatory Visit
Admission: RE | Admit: 2024-08-12 | Discharge: 2024-08-12 | Disposition: A | Discharge status: Home / Self Care | Payer: MEDICAID | Attending: Ophthalmology | Admitting: Ophthalmology

## 2024-08-12 DIAGNOSIS — H02401 Unspecified ptosis of right eyelid: Secondary | ICD-10-CM

## 2024-08-12 DIAGNOSIS — H04001 Unspecified dacryoadenitis, right lacrimal gland: Secondary | ICD-10-CM | POA: Diagnosis present

## 2024-08-13 ENCOUNTER — Ambulatory Visit
Admission: RE | Admit: 2024-08-13 | Discharge: 2024-08-13 | Disposition: A | Discharge status: Home / Self Care | Payer: MEDICAID | Source: Ambulatory Visit | Attending: Ophthalmology | Admitting: Ophthalmology

## 2024-08-13 DIAGNOSIS — H02401 Unspecified ptosis of right eyelid: Secondary | ICD-10-CM | POA: Insufficient documentation

## 2024-08-13 LAB — ANGIOTENSIN CONVERTING ENZYME: Angiotensin-Converting Enzyme: 52 U/L (ref 14–82)

## 2024-08-13 LAB — ANCA PROFILE
Anti-MPO Antibodies: <0.2 U (ref 0.0–0.9)
Anti-PR3 Antibodies: <0.2 U (ref 0.0–0.9)
Atypical P-ANCA titer: <1:20 {titer}
C-ANCA: <1:20 {titer}
P-ANCA: <1:20 {titer}

## 2024-08-13 LAB — ACETYLCHOLINE RECEPTOR, BINDING: Acety choline binding ab: <0.07 nmol/L (ref 0.00–0.24)

## 2024-08-13 MED ORDER — GADOBUTROL 1 MMOL/ML IV SOLN
9.0000 mL | Freq: Once | INTRAVENOUS | Status: AC | PRN
  Administered 2024-08-13: 9 mL via INTRAVENOUS

## 2024-08-14 LAB — QUANTIFERON-TB GOLD PLUS (RQFGPL)
QuantiFERON Mitogen Value: 2.14 [IU]/mL
QuantiFERON Nil Value: 0.04 [IU]/mL
QuantiFERON TB1 Ag Value: 0.05 [IU]/mL
QuantiFERON TB2 Ag Value: 0.05 [IU]/mL

## 2024-08-14 LAB — QUANTIFERON-TB GOLD PLUS: QuantiFERON-TB Gold Plus: NEGATIVE
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
## 2024-09-30 ENCOUNTER — Other Ambulatory Visit: Payer: Self-pay

## 2024-09-30 ENCOUNTER — Emergency Department
Admission: EM | Admit: 2024-09-30 | Discharge: 2024-09-30 | Disposition: A | Discharge status: Home / Self Care | Payer: MEDICAID | Attending: Emergency Medicine | Admitting: Emergency Medicine

## 2024-09-30 DIAGNOSIS — R519 Headache, unspecified: Secondary | ICD-10-CM | POA: Insufficient documentation

## 2024-09-30 DIAGNOSIS — L304 Erythema intertrigo: Secondary | ICD-10-CM | POA: Insufficient documentation

## 2024-09-30 DIAGNOSIS — E119 Type 2 diabetes mellitus without complications: Secondary | ICD-10-CM | POA: Diagnosis not present

## 2024-09-30 DIAGNOSIS — I10 Essential (primary) hypertension: Secondary | ICD-10-CM | POA: Diagnosis not present

## 2024-09-30 MED ORDER — NYSTATIN 100000 UNIT/GM EX POWD: 1.0000 | Freq: Three times a day (TID) | CUTANEOUS | 0 refills | Status: AC

## 2024-09-30 MED ORDER — KETOROLAC TROMETHAMINE 15 MG/ML IJ SOLN
15.0000 mg | Freq: Once | INTRAMUSCULAR | Status: AC
  Administered 2024-09-30: 15 mg via INTRAMUSCULAR
  Filled 2024-09-30: qty 1

## 2024-09-30 NOTE — ED Provider Notes (Signed)
 [facility] Provider Note    Event Date/Time   First MD Initiated Contact with Patient 09/30/24 1107     (approximate)   History   Headache   HPI  [name] is a 38 y.o. female with a past medical history of myasthenia gravis followed closely by neurology presents today for evaluation of headache.  Patient reports that she has had a droopy right eyelid for the past 8 weeks at which time she saw her neurologist who did not feel that she was having a myasthenia gravis flare.  She had an MRI of her orbits and was diagnosed with dacryocystitis.  She was placed on antibiotics which she does not feel helped.  She denies any visual changes.  She denies trouble breathing.  She denies weakness.  She reports that she has an appointment with her neurologist in 2 days.  Secondly, she reports that she has a yeast infection under both of her breasts.  Patient Active Problem List   Diagnosis Date Noted   UTI (urinary tract infection) 06/08/2019   Knee clicking 08/12/2018   Sickle cell trait 01/10/2018   Neurogenic bladder 12/27/2016   Neurogenic bowel 12/27/2016   Cerebral palsy (HCC) 12/12/2016   Class 2 obesity 12/12/2016   Encounter for surveillance of contraceptive pills 12/12/2016   Muscle twitch 10/30/2016   S/P cholecystectomy 03/27/2016   Cholecystitis 02/02/2016   Sepsis (HCC) 01/12/2016   Absolute anemia 08/17/2015   Hay fever 05/17/2015   Can't get food down 05/17/2015   Secondary diabetes mellitus (HCC) 03/15/2015   Other long term (current) drug therapy 04/20/2014   Long term current use of systemic steroids 04/20/2014   Controlled type 2 diabetes mellitus without complication (HCC) 03/17/2014   Clinical depression 01/19/2014   Adenopathy 01/19/2014   Constipation 08/12/2013   Spina bifida (HCC) 08/12/2013   Esophagitis, reflux 10/02/2011   Avitaminosis D 10/02/2011   Essential (primary) hypertension 05/15/2011   Myasthenia gravis (HCC)  12/06/2010          Physical Exam   Triage Vital Signs: ED Triage Vitals [09/30/24 1034]  Encounter Vitals Group     BP (!) 156/117     Girls Systolic BP Percentile      Girls Diastolic BP Percentile      Boys Systolic BP Percentile      Boys Diastolic BP Percentile      Pulse Rate 100     Resp 18     Temp 98.3 F (36.8 C)     Temp Source Oral     SpO2 95 %     Weight 200 lb (90.7 kg)     Height 5' 1 (1.549 m)     Head Circumference      Peak Flow      Pain Score 8     Pain Loc      Pain Education      Exclude from Growth Chart     Most recent vital signs: Vitals:   09/30/24 1255 09/30/24 1255  BP: (!) 160/115   Pulse: 96   Resp:  17  Temp:    SpO2: 100%     Physical Exam Vitals and nursing note reviewed.  Constitutional:      General: Awake and alert. No acute distress.    Appearance: Normal appearance. The patient is normal weight.  HENT:     Head: Normocephalic and atraumatic.     Mouth: Mucous membranes are moist.  Eyes:  General: PERRL. Normal EOMs        Right eye: No discharge.  Ptosis present.  No surrounding erythema or edema.  No chemosis       Left eye: No discharge.     Conjunctiva/sclera: Conjunctivae normal.  Cardiovascular:     Rate and Rhythm: Normal rate and regular rhythm.     Pulses: Normal pulses.  Pulmonary:     Effort: Pulmonary effort is normal. No respiratory distress.     Breath sounds: Normal breath sounds.  Abdominal:     Abdomen is soft. There is no abdominal tenderness. No rebound or guarding. No distention. Musculoskeletal:        General: No swelling. Normal range of motion.     Cervical back: Normal range of motion and neck supple.  Skin:    General: Skin is warm and dry.     Capillary Refill: Capillary refill takes less than 2 seconds.     Findings: No rash.  Neurological:     Mental Status: The patient is awake and alert.   Neurological: GCS 15 alert and oriented x3 Normal speech, no expressive or  receptive aphasia or dysarthria Cranial nerves II through XII intact, ptosis to right eye Normal visual fields 5 out of 5 strength in all 4 extremities with intact sensation throughout No extremity drift Normal finger-to-nose testing, no limb or truncal ataxia    ED Results / Procedures / Treatments   Labs (all labs ordered are listed, but only abnormal results are displayed) Labs Reviewed - No data to display   EKG     RADIOLOGY     PROCEDURES:  Critical Care performed:   Procedures   MEDICATIONS ORDERED IN ED: Medications  ketorolac  (TORADOL ) 15 MG/ML injection 15 mg (15 mg Intramuscular Given 09/30/24 1152)     IMPRESSION / MDM / ASSESSMENT AND PLAN / ED COURSE  I reviewed the triage vital signs and the nursing notes.   Differential diagnosis includes, but is not limited to, migraine, tension headache, myasthenia gravis flare, dacryocystitis, mass, venous sinus thrombosis.  Patient is awake and alert, hemodynamically stable and afebrile.  She does have ptosis of her right eye which she reports has been present for the past 8 or so weeks and for which she has already seen her neurologist who did not feel this was consistent with a myasthenia gravis flare.  She denies weakness or fatigue, denies any difficulty breathing or swallowing.  She is requesting food in the emergency department which was provided for her.  She has no focal neurological deficits besides her obvious ptosis.  She denies any visual changes, no diplopia.  She was treated with Toradol  with resolution of her headache.  She has follow-up with her neurologist in 2 days which I feel is appropriate timing given that the symptoms have been ongoing for 8 weeks.  She has not had any altered mental status, fever, photophobia, vomiting, confusion, orbital pain to suggest cavernous sinus thrombosis.  Patient's second complaint was yeast infection under her breasts bilaterally.  She does have erythematous rash  with satellite lesions to this area, suspicious for intertrigo.  She was started on nystatin powder.  She was instructed to keep this area clean and dry.  We discussed close outpatient follow-up and strict return precautions.  Patient plans to see her neurologist in 2 days.  She understands and agrees with plan.  She was discharged in stable condition.  Patient was discussed with Dr. Arlander who agrees with assessment and  plan.   Patient's presentation is most consistent with acute complicated illness / injury requiring diagnostic workup.    FINAL CLINICAL IMPRESSION(S) / ED DIAGNOSES   Final diagnoses:  Acute nonintractable headache, unspecified headache type  Intertrigo     Rx / DC Orders   ED Discharge Orders          Ordered    nystatin (MYCOSTATIN/NYSTOP) powder  3 times daily        09/30/24 1215             Note:  This document was prepared using Dragon voice recognition software and may include unintentional dictation errors.   Samyiah Halvorsen E, PA-C 09/30/24 1438    Arlander [place], MD 09/30/24 1438

## 2024-09-30 NOTE — Discharge Instructions (Signed)
 Please follow-up with your neurologist as scheduled.  Please return for any new, worsening, or change in symptoms or other concerns.  It was a pleasure caring for you today.

## 2024-09-30 NOTE — ED Triage Notes (Signed)
 Patient states she has a headache and also thinks she has a yeast infection under her breast; wants to be treated for both.

## 2024-12-20 ENCOUNTER — Emergency Department: Admission: EM | Admit: 2024-12-20 | Discharge: 2024-12-21 | Disposition: A | Payer: MEDICAID

## 2024-12-20 ENCOUNTER — Other Ambulatory Visit: Payer: Self-pay

## 2024-12-20 DIAGNOSIS — N189 Chronic kidney disease, unspecified: Secondary | ICD-10-CM | POA: Diagnosis not present

## 2024-12-20 DIAGNOSIS — E1122 Type 2 diabetes mellitus with diabetic chronic kidney disease: Secondary | ICD-10-CM | POA: Insufficient documentation

## 2024-12-20 DIAGNOSIS — I129 Hypertensive chronic kidney disease with stage 1 through stage 4 chronic kidney disease, or unspecified chronic kidney disease: Secondary | ICD-10-CM | POA: Diagnosis not present

## 2024-12-20 DIAGNOSIS — M5416 Radiculopathy, lumbar region: Secondary | ICD-10-CM | POA: Diagnosis not present

## 2024-12-20 DIAGNOSIS — M545 Low back pain, unspecified: Secondary | ICD-10-CM | POA: Diagnosis present

## 2024-12-20 MED ORDER — ACETAMINOPHEN 500 MG PO TABS
1000.0000 mg | ORAL_TABLET | Freq: Once | ORAL | Status: AC
  Administered 2024-12-21: 1000 mg via ORAL
  Filled 2024-12-20: qty 2

## 2024-12-20 MED ORDER — GABAPENTIN 100 MG PO CAPS
200.0000 mg | ORAL_CAPSULE | Freq: Once | ORAL | Status: AC
  Administered 2024-12-21: 200 mg via ORAL
  Filled 2024-12-20: qty 2

## 2024-12-20 MED ORDER — KETOROLAC TROMETHAMINE 15 MG/ML IJ SOLN
30.0000 mg | Freq: Once | INTRAMUSCULAR | Status: AC
  Administered 2024-12-21: 30 mg via INTRAMUSCULAR
  Filled 2024-12-20: qty 2

## 2024-12-20 MED ORDER — LIDOCAINE 5 % EX PTCH
1.0000 | MEDICATED_PATCH | CUTANEOUS | Status: DC
  Administered 2024-12-21: 1 via TRANSDERMAL
  Filled 2024-12-20: qty 1

## 2024-12-20 NOTE — ED Triage Notes (Signed)
 Pt to ED for intermittent shooting pains every 10 minuteslike lightning to back of R thigh down to knee since 1 week. Pt walks with a limp because of spina bifida. Also has MG and had 6 hour chemo tx since 5 days and they are trying to wean her off of prednisone . Pt states that the sitting for 6 hours exascerbated the pain. Pt has been taking naproxen, gabapentin  and Norco with no relief and she avoids NSAIDS.  Hx DM (from L/T prednisone ), MG, sickle trait, spina bifida.

## 2024-12-20 NOTE — ED Provider Notes (Signed)
 "  [facility] Provider Note    Event Date/Time   First MD Initiated Contact with Patient 12/20/24 2257     (approximate)   History   Leg Pain  Pt to ED for intermittent shooting pains every 10 minuteslike lightning to back of R thigh down to knee since 1 week. Pt walks with a limp because of spina bifida. Also has MG and had 6 hour chemo tx since 5 days and they are trying to wean her off of prednisone . Pt states that the sitting for 6 hours exascerbated the pain. Pt has been taking naproxen, gabapentin  and Norco with no relief and she avoids NSAIDS.  Hx DM (from L/T prednisone ), MG, sickle trait, spina bifida.    HPI [name] is a 38 y.o. female PMH multiple comorbidities including diabetes, CKD, myasthenia gravis on CellCept  and prednisone , hypertension, neurogenic bladder and bowel, spina bifida presents for evaluation of leg pain - Patient has noticed pain radiating from her right posterior buttock down her posterior thigh to about her knee over the past week.  Intermittently has a shooting lightening like sensation.  No preceding trauma.  No back pain.  Has found relief with gabapentin , less so with naproxen or Norco.  Feels that her knee sometimes feels weak and decided to come to the emergency department today because her symptoms have not been improving.   Outpatient records reviewed including neurology visit from 11/27/24.  Outpatient labs on 12/15/2024 with normal renal function.     Physical Exam   Triage Vital Signs: ED Triage Vitals  Encounter Vitals Group     BP 12/20/24 1820 (!) 172/109     Girls Systolic BP Percentile --      Girls Diastolic BP Percentile --      Boys Systolic BP Percentile --      Boys Diastolic BP Percentile --      Pulse Rate 12/20/24 1818 95     Resp 12/20/24 1818 20     Temp 12/20/24 1818 98.6 F (37 C)     Temp Source 12/20/24 1818 Oral     SpO2 12/20/24 1818 98 %     Weight 12/20/24 1825 200 lb (90.7 kg)      Height 12/20/24 1825 5' 1 (1.549 m)     Head Circumference --      Peak Flow --      Pain Score 12/20/24 1819 8     Pain Loc --      Pain Education --      Exclude from Growth Chart --     Most recent vital signs: Vitals:   12/20/24 1820 12/20/24 2351  BP: (!) 172/109 (!) 151/92  Pulse:  78  Resp:  17  Temp:    SpO2:  100%     General: Awake, no distress.  CV:  Good peripheral perfusion. RRR, RP 2+ Resp:  Normal effort. Abd:  No distention. Nontender to deep palpation throughout Back:  No midline back pain, no CVA tenderness RLE: + Straight leg raise.  Pedal pulse 2+.  Able to range knee without difficulty.  No deformities.  SI LT.   ED Results / Procedures / Treatments   Labs (all labs ordered are listed, but only abnormal results are displayed) Labs Reviewed - No data to display   EKG  N/a   RADIOLOGY N/a    PROCEDURES:  Critical Care performed: No  Procedures   MEDICATIONS ORDERED IN ED: Medications  lidocaine  (LIDODERM ) 5 %  1 patch (1 patch Transdermal Patch Applied 12/21/24 0013)  acetaminophen  (TYLENOL ) tablet 1,000 mg (1,000 mg Oral Given 12/21/24 0012)  ketorolac  (TORADOL ) 15 MG/ML injection 30 mg (30 mg Intramuscular Given 12/21/24 0012)  gabapentin  (NEURONTIN ) capsule 200 mg (200 mg Oral Given 12/21/24 0013)     IMPRESSION / MDM / ASSESSMENT AND PLAN / ED COURSE  I reviewed the triage vital signs and the nursing notes.                              DDX/MDM/AP: Differential diagnosis includes, but is not limited to, sciatica/lumbar radiculopathy, consider muscle strain, no clinical concern for underlying fracture or dislocation, exam and history not consistent with DVT.  No concern for spinal cord pathology at this time.  Plan: - Multimodal pain management - No negation for labs, imaging - Plan for PMD follow-up, can consider PT in the outpatient setting if not improving  Patient's presentation is most consistent with acute,  uncomplicated illness.  ED course below.  Feeling much better after Tylenol , Toradol , Lidoderm  patch, gabapentin  and requesting discharge home.  Rx for, Lidoderm  patch, Motrin , gabapentin .  Plan for PMD follow-up, can consider PT if ongoing.  ED return precautions in place.  Patient agrees with plan.  Clinical Course as of 12/21/24 0056  Sun Dec 21, 2024  0032 Patient feeling much better after pain medications and requesting discharge home [MM]    Clinical Course User Index [MM] Clarine Ozell LABOR, MD     FINAL CLINICAL IMPRESSION(S) / ED DIAGNOSES   Final diagnoses:  Lumbar radiculopathy     Rx / DC Orders   ED Discharge Orders          Ordered    acetaminophen  (TYLENOL ) 500 MG tablet  Every 6 hours PRN        12/21/24 0004    ibuprofen  (MOTRIN  IB) 200 MG tablet  Every 8 hours PRN        12/21/24 0004    lidocaine  (LIDODERM ) 5 %  Every 24 hours        12/21/24 0004    gabapentin  (NEURONTIN ) 100 MG capsule  3 times daily        12/21/24 0004             Note:  This document was prepared using Dragon voice recognition software and may include unintentional dictation errors.   Clarine Ozell LABOR, MD 12/21/24 [phone]  "

## 2024-12-21 ENCOUNTER — Telehealth: Payer: Self-pay | Admitting: Emergency Medicine

## 2024-12-21 MED ORDER — GABAPENTIN 100 MG PO CAPS: 200.0000 mg | ORAL_CAPSULE | Freq: Three times a day (TID) | ORAL | 0 refills | Status: AC

## 2024-12-21 MED ORDER — LIDOCAINE 5 % EX PTCH: 1.0000 | MEDICATED_PATCH | CUTANEOUS | 0 refills | Status: DC

## 2024-12-21 MED ORDER — GABAPENTIN 100 MG PO CAPS: 200.0000 mg | ORAL_CAPSULE | Freq: Three times a day (TID) | ORAL | 0 refills | Status: DC

## 2024-12-21 MED ORDER — IBUPROFEN 200 MG PO TABS: 600.0000 mg | ORAL_TABLET | Freq: Three times a day (TID) | ORAL | 2 refills | Status: AC | PRN

## 2024-12-21 MED ORDER — IBUPROFEN 200 MG PO TABS: 600.0000 mg | ORAL_TABLET | Freq: Three times a day (TID) | ORAL | 2 refills | Status: DC | PRN

## 2024-12-21 MED ORDER — ACETAMINOPHEN 500 MG PO TABS: 1000.0000 mg | ORAL_TABLET | Freq: Four times a day (QID) | ORAL | 2 refills | Status: AC | PRN

## 2024-12-21 MED ORDER — LIDOCAINE 5 % EX PTCH: 1.0000 | MEDICATED_PATCH | CUTANEOUS | 0 refills | Status: AC

## 2024-12-21 MED ORDER — ACETAMINOPHEN 500 MG PO TABS: 1000.0000 mg | ORAL_TABLET | Freq: Four times a day (QID) | ORAL | 2 refills | Status: DC | PRN

## 2024-12-21 NOTE — Telephone Encounter (Signed)
 Changed pharmacy to [facility] in [place] as her pharmacy is closed today

## 2024-12-21 NOTE — Discharge Instructions (Addendum)
 Your evaluation in the emergency department was most consistent with sciatica.  I prescribed you several medications to help with this.  Please follow-up with your primary care provider for reevaluation, and return to the emergency department with any new or worsening symptoms.
